# Patient Record
Sex: Female | Born: 1950 | Race: White | Hispanic: No | Marital: Married | Smoking: Never smoker
Health system: Southern US, Community
[De-identification: ages and names within clinical notes are randomized; demographics above are authoritative.]

## PROBLEM LIST (undated history)

## (undated) DIAGNOSIS — G4730 Sleep apnea, unspecified: Secondary | ICD-10-CM

## (undated) DIAGNOSIS — R011 Cardiac murmur, unspecified: Secondary | ICD-10-CM

## (undated) DIAGNOSIS — IMO0002 Reserved for concepts with insufficient information to code with codable children: Secondary | ICD-10-CM

## (undated) DIAGNOSIS — F419 Anxiety disorder, unspecified: Secondary | ICD-10-CM

## (undated) DIAGNOSIS — C569 Malignant neoplasm of unspecified ovary: Secondary | ICD-10-CM

## (undated) DIAGNOSIS — R6889 Other general symptoms and signs: Secondary | ICD-10-CM

## (undated) DIAGNOSIS — E039 Hypothyroidism, unspecified: Secondary | ICD-10-CM

## (undated) DIAGNOSIS — M1990 Unspecified osteoarthritis, unspecified site: Secondary | ICD-10-CM

## (undated) DIAGNOSIS — I10 Essential (primary) hypertension: Secondary | ICD-10-CM

## (undated) DIAGNOSIS — K3532 Acute appendicitis with perforation and localized peritonitis, without abscess: Secondary | ICD-10-CM

## (undated) HISTORY — PX: CARPAL TUNNEL RELEASE: SHX101

## (undated) HISTORY — PX: ABDOMINAL WOUND DEHISCENCE: SHX540

## (undated) HISTORY — PX: KNEE ARTHROSCOPY: SUR90

## (undated) SURGERY — APPENDECTOMY, LAPAROSCOPIC
Anesthesia: General

---

## 1998-03-18 ENCOUNTER — Other Ambulatory Visit: Admission: RE | Admit: 1998-03-18 | Discharge: 1998-03-18 | Payer: Self-pay | Admitting: Obstetrics and Gynecology

## 1998-09-16 ENCOUNTER — Other Ambulatory Visit: Admission: RE | Admit: 1998-09-16 | Discharge: 1998-09-16 | Payer: Self-pay | Admitting: Obstetrics and Gynecology

## 1999-03-30 ENCOUNTER — Other Ambulatory Visit: Admission: RE | Admit: 1999-03-30 | Discharge: 1999-03-30 | Payer: Self-pay | Admitting: Obstetrics and Gynecology

## 2000-01-31 DIAGNOSIS — C569 Malignant neoplasm of unspecified ovary: Secondary | ICD-10-CM

## 2000-01-31 HISTORY — DX: Malignant neoplasm of unspecified ovary: C56.9

## 2000-01-31 HISTORY — PX: ABDOMINAL HYSTERECTOMY: SHX81

## 2000-03-13 ENCOUNTER — Encounter (INDEPENDENT_AMBULATORY_CARE_PROVIDER_SITE_OTHER): Payer: Self-pay | Admitting: Specialist

## 2000-03-13 ENCOUNTER — Other Ambulatory Visit: Admission: RE | Admit: 2000-03-13 | Discharge: 2000-03-13 | Payer: Self-pay | Admitting: Obstetrics and Gynecology

## 2000-04-03 ENCOUNTER — Encounter: Payer: Self-pay | Admitting: *Deleted

## 2000-04-10 ENCOUNTER — Encounter (INDEPENDENT_AMBULATORY_CARE_PROVIDER_SITE_OTHER): Payer: Self-pay

## 2000-04-10 ENCOUNTER — Inpatient Hospital Stay (HOSPITAL_COMMUNITY): Admission: RE | Admit: 2000-04-10 | Discharge: 2000-04-13 | Payer: Self-pay | Admitting: *Deleted

## 2000-04-18 ENCOUNTER — Inpatient Hospital Stay (HOSPITAL_COMMUNITY): Admission: AD | Admit: 2000-04-18 | Discharge: 2000-04-18 | Payer: Self-pay | Admitting: Obstetrics and Gynecology

## 2000-06-06 ENCOUNTER — Encounter: Payer: Self-pay | Admitting: *Deleted

## 2000-06-06 ENCOUNTER — Encounter: Admission: RE | Admit: 2000-06-06 | Discharge: 2000-06-06 | Payer: Self-pay | Admitting: *Deleted

## 2000-09-06 ENCOUNTER — Encounter: Admission: RE | Admit: 2000-09-06 | Discharge: 2000-09-06 | Payer: Self-pay | Admitting: *Deleted

## 2000-09-06 ENCOUNTER — Encounter: Payer: Self-pay | Admitting: *Deleted

## 2000-11-19 ENCOUNTER — Encounter (INDEPENDENT_AMBULATORY_CARE_PROVIDER_SITE_OTHER): Payer: Self-pay | Admitting: Specialist

## 2000-11-19 ENCOUNTER — Ambulatory Visit (HOSPITAL_COMMUNITY): Admission: RE | Admit: 2000-11-19 | Discharge: 2000-11-19 | Payer: Self-pay | Admitting: *Deleted

## 2000-12-11 ENCOUNTER — Encounter: Admission: RE | Admit: 2000-12-11 | Discharge: 2000-12-11 | Payer: Self-pay | Admitting: *Deleted

## 2000-12-11 ENCOUNTER — Encounter: Payer: Self-pay | Admitting: *Deleted

## 2001-04-12 ENCOUNTER — Other Ambulatory Visit: Admission: RE | Admit: 2001-04-12 | Discharge: 2001-04-12 | Payer: Self-pay | Admitting: *Deleted

## 2001-07-24 ENCOUNTER — Ambulatory Visit (HOSPITAL_COMMUNITY): Admission: RE | Admit: 2001-07-24 | Discharge: 2001-07-24 | Payer: Self-pay | Admitting: Gastroenterology

## 2001-10-05 ENCOUNTER — Emergency Department (HOSPITAL_COMMUNITY): Admission: EM | Admit: 2001-10-05 | Discharge: 2001-10-05 | Payer: Self-pay | Admitting: Emergency Medicine

## 2001-10-05 ENCOUNTER — Encounter: Payer: Self-pay | Admitting: Emergency Medicine

## 2001-10-10 ENCOUNTER — Encounter (INDEPENDENT_AMBULATORY_CARE_PROVIDER_SITE_OTHER): Payer: Self-pay | Admitting: *Deleted

## 2001-10-10 ENCOUNTER — Ambulatory Visit (HOSPITAL_BASED_OUTPATIENT_CLINIC_OR_DEPARTMENT_OTHER): Admission: RE | Admit: 2001-10-10 | Discharge: 2001-10-10 | Payer: Self-pay | Admitting: *Deleted

## 2004-12-11 ENCOUNTER — Emergency Department (HOSPITAL_COMMUNITY): Admission: AD | Admit: 2004-12-11 | Discharge: 2004-12-11 | Payer: Self-pay | Admitting: Family Medicine

## 2005-06-14 ENCOUNTER — Emergency Department (HOSPITAL_COMMUNITY): Admission: EM | Admit: 2005-06-14 | Discharge: 2005-06-14 | Payer: Self-pay | Admitting: Family Medicine

## 2007-06-08 ENCOUNTER — Emergency Department (HOSPITAL_COMMUNITY): Admission: EM | Admit: 2007-06-08 | Discharge: 2007-06-08 | Payer: Self-pay | Admitting: Emergency Medicine

## 2007-09-29 ENCOUNTER — Emergency Department (HOSPITAL_COMMUNITY): Admission: EM | Admit: 2007-09-29 | Discharge: 2007-09-29 | Payer: Self-pay | Admitting: Family Medicine

## 2009-10-30 DIAGNOSIS — IMO0002 Reserved for concepts with insufficient information to code with codable children: Secondary | ICD-10-CM

## 2009-10-30 HISTORY — PX: SQUAMOUS CELL CARCINOMA EXCISION: SHX2433

## 2009-10-30 HISTORY — DX: Reserved for concepts with insufficient information to code with codable children: IMO0002

## 2010-06-17 NOTE — Op Note (Signed)
[place]. [facility]  Patient:    Meredith Rose, Meredith Rose Visit Number: [id number] MRN: [id number]          Service Type: END Location: ENDO Attending Physician:  Charna Elizabeth Dictated by:   Anselmo Rod, M.D. Proc. Date: 07/24/01 Admit Date:  07/24/2001   CC:         Talmadge Coventry, M.D.   Operative Report  DATE OF BIRTH:  04/10/1950  REFERRING PHYSICIAN:  Talmadge Coventry, M.D.  PROCEDURE PERFORMED:  Screening colonoscopy.  ENDOSCOPIST:  Anselmo Rod, M.D.  INSTRUMENT USED:  Olympus video colonoscope.  INDICATIONS FOR PROCEDURE:  The patient is a 60 year old white female with a personal with a personal history of ovarian cancer undergoing colonoscopy for a small amount of rectal bleeding, rule out colonic polyps, masses, hemorrhoids,  etc.  PREPROCEDURE PREPARATION:  Informed consent was procured from the patient. The patient was fasted for eight hours prior to the procedure and prepped with a bottle of magnesium citrate and a gallon of NuLytely the night prior to the procedure.  PREPROCEDURE PHYSICAL:  The patient had stable vital signs.  Neck supple. Chest clear to auscultation.  S1, S2 regular.  Abdomen soft with normal bowel sounds.  DESCRIPTION OF PROCEDURE:  The patient was placed in the left lateral decubitus position and sedated with 100 mg of Demerol and 10 mg of Versed intravenously.  Once the patient was adequately sedated and maintained on low-flow oxygen and continuous cardiac monitoring, the Olympus video colonoscope was advanced from the rectum to the cecum and terminal ileum without difficulty.  Except for small nonbleeding internal hemorrhoids no abnormalities were seen.  No masses or polyps.  IMPRESSION:  Normal colonoscopy except for small nonbleeding internal hemorrhoids.  RECOMMENDATIONS: 1. A high fiber diet has been recommended. 2. Repeat colorectal cancer screening is recommended in the next five  years unless the patient develops any abnormal symptoms in the interim. 3. Outpatient follow-up on a p.r.n. basis.Dictated by:   Anselmo Rod, M.D.  Attending Physician:  Charna Elizabeth DD:  07/24/01 TD:  07/25/01 Job: [id number]

## 2010-06-17 NOTE — H&P (Signed)
[facility]  Patient:    Meredith Rose, Meredith Rose                MRN: [id number] Adm. Date:  [id number] Attending:  Esmeralda Arthur Dictator:   Silverio Lay, M.D.                         History and Physical  CONSULTATION REPORT  REASON FOR CONSULTATION:      Increased drainage from wound.  HISTORY OF PRESENT ILLNESS:   This is a 60 year old married white female, patient of Dr. Sung Rose. Meredith Rose, who underwent exploratory laparotomy with TAH, BSO, node dissection and omentectomy for ovarian carcinoma on March 12, at [facility].  She was seen yesterday by Dr. Roslyn Rose in the office for persistent pain in the upper part and right side of her midline incision.  At that visit Dr. Roslyn Rose felt that there might be some cellulitis and the patient was put on Keflex.  Tonight she called very anxious because upon getting up from the sofa she experienced a big gush of drainage from her wound which was watery, red and purulent.  No increased pain, no other symptoms.  She was asked to come to maternity admission for evaluation and was seen by me.  PHYSICAL EXAMINATION:  VITAL SIGNS:                  Reveals normal vital signs, afebrile.  ABDOMEN:                      With a serosanguineous discharge from the upper part of her incision.  Five Steri-Strips were removed.  The wound was cleaned superficially with a mixture of peroxide and warm saline half and half.  She was then infiltrated with 5 cc lidocaine 1% and the incision was reopened on a 4-cm length.  This led to some more drainage of serosanguineous fluid.  We then explored the wound with sterile Q-tips to find a firm and intact fascial layer.  The inside of the wound was then drained as much as possible, irrigated with teroxide and saline and then packed with Kaltostat and redressed with abdominal pad.  LABORATORY DATA:              CBC shows a hemoglobin of 10.7 and a WBC of 14.3.  CONCLUSION:                    1. Probable wound seroma with spontaneous                                  drainage.                               2. No signs of dehiscence at this time.  PLAN:                         The patient is discharged home and is asked to continue her Keflex and will be seen in the office tomorrow where we will plan for wound care with a home health nurse twice a day. DD:  04/19/00 TD:  04/19/00 Job: [id number]

## 2010-06-17 NOTE — H&P (Signed)
[facility]  Patient:    Meredith Rose, Meredith Rose                      MRN: [id number] Adm. Date:  04/09/00 Attending:  Sung Amabile. [name], M.D.                         History and Physical  CHIEF COMPLAINT:  Complex right adnexal mass.  HISTORY OF PRESENT ILLNESS:  60 year old woman, G2, P2, postmenopausal by [facility] in 2000, receiving hormone replacement therapy, admitted for exploratory laparotomy to further evaluate complex right adnexal mass measuring 1.78 by 0.9 by 1.65 cm, discovered incidentally on ultrasound which was originally ordered to evaluate the endometrium after postmenopausal bleeding was noted in February.  CA 125 is 31.4.  There was no free fluid.  The patient has had no bowel complaints, abdominal complaints, GI complaints, no constitutional symptoms.  PAST MEDICAL HISTORY:  Medical hypothyroidism.  PAST SURGICAL HISTORY:  Tubal ligation in 1984, obstetrical vaginal delivery times two.  ALLERGIES:  NONE.  MEDICATIONS: 1. Synthroid 0.112 mg. 2. Prempro 5 mg. 3. Calcium.  FAMILY HISTORY:  Mother and grandmother with history of hypertension.  SOCIAL HISTORY:  Married.  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  Denies tobacco or ethanol use.  PHYSICAL EXAMINATION:  GENERAL:  Healthy appearing woman, moderately obese, afebrile.  VITAL SIGNS:  Blood pressure 126/84.  HEENT:  Within normal limits.  Scleral non-icteric.  NECK:  Supple without thyromegaly.  CHEST:  Clear.  CARDIOVASCULAR:  Regular rate and rhythm, S1, S2 normal.  BREASTS:  Without mass, tenderness, axillary supraclavicular nodes.  ABDOMEN:  Soft and nontender without organomegaly, mass or hernia. well-healed tubal ligation scar.  BACK:  Without CVA tenderness.  GU:  Normal external genitalia.  BUS vagina without lesion.  Uterus anteverted, top normal size, nontender mobile adnexa nonpalpable bilaterally.  RECTOVAGINAL:  Confirmatory.  EXTREMITIES:  Without cyanosis,  clubbing or edema.  SKIN:  Without lesions.  NEUROLOGIC:  Grossly intact.  LABORATORY:  Endometrial biopsy March 12, 2000, showed mucous with scant admixed __________ proliferative endometrium.  Pap smear March 30, 1999, revealed benign reactive reparative changes without atypia.  CA 125 was 31.4 (normal is 0-21).  ASSESSMENT AND PLAN: 1. Right adnexal mass - rule out ovarian pathology - exploratory laparotomy,    TAH/BSO planned.  Possible staging laparotomy if frozen section    intraoperatively suggests malignancy. 2. Hypothyroidism. 3. Status post tubal ligation. 4. Hormone replacement therapy. DD:  04/09/00 TD:  04/09/00 Job: [id number]

## 2010-06-17 NOTE — Discharge Summary (Signed)
[facility]  Patient:    Meredith Rose, Meredith Rose                MRN: [id number] Adm. Date:  [id number] Disc. Date: [id number] Attending:  Silverio Lay A                           Discharge Summary  DISCHARGE DIAGNOSES: 1. 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 3. Endometrial polyp.  OPERATIVE PROCEDURES:  Exploratory laparotomy, total abdominal hysterectomy, bilateral salpingo-oophorectomy, pelvic and periaortic lymph node dissection, peritoneal biopsies and hemiomentectomy on April 10, 2000.  HISTORY:  A 60 year old woman, G2, P2, postmenopausal by [facility] in 2000, receiving hormone replacement therapy admitted for exploratory laparotomy to further evaluate a complex right adnexal mass measuring 1.78 x .9 x 1.65, discovered incidentally on ultrasound which was originally performed to evaluate the endometrium after postmenopausal bleeding was noted in February. CA-125 was 31.4.  There was no free fluid.  HOSPITAL COURSE:  The patient was admitted to [facility] after bowel prep and taken to the operating room where she underwent exploratory laparotomy, total abdominal hysterectomy, bilateral salpingo-oophorectomy and, after a borderline serous tumor was identified on frozen section, pelvic and periaortic lymph node dissection, hemiomentectomy, and peritoneal biopsies were also performed.  Estimated blood loss was 500 cc, and there were no complications.  The patients postoperative course was unremarkable.  Jackson-Pratt drain was removed on the 5th postoperative day.  Hemoglobin stabilized at 11.3.  Her diet was advanced without difficulties, and her analgesia changed from IV to oral medications.  NG tube was kept in until she was able to tolerate fluids.  She was discharged to home on the 3rd postoperative day in satisfactory condition.  She was given routine status post exploratory  laparotomy instructions.  Final pathology pending at the time of discharge.  DISCHARGE MEDICATIONS: 1. Synthroid 0.112 mg daily. 2. Toradol 10 mg q.6h. 3. Tylox 1-2 p.o. q.4h. p.r.n. pain. 4. Multiple vitamin. 5. Vivelle dot 0.05 mg patch, change twice weekly.  She will be followed up by Dr. [name] in one week. DD:  05/10/00 TD:  05/10/00 Job: [id number]

## 2010-06-17 NOTE — Op Note (Signed)
[facility]  Patient:    Meredith Rose, Meredith Rose                MRN: [id number] Proc. Date: 04/10/00 Adm. Date:  [id number] Attending:  Ardeen Fillers CC:         Sung Amabile. [name], M.D.   Operative Report  PROBLEM:  Bilateral serous borderline ovarian tumors.  OPERATIVE FINDINGS:  At the time that I entered the operating room, Dr. [name] had already collected peritoneal washings. There was no evidence of ascites. There was a small implant on the anterior surface of the uterine fundus. The left ovary was approximately 2 cm to 3 cm in size with multiple small fibrous like nodules covering the surface. It was not adherent to the side wall. The right ovary was approximately 4 cm in size and was cystic in structure. It was not adherent to the side wall and it had no external excrescence. There was a small ecchymotic appearing implant on the medial surface of the rectosigmoid, this was submitted as a biopsy. There were no peritoneal implants in running the bowel from the ileocecal valve to the ligament of Treitz. The omentum was very fatty but there was no evidence of implant. There was a 2 cm bilobe cyst arising on the superior surface of the left lobe of the liver. This was very close to the falciform ligament and was able to be palpated through the surface of the liver as a cystic lesion. On visualization, it looked like a blue domed cyst. There were no palpable lymph nodes in the periaortic or pelvic areas.  DESCRIPTION OF PROCEDURE:  With Dr. Cherlynn Perches assistance for a total abdominal hysterectomy, bilateral salpingo-oophorectomy. Once pathology returned consistent with a borderline ovarian tumor we proceeded with a staging procedure for this malignancy. First the abdominal wall incision was extended around the right side of the umbilicus. The subcutaneous tissues and fascia were divided. A Buchwalter retractor was used at this point for  our exposure. A full exploratory laparotomy was performed. The diaphragms and surfaces of the liver were palpated. The bowel was run from the ligament of Treitz to the ileocecal valve. The walls of the rectosigmoid and ascending colon and mid portion of the transverse colon were completely visualized without any evidence of implants. This was not possible to do on the splenic or hepatic flexure. The left lobe of the liver was carefully visualized. It was very difficult to see therefore we got a head lamp so that we could shine light on the area. In palpating this, this appears like a bilobed blue dome cyst.  We began the staging procedure by excising two strips of peritoneum, one from the pelvis along the peritoneal edge between the round ligament and the IP ligament. Next a length approximately 4 cm long was incised from the gutter. This was performed on both sides. There was on need for cautery or suture of these excision sites. The periaortic and pelvic node dissections followed. By incising the peritoneum and each gutter along the white line of Toldt, the rectosigmoid then cecum were reflected medially. I will describe the dissection first on the patients left. With the rectosigmoid reflected medially, the common iliac artery was identified. Tracing it superiorly to the bifurcation, a fat pad along the aorta was palpated. There were no discreet lymph nodes in this area. Starting at the top of the common iliac artery, Metzenbaum scissors were used to separate the fat pad from the lower aorta. Small clips  were placed along the aorta when small bleeders were encountered. This fat pad was very soft and easily transected. I went to the base of it taking care not to get into the sympathetic chain. Using large clips, the base of this fat pad was excised. We extended this dissection up until just beneath the inferior mesenteric artery. There was no active bleeding in this site, clips had been  placed previously when bleeders were encountered. Next we went down the external iliac artery starting at the common iliac and taking the lymph nodes off of the lateral and superior surface of the external iliac artery. The artery was separated from the psoas muscle taking care not to disturb the genitofemoral nerve or the obturator nerve. Once the artery had been carefully removed from the psoas, I was able to palpate deep into the obturator space. There were no discreet nodes palpable. The medial surface going down on to the hypogastric artery was cleansed of lymph nodes. We took this lymph node dissection down to the circumflex vein. Once this dissection had been completed, a lap was placed in this area while we went to the patients right.  With the ascending colon reflected medially, the common iliac artery was identified and the fat pad lateral to the superior common iliac artery extending up onto the lower aorta was cleansed of a fat pad. This was separated from the underlying inferior vena cava using small clips until we reached the level of the inferior mesenteric artery. There was no active bleeding with this dissection. Next, we turned south on the external iliac artery by beginning our dissection on the common iliac artery. All of the lymph nodes lateral and on the surface of the artery were excised. Clips were applied where appropriate. The artery was separated from the side wall from the psoas muscle and in palpating the obturator space, no lymph nodes were noted. The artery on this side was separated from the vein so that nodes lateral to the artery could be removed. There was on significant active bleeding. Jackson-Pratt drains were then placed through the lower abdomen using a stab incision. These were round size 7 Jackson-Pratt drains. They were layered up into the periaortic area on each side exiting through the stab wounds. Next, the rectosigmoid was layered back down  into the pelvis and small bowel was carefully placed on top of this. The omentum was drawn down and  using a series of Kelly clamps, the distal omentum was removed. Then #0 Vicryl ties were used to secure these pedicles.  Once we had completed this dissection, the anterior abdominal wall was closed by Dr. [name] using a running #0 Prolene suture bearing stitches in the midline after they were tied one to another. The subcutaneous tissues were closed after irrigation. Skin staples were applied. DD:  04/10/00 TD:  04/11/00 Job: [id number]

## 2010-06-17 NOTE — Op Note (Signed)
   NAME:  Meredith Rose, Meredith Rose                       ACCOUNT NO.:  [id number]   MEDICAL RECORD NO.:  [id number]                   PATIENT TYPE:  AMB   LOCATION:  DSC                                  FACILITY:  [facility]   PHYSICIAN:  Vikki Ports, M.D.         DATE OF BIRTH:  1950/08/19   DATE OF PROCEDURE:  10/10/2001  DATE OF DISCHARGE:                                 OPERATIVE REPORT   PREOPERATIVE DIAGNOSES:  Chronic sinus tract of abdominal wound.   POSTOPERATIVE DIAGNOSES:  Chronic sinus tract of abdominal wound.   OPERATION PERFORMED:  Wide excision of chronic abdominal wound.   SURGEON:  Vikki Ports, M.D.   ANESTHESIA:  General.   DESCRIPTION OF PROCEDURE:  The patient was taken to the operating room and  placed in supine position.  After adequate  anesthesia was induced, using  laryngeal mask, the abdomen was prepped and draped in the normal sterile  fashion.  A lacrimal tear duct probe was introduced into the sinus tract and  extended directly posteriorly.  An elliptical incision was made around the  hypertrophied granulation tissue of the midline wound.  I dissected all the  way down to the fascia excising all tissue down to the fascia.  The nidus of  granulation tissue appeared to start at the knot of the Novofil suture.  This was removed.  The entire tract was removed.  The wound was packed then  with Betadine soaked Kerlix gauze.  The fascia was intact.  A sterile  dressing was applied.  The patient tolerated the procedure well and went to  PACU in good condition.                                                Vikki Ports, M.D.    KRH/MEDQ  D:  10/10/2001  T:  10/10/2001  Job:  [id number]   cc:   Pershing Cox, M.D.  [address]  [address]  [place]  [place] [address]  Fax: [phone]

## 2010-06-17 NOTE — Op Note (Signed)
[facility]  Patient:    Meredith Rose, Meredith Rose                MRN: [id number] Proc. Date: 04/10/00 Adm. Date:  [id number] Attending:  Ardeen Fillers                           Operative Report  INDICATIONS:  A 60 year old woman, postmenopausal, with complex right ovarian cyst identified on recent ultrasound, which was ordered to evaluate postmenopausal bleeding.  Her CA-125 was 31 (normal 0-25).  The patient is admitted now for exploratory laparotomy, total abdominal hysterectomy, and bilateral salpingo-oophorectomy.  PREOPERATIVE DIAGNOSIS:  Right ovarian cyst, elevated CA-125.  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  PROCEDURE PERFORMED:  Total abdominal hysterectomy and bilateral salpingo-oophorectomy (staging laparotomy by Dr. Carey Bullocks).  SURGEON:  Sung Amabile. Sherron Monday, M.D.  ASSISTANT:  Pershing Cox, M.D.  ANESTHESIA:  General with endotracheal tube intubation.  ESTIMATED BLOOD LOSS:  500 cc.  TUBES/DRAINS:  Foley catheter and bilateral retroperitoneal Jackson-Pratt drains.  COMPLICATIONS:  None.  FINDINGS:  The right ovary with a 4 cm cyst.  The left ovary with small excrescences over the serosal surface.  Neither ovary was adhesed.  Top normal size uterus.  Normal appearing appendix.  The liver with palpable 2 cm multilobe cyst near the falciform ligament consistent with a blue gum cyst. No evidence of peritoneal tumor.  A 1 mm rectosigmoid serosal lesion excised. The bowel otherwise normal in appearance.  SPECIMENS:  Pelvic washings, bilateral tubes and ovaries, and uterus as well as peritoneal periaortic, pelvic node, and omental biopsies sent to pathology.  DESCRIPTION OF PROCEDURE:  After the establishment of general anesthesia, the patients abdomen and vagina were prepped.  A Foley catheter was placed.  She was placed in the supine position.  She was draped.  A vertical  low abdominal incision was made on the skin with the knife.  The incision was carried to the level of the fascia using electrocautery.  The fascia was nicked in the midline and the fascial incision was carried superiorly and inferiorly with scissors.  The rectus muscle bellies were split in the midline.  The peritoneum was visualized, grasped in a clear space, and entered there carefully.  The peritoneal incision was extended slightly.  A total of 500 cc of warm normal saline were instilled to the pelvic cavity. This fluid was evacuated by pool suction and sent for cytology.  The peritoneal incision was carried superiorly and inferiorly taking care to avoid underlying viscera.  The upper abdomen was explored.  A self-retaining retractor was placed and the bowel was packed carefully with moist sponges.  Kelly clamps were placed over the tubes and utero-ovarian ligaments bilaterally close to the uterus.  Unless otherwise noted, 0 Vicryl suture material was used.  The round ligaments were identified, clamped, transfixed, and transected.  The anterior leaflet of the broad ligament was opened over the lower uterine segment.  The bladder flap was created with a combination of sharp and blunt dissection.  Attention was first drawn to the right adnexa.  The middle leaflet of the right broad ligament was opened.  The ureters course was identified.  The right infundibulopelvic ligament was isolated and doubly clamped well above the right ureter.  The infundibulopelvic ligament was transected and doubly ligated first with a free tie and then suture ligature.  The right tube and ovary were then transected  and handed off the table.  Attention was then drawn to the left adnexa.  Small excrescences were seen on the left ovary, which as normal in size.  The middle leaflet of the left broad ligament was opened and the ureters course was identified.  The left infundibulopelvic ligament was isolated,  doubly clamped, transected, and doubly ligated first with a free tie and then suture ligature.  The left tube and utero-ovarian ligament were transected and the left tube and ovary were then handed off the table and sent for frozen section as well.  The uterine vessels were skeletonized sharply.  The uterine arteries were clamped at the cervical fundal junction.  These pedicles were transected and then suture ligated.  Serial parametrial bites were taken.  Each of these pedicles was clamped, transected, and then suture ligated.  The cervix was taken off of the vagina at the cervical vaginal junction.  The uterus was handed off the table.  Angle sutures incorporating the ipsilateral cardinal ligaments were placed bilaterally.  The uterosacral ligaments which were isolated, transected, and transfixed earlier were tied into their ipsilateral vaginal cuff suture.  The remainder of the open cuff was secured with two figure-of-eight sutures of 0 Vicryl.  Hemostasis was noted at the cuff.  A small bleeding area from the right infundibulopelvic region was identified. This was controlled with a suture ligature.  During the procedure, attention was drawn to the ureters courses.  Notification came that the ovaries demonstrated a borderline serous cystadenocarcinoma.  The decision to proceed with a staging laparotomy was made.  The incision was extended and Bookwalter retractor replaced the previous Engineer, manufacturing.  Dr. Walker Shadow dictated note delineates the details of this staging procedure.  At the end of the staging procedure, all packs and instruments were removed. Initial sponge, needle, and blade counts were correct.  The rectus sigmoid was placed in the pelvis.  The omentum was advanced over the bowels.  The surgical site was irrigated with warm normal saline.  The anterior abdominal wall was closed in layers.  A bulk closure using a running stitch of double-stranded Prolene  tying in the midline and burying the knot was used. That was irrigated with warm normal saline.  Electrocautery was used for  hemostasis.  Marcaine 0.25% was used to infiltrate the skin edges.  The skin was closed with staples.  Jackson-Pratt drains had been placed prior to closure of the abdominal wall - please see Dr. Theodosia Blender note.  Final sponge, needle, and blade counts were correct.  The urine was clear at the end of the case.  The patient was extubated without difficulty and transported to the recovery room in satisfactory condition. DD:  04/10/00 TD:  04/11/00 Job: [id number]

## 2010-06-17 NOTE — Op Note (Signed)
[facility]  Patient:    Meredith Rose, Meredith Rose Visit Number: [id number] MRN: [id number]          Service Type: DSU Location: DAY Attending Physician:  Marin Comment Dictated by:   Pershing Cox, M.D. Proc. Date: 11/19/00 Admit Date:  11/19/2000                             Operative Report  PREOPERATIVE DIAGNOSIS:  Ventral wound sinus tract with mucopurulent drainage.  POSTOPERATIVE DIAGNOSIS:  Ventral wound sinus tract.  PROCEDURES PERFORMED:  Excision of sinus tract with curettage of base of tract.  SURGEON:  Pershing Cox, M.D.  ANESTHESIA:  MAC plus 1/2% Marcaine local.  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 She has had wound healing problems but this was in an area up around her umbilicus. She did present in September with a drainage site from her wound thought to be a stitch granuloma. She was treated with antibiotics and had some improvement. She presented again on August 14th at which time she had a 2 cm raised lesion in the mid portion of the ventral incision. It was about 1.5 cm in diameter. She was able to tolerate probing of the incision and no foreign body was found. Kaltostat was inserted into the wound and she was given additional Kaltostat and instruments and her husband packed the incision over a four day period. She was seen in the office again on Friday, the 18th and at that time there was no improvement in her wound. She continued to drain a mucopurulent material. For this reason she is brought to the operating room today.  INTRAOPERATIVE FINDINGS:  The patients wound had closed significantly. On pressure around the wound a mucopurulent material was extruded. The sinus tract was incised and was approximately 3 cm in length and went between the fascial  edges. The tract was probed carefully beneath the fascia and there was no passage into the peritoneal cavity or into any area suggestive of bowel. It was removed to just below the fascia and then the fascia was closed over the base.  DESCRIPTION OF PROCEDURE:  The patient was brought to the operating room with an IV in place. She was placed supine on the OR table and IV sedation was administered. The anterior abdominal wall was prepped with a solution of Hibiclens and she was draped for a midline incision.  Marcaine 0.50% was injected to the perisinus tract area infiltrating both deep and subcutaneous tissues; 20 cc of 0.50% were used. A scalpel was used to excise an elliptical incision around the edges of the sinus tract. The sinus tract itself was approximately 2 cm broad in each direction with rolled edges. Holding the sinus tract in an upright position, cautery was used to take it down to the base and then Metzenbaum scissors were used to help dissect the tract. Careful dissection was performed circumferentially around the base until we reached the fascia. The fascia was lifted on both sides and the dissection continued using sharp dissection to carry me down as far as I could to the base of the sinus tract. The edges of the sinus tract were everted and a Kelly clamp was placed through the tract. It did not descend further than a 1/4 of an inch. With  this information, a decision was made to excise the tract just below the level of the fascia. Prior to doing this, #1 bone curet was used to curet the edges of the tract and cautery was used to cauterize the edges of the tract. I did not take the cautery down into the base as I thought it might be adherent to a portion of bowel. The sinus tract was then allowed to drop down into the peritoneal cavity and the fascia was closed with interrupted sutures of #0 Vicryl. 2-0 Monocryl was used to bring the subcuticular tissue together in a single  stitch and then a subcuticular was used to close the skin edges and then Steri-Strips were applied to the incision. The patient tolerated the procedure well and was taken to the recovery room and was discharged from the recovery room. Dictated by:   Pershing Cox, M.D. Attending Physician:  Marin Comment DD:  11/19/00 TD:  11/20/00 Job: [id number]

## 2010-07-16 ENCOUNTER — Inpatient Hospital Stay (INDEPENDENT_AMBULATORY_CARE_PROVIDER_SITE_OTHER)
Admission: RE | Admit: 2010-07-16 | Discharge: 2010-07-16 | Disposition: A | Discharge status: Home / Self Care | Payer: BC Managed Care – PPO | Source: Ambulatory Visit | Attending: Family Medicine | Admitting: Family Medicine

## 2010-07-16 DIAGNOSIS — H53459 Other localized visual field defect, unspecified eye: Secondary | ICD-10-CM

## 2010-11-28 ENCOUNTER — Inpatient Hospital Stay (INDEPENDENT_AMBULATORY_CARE_PROVIDER_SITE_OTHER)
Admission: RE | Admit: 2010-11-28 | Discharge: 2010-11-28 | Disposition: A | Discharge status: Home / Self Care | Payer: BC Managed Care – PPO | Source: Ambulatory Visit | Attending: Family Medicine | Admitting: Family Medicine

## 2010-11-28 DIAGNOSIS — J019 Acute sinusitis, unspecified: Secondary | ICD-10-CM

## 2010-11-28 DIAGNOSIS — J310 Chronic rhinitis: Secondary | ICD-10-CM

## 2010-12-31 HISTORY — PX: KNEE ARTHROSCOPY: SHX127

## 2011-02-07 ENCOUNTER — Encounter: Payer: Self-pay | Admitting: *Deleted

## 2011-02-07 DIAGNOSIS — S90569A Insect bite (nonvenomous), unspecified ankle, initial encounter: Secondary | ICD-10-CM | POA: Insufficient documentation

## 2011-02-07 DIAGNOSIS — E079 Disorder of thyroid, unspecified: Secondary | ICD-10-CM | POA: Insufficient documentation

## 2011-02-07 DIAGNOSIS — Z79899 Other long term (current) drug therapy: Secondary | ICD-10-CM | POA: Insufficient documentation

## 2011-02-07 DIAGNOSIS — I10 Essential (primary) hypertension: Secondary | ICD-10-CM | POA: Insufficient documentation

## 2011-02-07 DIAGNOSIS — F411 Generalized anxiety disorder: Secondary | ICD-10-CM | POA: Insufficient documentation

## 2011-02-07 NOTE — ED Notes (Signed)
Tick bite to left hip area. Pt tried to remove tick earlier but was not able to remove the head.

## 2011-02-08 ENCOUNTER — Emergency Department (HOSPITAL_BASED_OUTPATIENT_CLINIC_OR_DEPARTMENT_OTHER)
Admission: EM | Admit: 2011-02-08 | Discharge: 2011-02-08 | Disposition: A | Discharge status: Home / Self Care | Payer: BC Managed Care – PPO | Attending: Emergency Medicine | Admitting: Emergency Medicine

## 2011-02-08 DIAGNOSIS — W57XXXA Bitten or stung by nonvenomous insect and other nonvenomous arthropods, initial encounter: Secondary | ICD-10-CM

## 2011-02-08 HISTORY — DX: Anxiety disorder, unspecified: F41.9

## 2011-02-08 HISTORY — DX: Essential (primary) hypertension: I10

## 2011-02-08 MED ORDER — DOXYCYCLINE HYCLATE 100 MG PO CAPS: 100.0000 mg | ORAL_CAPSULE | Freq: Two times a day (BID) | ORAL | Status: AC

## 2011-02-08 NOTE — ED Notes (Signed)
MD at bedside. 

## 2011-02-08 NOTE — ED Notes (Signed)
See downtime forms for pt assessment and txs.

## 2011-02-08 NOTE — ED Provider Notes (Signed)
History    60yf with tick bite L thigh. Noticed shortly before arrival. Not sure how long there. Removed but thinks part of it still remains. Denies significant outdoor activity but has dogs. Rash around area. Doesn't hurt. No drainage. No fever or chills. No dizziness, lightheadedness or sob. Denies joint pain. No ha. No numbness, tingling or loss of strength.  CSN: [id number]  Arrival date & time 02/07/11  2256   First MD Initiated Contact with Patient 02/08/11 0308      Chief Complaint  Patient presents with  . Insect Bite    (Consider location/radiation/quality/duration/timing/severity/associated sxs/prior treatment) HPI  Past Medical History  Diagnosis Date  . Hypertension   . Thyroid disease   . Anxiety     Past Surgical History  Procedure Date  . Right knee arthroplasty     History reviewed. No pertinent family history.  History  Substance Use Topics  . Smoking status: Never Smoker   . Smokeless tobacco: Not on file  . Alcohol Use: No    OB History    Grav Para Term Preterm Abortions TAB SAB Ect Mult Living                  Review of Systems   Review of symptoms negative unless otherwise noted in HPI.  Allergies  Review of patient's allergies indicates no known allergies.  Home Medications   Current Outpatient Rx  Name Route Sig Dispense Refill  . ACETAMINOPHEN 500 MG PO TABS Oral Take 1,000 mg by mouth every 6 (six) hours as needed. For pain     . ASPIRIN 81 MG PO TABS Oral Take 81 mg by mouth daily.      . CELECOXIB 200 MG PO CAPS Oral Take 200 mg by mouth daily.      Marland Kitchen VITAMIN D 2000 UNITS PO TABS Oral Take 2,000 Units by mouth daily.      Marland Kitchen HYDROCODONE-ACETAMINOPHEN 10-325 MG PO TABS Oral Take 1 tablet by mouth every 6 (six) hours as needed. For pain     . LEVOTHYROXINE SODIUM 112 MCG PO TABS Oral Take 112 mcg by mouth daily.      Marland Kitchen LOSARTAN POTASSIUM-HCTZ 100-25 MG PO TABS Oral Take 1 tablet by mouth daily.      . ADULT MULTIVITAMIN W/MINERALS  CH Oral Take 1 tablet by mouth daily.      Marland Kitchen PROPRANOLOL HCL 10 MG PO TABS Oral Take 10 mg by mouth daily.      Marland Kitchen ZOLOFT PO Oral Take 1 tablet by mouth daily.      Marland Kitchen DOXYCYCLINE HYCLATE 100 MG PO CAPS Oral Take 1 capsule (100 mg total) by mouth 2 (two) times daily. 28 capsule 0    BP 135/56  Pulse 58  Temp(Src) 98.6 F (37 C) (Oral)  Resp 19  Ht 5\' 5"  (1.651 m)  Wt 212 lb (96.163 kg)  BMI 35.28 kg/m2  SpO2 96%  Physical Exam  Nursing note and vitals reviewed. Constitutional: No distress.       Sitting in bed. Nad. Obese.  HENT:  Head: Normocephalic and atraumatic.  Eyes: Conjunctivae are normal. Pupils are equal, round, and reactive to light. Right eye exhibits no discharge. Left eye exhibits no discharge.  Neck: Normal range of motion. Neck supple.  Cardiovascular: Normal rate, regular rhythm and normal heart sounds.  Exam reveals no gallop and no friction rub.   No murmur heard. Pulmonary/Chest: Effort normal and breath sounds normal. No respiratory distress.  Musculoskeletal:  She exhibits no edema and no tenderness.  Lymphadenopathy:    She has no cervical adenopathy.  Neurological: She is alert.  Skin: Skin is warm and dry. She is not diaphoretic.       L lateral proximal thigh with circular erythematous circular rash. ~4cm in diameter. Blanches. Not tender. No drainage. Center of lesions with dark debris consistent with possible mouth parts of tick. No center clearing of rash. Neurovascularly intact distally.  Psychiatric: She has a normal mood and affect. Her behavior is normal. Thought content normal.    ED Course  Procedures (including critical care time)  Labs Reviewed - No data to display No results found.   1. Tick bite       MDM  60yF with tick bite L lateral thigh. Remaining mouth parts removed. Pt does has a surrounding erythematous rash. Not classic for erythema migrans but pt concerned for lyme. Pt is not sure how long tick embedded. Suspect more  likely just local reaction but treating for possible lyme or early cellulitis.        Raeford Razor, MD 02/15/11 1349

## 2011-06-03 DIAGNOSIS — K3532 Acute appendicitis with perforation and localized peritonitis, without abscess: Secondary | ICD-10-CM

## 2011-06-03 HISTORY — DX: Acute appendicitis with perforation, localized peritonitis, and gangrene, without abscess: K35.32

## 2011-06-05 ENCOUNTER — Encounter (HOSPITAL_COMMUNITY): Payer: Self-pay

## 2011-06-05 ENCOUNTER — Encounter (HOSPITAL_COMMUNITY): Admission: EM | Disposition: A | Discharge status: Home / Self Care | Payer: Self-pay | Source: Home / Self Care

## 2011-06-05 ENCOUNTER — Encounter (HOSPITAL_COMMUNITY): Payer: Self-pay | Admitting: *Deleted

## 2011-06-05 ENCOUNTER — Emergency Department (INDEPENDENT_AMBULATORY_CARE_PROVIDER_SITE_OTHER)
Admission: EM | Admit: 2011-06-05 | Discharge: 2011-06-05 | Disposition: A | Discharge status: Nursing Home (Includes ICF) | Payer: BC Managed Care – PPO | Source: Home / Self Care | Attending: Emergency Medicine | Admitting: Emergency Medicine

## 2011-06-05 ENCOUNTER — Emergency Department (HOSPITAL_COMMUNITY): Payer: BC Managed Care – PPO

## 2011-06-05 ENCOUNTER — Inpatient Hospital Stay (HOSPITAL_COMMUNITY)
Admission: EM | Admit: 2011-06-05 | Discharge: 2011-06-12 | DRG: 164 | Disposition: A | Discharge status: Home / Self Care | Payer: BC Managed Care – PPO | Attending: General Surgery | Admitting: General Surgery

## 2011-06-05 ENCOUNTER — Inpatient Hospital Stay (HOSPITAL_COMMUNITY): Payer: BC Managed Care – PPO | Admitting: Certified Registered"

## 2011-06-05 ENCOUNTER — Encounter (HOSPITAL_COMMUNITY): Payer: Self-pay | Admitting: Certified Registered"

## 2011-06-05 DIAGNOSIS — F419 Anxiety disorder, unspecified: Secondary | ICD-10-CM | POA: Diagnosis present

## 2011-06-05 DIAGNOSIS — R109 Unspecified abdominal pain: Secondary | ICD-10-CM

## 2011-06-05 DIAGNOSIS — K560 Paralytic ileus: Secondary | ICD-10-CM | POA: Diagnosis not present

## 2011-06-05 DIAGNOSIS — K35209 Acute appendicitis with generalized peritonitis, without abscess, unspecified as to perforation: Principal | ICD-10-CM | POA: Diagnosis present

## 2011-06-05 DIAGNOSIS — K3532 Acute appendicitis with perforation and localized peritonitis, without abscess: Secondary | ICD-10-CM | POA: Diagnosis present

## 2011-06-05 DIAGNOSIS — K3580 Unspecified acute appendicitis: Secondary | ICD-10-CM

## 2011-06-05 DIAGNOSIS — Z90710 Acquired absence of both cervix and uterus: Secondary | ICD-10-CM

## 2011-06-05 DIAGNOSIS — S36509A Unspecified injury of unspecified part of colon, initial encounter: Secondary | ICD-10-CM

## 2011-06-05 DIAGNOSIS — K352 Acute appendicitis with generalized peritonitis, without abscess: Principal | ICD-10-CM | POA: Diagnosis present

## 2011-06-05 DIAGNOSIS — Z5331 Laparoscopic surgical procedure converted to open procedure: Secondary | ICD-10-CM

## 2011-06-05 DIAGNOSIS — R0902 Hypoxemia: Secondary | ICD-10-CM | POA: Diagnosis not present

## 2011-06-05 DIAGNOSIS — E039 Hypothyroidism, unspecified: Secondary | ICD-10-CM | POA: Diagnosis present

## 2011-06-05 DIAGNOSIS — K929 Disease of digestive system, unspecified: Secondary | ICD-10-CM | POA: Diagnosis not present

## 2011-06-05 DIAGNOSIS — F411 Generalized anxiety disorder: Secondary | ICD-10-CM | POA: Diagnosis present

## 2011-06-05 DIAGNOSIS — I10 Essential (primary) hypertension: Secondary | ICD-10-CM | POA: Diagnosis present

## 2011-06-05 HISTORY — DX: Unspecified osteoarthritis, unspecified site: M19.90

## 2011-06-05 HISTORY — DX: Malignant neoplasm of unspecified ovary: C56.9

## 2011-06-05 HISTORY — DX: Hypothyroidism, unspecified: E03.9

## 2011-06-05 HISTORY — PX: APPENDECTOMY: SHX54

## 2011-06-05 HISTORY — DX: Acute appendicitis with perforation and localized peritonitis, without abscess: K35.32

## 2011-06-05 LAB — DIFFERENTIAL
Basophils Relative: 0 % (ref 0–1)
Eosinophils Absolute: 0.1 10*3/uL (ref 0.0–0.7)
Eosinophils Relative: 0 % (ref 0–5)
Lymphs Abs: 1.7 10*3/uL (ref 0.7–4.0)
Monocytes Absolute: 1.2 10*3/uL — ABNORMAL HIGH (ref 0.1–1.0)
Monocytes Relative: 6 % (ref 3–12)
Neutrophils Relative %: 84 % — ABNORMAL HIGH (ref 43–77)

## 2011-06-05 LAB — CBC
HCT: 37.9 % (ref 36.0–46.0)
MCH: 29.1 pg (ref 26.0–34.0)
MCV: 84.2 fL (ref 78.0–100.0)
Platelets: 291 10*3/uL (ref 150–400)
RBC: 4.5 MIL/uL (ref 3.87–5.11)
WBC: 19.8 10*3/uL — ABNORMAL HIGH (ref 4.0–10.5)

## 2011-06-05 LAB — URINALYSIS, ROUTINE W REFLEX MICROSCOPIC
Glucose, UA: NEGATIVE mg/dL
Ketones, ur: 15 mg/dL — AB
Protein, ur: 30 mg/dL — AB
pH: 6 (ref 5.0–8.0)

## 2011-06-05 LAB — POCT URINALYSIS DIP (DEVICE)
Ketones, ur: NEGATIVE mg/dL
Nitrite: POSITIVE — AB
Protein, ur: 100 mg/dL — AB

## 2011-06-05 LAB — BASIC METABOLIC PANEL
BUN: 11 mg/dL (ref 6–23)
CO2: 29 mEq/L (ref 19–32)
Calcium: 9.9 mg/dL (ref 8.4–10.5)
Chloride: 98 mEq/L (ref 96–112)
Creatinine, Ser: 0.81 mg/dL (ref 0.50–1.10)
Glucose, Bld: 120 mg/dL — ABNORMAL HIGH (ref 70–99)

## 2011-06-05 LAB — URINE MICROSCOPIC-ADD ON

## 2011-06-05 SURGERY — APPENDECTOMY
Anesthesia: General | Site: Abdomen | Wound class: Dirty or Infected

## 2011-06-05 MED ORDER — ACETAMINOPHEN 325 MG PO TABS
ORAL_TABLET | ORAL | Status: AC
  Filled 2011-06-05: qty 3

## 2011-06-05 MED ORDER — BUPIVACAINE-EPINEPHRINE 0.25% -1:200000 IJ SOLN
INTRAMUSCULAR | Status: DC | PRN
  Administered 2011-06-05: 20 mL

## 2011-06-05 MED ORDER — SODIUM CHLORIDE 0.9 % IV SOLN
3.0000 g | Freq: Once | INTRAVENOUS | Status: DC
  Filled 2011-06-05: qty 3

## 2011-06-05 MED ORDER — LACTATED RINGERS IV SOLN
INTRAVENOUS | Status: DC | PRN
  Administered 2011-06-05 (×3): via INTRAVENOUS

## 2011-06-05 MED ORDER — SODIUM CHLORIDE 0.9 % IR SOLN
Status: DC | PRN
  Administered 2011-06-05: 1000 mL

## 2011-06-05 MED ORDER — DEXAMETHASONE SODIUM PHOSPHATE 4 MG/ML IJ SOLN
INTRAMUSCULAR | Status: DC | PRN
  Administered 2011-06-05: 4 mg via INTRAVENOUS

## 2011-06-05 MED ORDER — MIDAZOLAM HCL 5 MG/5ML IJ SOLN
INTRAMUSCULAR | Status: DC | PRN
  Administered 2011-06-05: 2 mg via INTRAVENOUS

## 2011-06-05 MED ORDER — ONDANSETRON HCL 4 MG/2ML IJ SOLN: 4.0000 mg | Freq: Four times a day (QID) | INTRAMUSCULAR | Status: DC | PRN

## 2011-06-05 MED ORDER — SUCCINYLCHOLINE CHLORIDE 20 MG/ML IJ SOLN
INTRAMUSCULAR | Status: DC | PRN
  Administered 2011-06-05: 120 mg via INTRAVENOUS

## 2011-06-05 MED ORDER — SODIUM CHLORIDE 0.9 % IV BOLUS (SEPSIS)
1000.0000 mL | Freq: Once | INTRAVENOUS | Status: AC
  Administered 2011-06-05: 1000 mL via INTRAVENOUS

## 2011-06-05 MED ORDER — SODIUM CHLORIDE 0.9 % IV SOLN
3.0000 g | Freq: Four times a day (QID) | INTRAVENOUS | Status: DC
  Administered 2011-06-05: 3 g via INTRAVENOUS
  Filled 2011-06-05 (×3): qty 3

## 2011-06-05 MED ORDER — KCL IN DEXTROSE-NACL 20-5-0.45 MEQ/L-%-% IV SOLN
INTRAVENOUS | Status: DC
  Administered 2011-06-06: 125 mL/h via INTRAVENOUS
  Administered 2011-06-06 – 2011-06-11 (×14): via INTRAVENOUS
  Filled 2011-06-05 (×19): qty 1000

## 2011-06-05 MED ORDER — HYDROMORPHONE HCL PF 1 MG/ML IJ SOLN: 1.0000 mg | INTRAMUSCULAR | Status: DC | PRN

## 2011-06-05 MED ORDER — PANTOPRAZOLE SODIUM 40 MG IV SOLR
40.0000 mg | Freq: Every day | INTRAVENOUS | Status: DC
  Administered 2011-06-06 – 2011-06-08 (×3): 40 mg via INTRAVENOUS
  Filled 2011-06-05 (×7): qty 40

## 2011-06-05 MED ORDER — SODIUM CHLORIDE 0.9 % IV SOLN: 3.0000 g | INTRAVENOUS | Status: DC

## 2011-06-05 MED ORDER — EPHEDRINE SULFATE 50 MG/ML IJ SOLN
INTRAMUSCULAR | Status: DC | PRN
  Administered 2011-06-05: 10 mg via INTRAVENOUS

## 2011-06-05 MED ORDER — PROPOFOL 10 MG/ML IV EMUL
INTRAVENOUS | Status: DC | PRN
  Administered 2011-06-05: 200 mg via INTRAVENOUS

## 2011-06-05 MED ORDER — ACETAMINOPHEN 325 MG PO TABS
975.0000 mg | ORAL_TABLET | Freq: Once | ORAL | Status: AC
  Administered 2011-06-05: 975 mg via ORAL

## 2011-06-05 MED ORDER — SERTRALINE HCL 20 MG/ML PO CONC
25.0000 mg | Freq: Every day | ORAL | Status: DC
  Filled 2011-06-05: qty 1.25

## 2011-06-05 MED ORDER — FENTANYL CITRATE 0.05 MG/ML IJ SOLN
INTRAMUSCULAR | Status: DC | PRN
  Administered 2011-06-05: 50 ug via INTRAVENOUS
  Administered 2011-06-05: 100 ug via INTRAVENOUS
  Administered 2011-06-05 (×4): 50 ug via INTRAVENOUS
  Administered 2011-06-06: 25 ug via INTRAVENOUS
  Administered 2011-06-06: 50 ug via INTRAVENOUS
  Administered 2011-06-06: 25 ug via INTRAVENOUS

## 2011-06-05 MED ORDER — IOHEXOL 300 MG/ML  SOLN
100.0000 mL | Freq: Once | INTRAMUSCULAR | Status: AC | PRN
  Administered 2011-06-05: 100 mL via INTRAVENOUS

## 2011-06-05 MED ORDER — SODIUM CHLORIDE 0.9 % IR SOLN
Status: DC | PRN
  Administered 2011-06-06 (×3): 1000 mL

## 2011-06-05 MED ORDER — SERTRALINE HCL 25 MG PO TABS
25.0000 mg | ORAL_TABLET | Freq: Every day | ORAL | Status: DC
  Filled 2011-06-05 (×2): qty 1

## 2011-06-05 MED ORDER — ROCURONIUM BROMIDE 100 MG/10ML IV SOLN
INTRAVENOUS | Status: DC | PRN
  Administered 2011-06-05: 20 mg via INTRAVENOUS
  Administered 2011-06-05: 10 mg via INTRAVENOUS
  Administered 2011-06-05: 30 mg via INTRAVENOUS

## 2011-06-05 MED ORDER — HETASTARCH-ELECTROLYTES 6 % IV SOLN
INTRAVENOUS | Status: DC | PRN
  Administered 2011-06-05 – 2011-06-06 (×2): via INTRAVENOUS

## 2011-06-05 MED ORDER — IOHEXOL 300 MG/ML  SOLN
20.0000 mL | INTRAMUSCULAR | Status: DC
  Administered 2011-06-05: 20 mL via ORAL

## 2011-06-05 MED ORDER — ONDANSETRON HCL 4 MG/2ML IJ SOLN
INTRAMUSCULAR | Status: DC | PRN
  Administered 2011-06-05 – 2011-06-06 (×2): 4 mg via INTRAVENOUS

## 2011-06-05 SURGICAL SUPPLY — 64 items
APPLIER CLIP ROT 10 11.4 M/L (STAPLE)
BANDAGE GAUZE ELAST BULKY 4 IN (GAUZE/BANDAGES/DRESSINGS) ×3 IMPLANT
BLADE SURG 10 STRL SS (BLADE) ×3 IMPLANT
BLADE SURG ROTATE 9660 (MISCELLANEOUS) IMPLANT
CANISTER SUCTION 2500CC (MISCELLANEOUS) ×6 IMPLANT
CHLORAPREP W/TINT 26ML (MISCELLANEOUS) ×3 IMPLANT
CLIP APPLIE ROT 10 11.4 M/L (STAPLE) IMPLANT
CLOTH BEACON ORANGE TIMEOUT ST (SAFETY) ×3 IMPLANT
COVER SURGICAL LIGHT HANDLE (MISCELLANEOUS) ×3 IMPLANT
CUTTER LINEAR ENDO 35 ETS (STAPLE) IMPLANT
CUTTER LINEAR ENDO 35 ETS TH (STAPLE) IMPLANT
DECANTER SPIKE VIAL GLASS SM (MISCELLANEOUS) ×3 IMPLANT
DERMABOND ADVANCED (GAUZE/BANDAGES/DRESSINGS) ×1
DERMABOND ADVANCED .7 DNX12 (GAUZE/BANDAGES/DRESSINGS) ×2 IMPLANT
DRAIN CHANNEL 19F RND (DRAIN) ×3 IMPLANT
DRAPE UTILITY 15X26 W/TAPE STR (DRAPE) ×6 IMPLANT
DRSG PAD ABDOMINAL 8X10 ST (GAUZE/BANDAGES/DRESSINGS) ×3 IMPLANT
ELECT BLADE 4.0 EZ CLEAN MEGAD (MISCELLANEOUS) ×3
ELECT REM PT RETURN 9FT ADLT (ELECTROSURGICAL) ×3
ELECTRODE BLDE 4.0 EZ CLN MEGD (MISCELLANEOUS) ×2 IMPLANT
ELECTRODE REM PT RTRN 9FT ADLT (ELECTROSURGICAL) ×2 IMPLANT
ENDOLOOP SUT PDS II  0 18 (SUTURE)
ENDOLOOP SUT PDS II 0 18 (SUTURE) IMPLANT
EVACUATOR SILICONE 100CC (DRAIN) ×3 IMPLANT
GLOVE BIOGEL PI IND STRL 7.5 (GLOVE) ×2 IMPLANT
GLOVE BIOGEL PI IND STRL 8 (GLOVE) ×2 IMPLANT
GLOVE BIOGEL PI INDICATOR 7.5 (GLOVE) ×1
GLOVE BIOGEL PI INDICATOR 8 (GLOVE) ×1
GLOVE ECLIPSE 7.5 STRL STRAW (GLOVE) ×3 IMPLANT
GLOVE SURG SS PI 7.5 STRL IVOR (GLOVE) ×3 IMPLANT
GOWN SRG XL XLNG 56XLVL 4 (GOWN DISPOSABLE) ×2 IMPLANT
GOWN STRL NON-REIN LRG LVL3 (GOWN DISPOSABLE) ×6 IMPLANT
GOWN STRL NON-REIN XL XLG LVL4 (GOWN DISPOSABLE) ×1
KIT BASIN OR (CUSTOM PROCEDURE TRAY) ×3 IMPLANT
KIT ROOM TURNOVER OR (KITS) ×3 IMPLANT
NS IRRIG 1000ML POUR BTL (IV SOLUTION) ×12 IMPLANT
PAD ARMBOARD 7.5X6 YLW CONV (MISCELLANEOUS) ×6 IMPLANT
PENCIL BUTTON HOLSTER BLD 10FT (ELECTRODE) ×3 IMPLANT
POUCH SPECIMEN RETRIEVAL 10MM (ENDOMECHANICALS) ×6 IMPLANT
RELOAD /EVU35 (ENDOMECHANICALS) IMPLANT
RELOAD CUTTER ETS 35MM STAND (ENDOMECHANICALS) IMPLANT
SET IRRIG TUBING LAPAROSCOPIC (IRRIGATION / IRRIGATOR) ×3 IMPLANT
SPECIMEN JAR SMALL (MISCELLANEOUS) ×3 IMPLANT
SPONGE GAUZE 4X4 12PLY (GAUZE/BANDAGES/DRESSINGS) ×3 IMPLANT
SPONGE LAP 18X18 X RAY DECT (DISPOSABLE) ×6 IMPLANT
STAPLER VISISTAT 35W (STAPLE) ×3 IMPLANT
SUCTION POOLE TIP (SUCTIONS) ×3 IMPLANT
SUT ETHILON 3 0 FSL (SUTURE) ×3 IMPLANT
SUT MNCRL AB 4-0 PS2 18 (SUTURE) ×3 IMPLANT
SUT PDS AB 1 CT  36 (SUTURE) ×4
SUT PDS AB 1 CT 36 (SUTURE) ×8 IMPLANT
SUT SILK 2 0 (SUTURE) ×1
SUT SILK 2-0 18XBRD TIE 12 (SUTURE) ×2 IMPLANT
SUT SILK 3 0 SH CR/8 (SUTURE) ×6 IMPLANT
TOWEL OR 17X24 6PK STRL BLUE (TOWEL DISPOSABLE) ×3 IMPLANT
TOWEL OR 17X26 10 PK STRL BLUE (TOWEL DISPOSABLE) ×3 IMPLANT
TRAY FOLEY CATH 14FR (SET/KITS/TRAYS/PACK) ×3 IMPLANT
TRAY LAPAROSCOPIC (CUSTOM PROCEDURE TRAY) ×3 IMPLANT
TROCAR XCEL 12X100 BLDLESS (ENDOMECHANICALS) ×3 IMPLANT
TROCAR XCEL BLUNT TIP 100MML (ENDOMECHANICALS) ×3 IMPLANT
TROCAR XCEL NON-BLD 5MMX100MML (ENDOMECHANICALS) ×3 IMPLANT
TUBE CONNECTING 12X1/4 (SUCTIONS) ×3 IMPLANT
WATER STERILE IRR 1000ML POUR (IV SOLUTION) IMPLANT
YANKAUER SUCT BULB TIP NO VENT (SUCTIONS) ×3 IMPLANT

## 2011-06-05 NOTE — H&P (Signed)
Meredith Rose is an 61 y.o. female.   Chief Complaint: Abdominal pain since Saturday evening, now localized to RLQ HPI: Has been having abdominal pain since Saturday evening, has not localized to the RLQ assoicaited with nausea, vomiting, fevers, and chills.  Past Medical History  Diagnosis Date  . Hypertension   . Thyroid disease   . Anxiety   . Ovarian cancer     Past Surgical History  Procedure Date  . Right knee arthroplasty   . Abdominal hysterectomy   . Knee arthroscopy     No family history on file. Social History:  reports that she has never smoked. She does not have any smokeless tobacco history on file. She reports that she does not drink alcohol or use illicit drugs.  Allergies: No Known Allergies   (Not in a hospital admission)  Results for orders placed during the hospital encounter of 06/05/11 (from the past 48 hour(s))  CBC     Status: Abnormal   Collection Time   06/05/11  3:16 PM      Component Value Range Comment   WBC 19.8 (*) 4.0 - 10.5 (K/uL)    RBC 4.50  3.87 - 5.11 (MIL/uL)    Hemoglobin 13.1  12.0 - 15.0 (g/dL)    HCT 16.1  09.6 - 04.5 (%)    MCV 84.2  78.0 - 100.0 (fL)    MCH 29.1  26.0 - 34.0 (pg)    MCHC 34.6  30.0 - 36.0 (g/dL)    RDW 40.9  81.1 - 91.4 (%)    Platelets 291  150 - 400 (K/uL)   BASIC METABOLIC PANEL     Status: Abnormal   Collection Time   06/05/11  3:16 PM      Component Value Range Comment   Sodium 139  135 - 145 (mEq/L)    Potassium 3.4 (*) 3.5 - 5.1 (mEq/L)    Chloride 98  96 - 112 (mEq/L)    CO2 29  19 - 32 (mEq/L)    Glucose, Bld 120 (*) 70 - 99 (mg/dL)    BUN 11  6 - 23 (mg/dL)    Creatinine, Ser 7.82  0.50 - 1.10 (mg/dL)    Calcium 9.9  8.4 - 10.5 (mg/dL)    GFR calc non Af Amer 77 (*) >90 (mL/min)    GFR calc Af Amer 89 (*) >90 (mL/min)   DIFFERENTIAL     Status: Abnormal   Collection Time   06/05/11  3:16 PM      Component Value Range Comment   Neutrophils Relative 84 (*) 43 - 77 (%)    Neutro Abs 16.6 (*)  1.7 - 7.7 (K/uL)    Lymphocytes Relative 9 (*) 12 - 46 (%)    Lymphs Abs 1.7  0.7 - 4.0 (K/uL)    Monocytes Relative 6  3 - 12 (%)    Monocytes Absolute 1.2 (*) 0.1 - 1.0 (K/uL)    Eosinophils Relative 0  0 - 5 (%)    Eosinophils Absolute 0.1  0.0 - 0.7 (K/uL)    Basophils Relative 0  0 - 1 (%)    Basophils Absolute 0.0  0.0 - 0.1 (K/uL)   LIPASE, BLOOD     Status: Normal   Collection Time   06/05/11  3:16 PM      Component Value Range Comment   Lipase 21  11 - 59 (U/L)   URINALYSIS, ROUTINE W REFLEX MICROSCOPIC     Status: Abnormal  Collection Time   06/05/11  4:13 PM      Component Value Range Comment   Color, Urine ORANGE (*) YELLOW  BIOCHEMICALS MAY BE AFFECTED BY COLOR   APPearance CLEAR  CLEAR     Specific Gravity, Urine 1.029  1.005 - 1.030     pH 6.0  5.0 - 8.0     Glucose, UA NEGATIVE  NEGATIVE (mg/dL)    Hgb urine dipstick TRACE (*) NEGATIVE     Bilirubin Urine SMALL (*) NEGATIVE     Ketones, ur 15 (*) NEGATIVE (mg/dL)    Protein, ur 30 (*) NEGATIVE (mg/dL)    Urobilinogen, UA 2.0 (*) 0.0 - 1.0 (mg/dL)    Nitrite POSITIVE (*) NEGATIVE     Leukocytes, UA SMALL (*) NEGATIVE    URINE MICROSCOPIC-ADD ON     Status: Abnormal   Collection Time   06/05/11  4:13 PM      Component Value Range Comment   Squamous Epithelial / LPF RARE  RARE     WBC, UA 0-2  <3 (WBC/hpf)    RBC / HPF 3-6  <3 (RBC/hpf)    Bacteria, UA FEW (*) RARE     Casts HYALINE CASTS (*) NEGATIVE     Urine-Other MUCOUS PRESENT   AMORPHOUS URATES/PHOSPHATES   Ct Abdomen Pelvis W Contrast  06/05/2011  *RADIOLOGY REPORT*  Clinical Data: .  Nausea and vomiting.  Fever.  CT ABDOMEN AND PELVIS WITH CONTRAST  Technique:  Multidetector CT imaging of the abdomen and pelvis was performed following the standard protocol during bolus administration of intravenous contrast.  Contrast:  100 ml Omnipaque-300  Comparison: Report from 12/11/2000  Findings: A 3 mm nodule in the left lower lobe on image 9 of series 3 was described  on the prior CT scan from 2002 and accordingly is highly likely to be benign.  Lingular subsegmental atelectasis or scarring noted.  Diffuse hepatic steatosis is present.  There are several hypodensities in the left hepatic lobe which are technically too small to characterize.  A right hepatic lobe partially exophytic cyst measures 9.3 x 7.9 cm on image 40 of series 2.  Multiple gallstones are present in the gallbladder, measuring up to 0.7 cm in diameter.  The spleen, pancreas, and adrenal glands appear normal.  Orally administered contrast extends through to the cecum.  The kidneys appear unremarkable, as do the proximal ureters.  There is abnormal right lower quadrant stranding in the vicinity of the appendix, with a small amount of adjacent extraluminal gas. However, there is also some adjacent wall thickening of the terminal ileum in this vicinity, and portions of the appendix do not appear thickened.  Although I favor acute appendicitis, possibly with a small microperforation, the possibility of focal terminal ileitis is not totally excluded.  Urinary bladder and ureters appear unremarkable.  Clips from prior lymph node dissection noted along the iliac chains.  Currently no pathologic iliac or pelvic adenopathy is noted and small mesenteric lymph nodes are probably reactive.  No omental studding.  Sclerotic pubic bodies noted.  IMPRESSION:  1. Suspected acute appendicitis with focal microperforation. Adjacent distal ileum wall thickening is probably secondary, and is less likely to be the underlying cause of the localized inflammation in the right lower quadrant. 2.  Large right hepatic lobe cyst.  3.  Cholelithiasis. 4.  Osteitis pubis. 5.  Chronic 3 mm left lower lobe nodule, benign. 6.  Diffuse hepatic steatosis.  Original Report Authenticated By: Dellia Cloud, M.D.  Review of Systems  Constitutional: Positive for fever and chills.  HENT: Negative.   Eyes: Negative.   Cardiovascular:  Negative.   Gastrointestinal: Positive for nausea, vomiting and abdominal pain (rlq).  Genitourinary: Negative.   Musculoskeletal: Negative.   Skin: Negative.   Neurological: Negative.   Endo/Heme/Allergies: Negative.   Psychiatric/Behavioral: Negative.     Blood pressure 105/44, pulse 78, temperature 97.8 F (36.6 C), temperature source Oral, resp. rate 18, SpO2 98.00%. Physical Exam  Constitutional: She is oriented to person, place, and time. She appears well-developed and well-nourished.  Eyes: Conjunctivae are normal. Pupils are equal, round, and reactive to light.  Neck: Normal range of motion. Neck supple.  Cardiovascular: Normal rate, regular rhythm and normal heart sounds.   Respiratory: Effort normal and breath sounds normal.  GI: Soft. Bowel sounds are normal. She exhibits no mass. There is tenderness in the right lower quadrant. There is guarding and tenderness at McBurney's point. There is no rebound.    Musculoskeletal: Normal range of motion.  Neurological: She is alert and oriented to person, place, and time. She has normal reflexes.  Skin: Skin is warm and dry.  Psychiatric: She has a normal mood and affect. Her behavior is normal. Judgment and thought content normal.     Assessment/Plan Acute appendicitis with possible perforation  Laparoscopic and possible open appendectomy because of acute perforation and previous history of pelvic surgery for ovarian cancer.   III, O 06/05/2011, 7:19 PM   2

## 2011-06-05 NOTE — ED Notes (Signed)
Pt is here for RLQ pain which began Saturday night.  Pt has been having n/v and fever with this.  No GU symptoms with this.

## 2011-06-05 NOTE — ED Provider Notes (Signed)
History     CSN: [id number]  Arrival date & time 06/05/11  1248   First MD Initiated Contact with Patient 06/05/11 1255      Chief Complaint  Patient presents with  . Abdominal Pain    (Consider location/radiation/quality/duration/timing/severity/associated sxs/prior treatment) HPI Comments: Patient presents urgent care complaining of ongoing abdominal pain. Since Saturday patient has not been feeling well perceiving fevers feeling nauseous initially with upper epigastric and periumbilical pain. She describes she has been noticing that the pain is now on her right lower abdomen and suprapubic regions. The pain has intensified to the point that it hurts with movements. Patient does not relate having burning or pressure in urination but is voiding small amounts. She describes that she has never had a urinary tract infection before. She also described that today she has vomited 2 times and feels nauseous. Denies any diarrhea. Continuous expressing chills and fevers.  Patient also admits that has no appetite. And denies any flank pain.  Patient is a 61 y.o. female presenting with abdominal pain.  Abdominal Pain The primary symptoms of the illness include abdominal pain, fever, nausea and vomiting. The primary symptoms of the illness do not include diarrhea, hematemesis, vaginal discharge or vaginal bleeding. The current episode started 2 days ago. The onset of the illness was gradual. The problem has been rapidly worsening.  Additional symptoms associated with the illness include chills. Symptoms associated with the illness do not include frequency.    Past Medical History  Diagnosis Date  . Hypertension   . Thyroid disease   . Anxiety   . Ovarian cancer     Past Surgical History  Procedure Date  . Right knee arthroplasty   . Abdominal hysterectomy   . Knee arthroscopy     History reviewed. No pertinent family history.  History  Substance Use Topics  . Smoking status: Never  Smoker   . Smokeless tobacco: Not on file  . Alcohol Use: No    OB History    Grav Para Term Preterm Abortions TAB SAB Ect Mult Living                  Review of Systems  Constitutional: Positive for fever, chills and appetite change.  Gastrointestinal: Positive for nausea, vomiting and abdominal pain. Negative for diarrhea, blood in stool, abdominal distention, anal bleeding and hematemesis.  Genitourinary: Negative for frequency, flank pain, vaginal bleeding and vaginal discharge.  Skin: Negative for rash.    Allergies  Review of patient's allergies indicates no known allergies.  Home Medications   Current Outpatient Rx  Name Route Sig Dispense Refill  . LEVOTHYROXINE SODIUM 112 MCG PO TABS Oral Take 112 mcg by mouth daily.      Marland Kitchen LOSARTAN POTASSIUM-HCTZ 100-25 MG PO TABS Oral Take 1 tablet by mouth daily.      Marland Kitchen PROPRANOLOL HCL 10 MG PO TABS Oral Take 10 mg by mouth daily.      Marland Kitchen ZOLOFT PO Oral Take 1 tablet by mouth daily.      . ACETAMINOPHEN 500 MG PO TABS Oral Take 1,000 mg by mouth every 6 (six) hours as needed. For pain     . ASPIRIN 81 MG PO TABS Oral Take 81 mg by mouth daily.      . CELECOXIB 200 MG PO CAPS Oral Take 200 mg by mouth daily.      Marland Kitchen VITAMIN D 2000 UNITS PO TABS Oral Take 2,000 Units by mouth daily.      Marland Kitchen  HYDROCODONE-ACETAMINOPHEN 10-325 MG PO TABS Oral Take 1 tablet by mouth every 6 (six) hours as needed. For pain     . ADULT MULTIVITAMIN W/MINERALS CH Oral Take 1 tablet by mouth daily.        BP 113/67  Pulse 84  Temp(Src) 101.5 F (38.6 C) (Oral)  Resp 20  SpO2 98%  Physical Exam  Nursing note and vitals reviewed. Constitutional: She appears well-developed and well-nourished. She appears ill. She appears distressed.    Neck: Neck supple.  Abdominal: Soft. Normal appearance. She exhibits no distension and no mass. There is no hepatosplenomegaly. There is tenderness. There is rebound, guarding and tenderness at McBurney's point. There is no  rigidity and no CVA tenderness.  Neurological: She is alert.    ED Course  Procedures (including critical care time)  Labs Reviewed  POCT URINALYSIS DIP (DEVICE) - Abnormal; Notable for the following:    Bilirubin Urine SMALL (*)    Hgb urine dipstick MODERATE (*)    Protein, ur 100 (*)    Urobilinogen, UA 2.0 (*)    Nitrite POSITIVE (*)    Leukocytes, UA TRACE (*) Biochemical Testing Only. Please order routine urinalysis from main lab if confirmatory testing is needed.   All other components within normal limits  URINE CULTURE   No results found.   1. Abdominal pain       MDM  Patient has been symptomatic since Saturday which includes nausea, vomiting, fevers and abdominal pains. An initial epigastric and periumbilical pain now right lower quadrant pain. Patient has an abnormal urine but exam was significant for right lower quadrant peritoneal reaction and guarding. I will consider transferring this patient as this could be appendicitis.  Patient is describing initial stages with mostly upper abdominal pain periumbilical with a geographical pattern change     Jimmie Molly, MD 06/05/11 [phone]

## 2011-06-05 NOTE — ED Notes (Signed)
Pt ambulated to restroom without difficulties. Pt obtaining urine sample

## 2011-06-05 NOTE — Anesthesia Preprocedure Evaluation (Addendum)
Anesthesia Evaluation  Patient identified by MRN, date of birth, ID band Patient awake    Reviewed: Allergy & Precautions, H&P , NPO status , Patient's Chart, lab work & pertinent test results  Airway Mallampati: II TM Distance: <3 FB Neck ROM: Full    Dental  (+) Teeth Intact   Pulmonary neg pulmonary ROS,  breath sounds clear to auscultation  Pulmonary exam normal       Cardiovascular hypertension, Pt. on medications Rhythm:Regular Rate:Tachycardia     Neuro/Psych Anxiety negative neurological ROS     GI/Hepatic negative GI ROS, Neg liver ROS,   Endo/Other  negative endocrine ROS  Renal/GU negative Renal ROS  negative genitourinary   Musculoskeletal negative musculoskeletal ROS (+)   Abdominal (+) + obese,  Abdomen: tender.    Peds  Hematology negative hematology ROS (+)   Anesthesia Other Findings   Reproductive/Obstetrics negative OB ROS Ovarian ca                        Anesthesia Physical Anesthesia Plan  ASA: II and Emergent  Anesthesia Plan: General   Post-op Pain Management:    Induction: Intravenous  Airway Management Planned: Oral ETT  Additional Equipment:   Intra-op Plan:   Post-operative Plan: Extubation in OR  Informed Consent: I have reviewed the patients History and Physical, chart, labs and discussed the procedure including the risks, benefits and alternatives for the proposed anesthesia with the patient or authorized representative who has indicated his/her understanding and acceptance.     Plan Discussed with: CRNA and Surgeon  Anesthesia Plan Comments:         Anesthesia Quick Evaluation

## 2011-06-05 NOTE — ED Notes (Signed)
C/o suprapubic /lower abdominal pain since Saturday with associated nausea, fever, voiding small amounts, and vomiting x 2 today.  Denies diarrhea.  States abdominal pain radiates to her back.

## 2011-06-05 NOTE — Anesthesia Procedure Notes (Signed)
Procedure Name: Intubation Date/Time: 06/05/2011 9:45 PM Performed by: Ellin Goodie Pre-anesthesia Checklist: Patient identified, Emergency Drugs available, Suction available, Patient being monitored and Timeout performed Patient Re-evaluated:Patient Re-evaluated prior to inductionOxygen Delivery Method: Circle system utilized Preoxygenation: Pre-oxygenation with 100% oxygen Intubation Type: IV induction and Rapid sequence Laryngoscope Size: Mac and 3 Grade View: Grade I Tube type: Oral Number of attempts: 1 Airway Equipment and Method: Stylet Placement Confirmation: ETT inserted through vocal cords under direct vision,  positive ETCO2 and breath sounds checked- equal and bilateral Secured at: 22 cm Tube secured with: Tape Dental Injury: Teeth and Oropharynx as per pre-operative assessment

## 2011-06-05 NOTE — ED Provider Notes (Signed)
History     CSN: [id number]  Arrival date & time 06/05/11  1456   First MD Initiated Contact with Patient 06/05/11 1543      Chief Complaint  Patient presents with  . Abdominal Pain    (Consider location/radiation/quality/duration/timing/severity/associated sxs/prior treatment) HPI  Pt sent [facility] the ED from [facility] where she was seen by Dr. Ladon Applebaum. Since Saturday patient has not been feeling well perceiving fevers feeling nauseous initially with upper epigastric and periumbilical pain. She describes she has been noticing that the pain is now on her right lower abdomen and suprapubic regions. The pain has intensified to the point that it hurts with movements. Patient does not relate having burning or pressure in urination but is voiding small amounts. She describes that she has never had a urinary tract infection before. She also described that today she has vomited 2 times and feels nauseous. Denies any diarrhea. Continuous expressing chills and fevers.   Past Medical History  Diagnosis Date  . Hypertension   . Thyroid disease   . Anxiety   . Ovarian cancer     Past Surgical History  Procedure Date  . Right knee arthroplasty   . Abdominal hysterectomy   . Knee arthroscopy     No family history on file.  History  Substance Use Topics  . Smoking status: Never Smoker   . Smokeless tobacco: Not on file  . Alcohol Use: No    OB History    Grav Para Term Preterm Abortions TAB SAB Ect Mult Living                   Review of Systems  ROS  HEENT: denies blurry vision or change in hearing PULMONARY: Denies difficulty breathing and SOB CARDIAC: denies chest pain or heart palpitations MUSCULOSKELETAL:  denies being unable to ambulate ABDOMEN AL: denies diarrhea  GU: denies loss of bowel or urinary control NEURO: denies numbness and tingling in extremities     Allergies  Review of patient's allergies indicates no known allergies.  Home Medications   Current  Outpatient Rx  Name Route Sig Dispense Refill  . ACETAMINOPHEN 500 MG PO TABS Oral Take 1,000 mg by mouth every 6 (six) hours as needed. For pain     . CELECOXIB 200 MG PO CAPS Oral Take 200 mg by mouth daily.      Marland Kitchen VITAMIN D 2000 UNITS PO TABS Oral Take 2,000 Units by mouth daily.      Marland Kitchen HYDROCODONE-ACETAMINOPHEN 5-325 MG PO TABS Oral Take 1 tablet by mouth every 6 (six) hours as needed. For pain    . LEVOTHYROXINE SODIUM 112 MCG PO TABS Oral Take 112 mcg by mouth daily.      Marland Kitchen LOSARTAN POTASSIUM-HCTZ 100-25 MG PO TABS Oral Take 1 tablet by mouth daily.      . ADULT MULTIVITAMIN W/MINERALS CH Oral Take 1 tablet by mouth daily.      Marland Kitchen PROPRANOLOL HCL 10 MG PO TABS Oral Take 10 mg by mouth daily.      Marland Kitchen ZOLOFT PO Oral Take 1 tablet by mouth daily.        BP 105/44  Pulse 78  Temp(Src) 97.8 F (36.6 C) (Oral)  Resp 18  SpO2 98%  Physical Exam  Nursing note and vitals reviewed. Constitutional: She appears well-developed and well-nourished. No distress.  HENT:  Head: Normocephalic and atraumatic.  Eyes: Pupils are equal, round, and reactive to light.  Neck: Normal range of motion. Neck  supple.  Cardiovascular: Normal rate and regular rhythm.   Pulmonary/Chest: Effort normal.  Abdominal: Soft. Bowel sounds are normal. She exhibits no distension and no mass. There is tenderness in the right lower quadrant. There is no rigidity, no rebound (RLQ), no guarding and negative Murphy's sign.    Neurological: She is alert.  Skin: Skin is warm and dry.    ED Course  Procedures (including critical care time)  Labs Reviewed  CBC - Abnormal; Notable for the following:    WBC 19.8 (*)    All other components within normal limits  BASIC METABOLIC PANEL - Abnormal; Notable for the following:    Potassium 3.4 (*)    Glucose, Bld 120 (*)    GFR calc non Af Amer 77 (*)    GFR calc Af Amer 89 (*)    All other components within normal limits  DIFFERENTIAL - Abnormal; Notable for the  following:    Neutrophils Relative 84 (*)    Neutro Abs 16.6 (*)    Lymphocytes Relative 9 (*)    Monocytes Absolute 1.2 (*)    All other components within normal limits  URINALYSIS, ROUTINE W REFLEX MICROSCOPIC - Abnormal; Notable for the following:    Color, Urine ORANGE (*) BIOCHEMICALS MAY BE AFFECTED BY COLOR   Hgb urine dipstick TRACE (*)    Bilirubin Urine SMALL (*)    Ketones, ur 15 (*)    Protein, ur 30 (*)    Urobilinogen, UA 2.0 (*)    Nitrite POSITIVE (*)    Leukocytes, UA SMALL (*)    All other components within normal limits  URINE MICROSCOPIC-ADD ON - Abnormal; Notable for the following:    Bacteria, UA FEW (*)    Casts HYALINE CASTS (*)    All other components within normal limits  LIPASE, BLOOD   Ct Abdomen Pelvis W Contrast  06/05/2011  *RADIOLOGY REPORT*  Clinical Data: .  Nausea and vomiting.  Fever.  CT ABDOMEN AND PELVIS WITH CONTRAST  Technique:  Multidetector CT imaging of the abdomen and pelvis was performed following the standard protocol during bolus administration of intravenous contrast.  Contrast:  100 ml Omnipaque-300  Comparison: Report from 12/11/2000  Findings: A 3 mm nodule in the left lower lobe on image 9 of series 3 was described on the prior CT scan from 2002 and accordingly is highly likely to be benign.  Lingular subsegmental atelectasis or scarring noted.  Diffuse hepatic steatosis is present.  There are several hypodensities in the left hepatic lobe which are technically too small to characterize.  A right hepatic lobe partially exophytic cyst measures 9.3 x 7.9 cm on image 40 of series 2.  Multiple gallstones are present in the gallbladder, measuring up to 0.7 cm in diameter.  The spleen, pancreas, and adrenal glands appear normal.  Orally administered contrast extends through to the cecum.  The kidneys appear unremarkable, as do the proximal ureters.  There is abnormal right lower quadrant stranding in the vicinity of the appendix, with a small  amount of adjacent extraluminal gas. However, there is also some adjacent wall thickening of the terminal ileum in this vicinity, and portions of the appendix do not appear thickened.  Although I favor acute appendicitis, possibly with a small microperforation, the possibility of focal terminal ileitis is not totally excluded.  Urinary bladder and ureters appear unremarkable.  Clips from prior lymph node dissection noted along the iliac chains.  Currently no pathologic iliac or pelvic adenopathy is noted  and small mesenteric lymph nodes are probably reactive.  No omental studding.  Sclerotic pubic bodies noted.  IMPRESSION:  1. Suspected acute appendicitis with focal microperforation. Adjacent distal ileum wall thickening is probably secondary, and is less likely to be the underlying cause of the localized inflammation in the right lower quadrant. 2.  Large right hepatic lobe cyst.  3.  Cholelithiasis. 4.  Osteitis pubis. 5.  Chronic 3 mm left lower lobe nodule, benign. 6.  Diffuse hepatic steatosis.  Original Report Authenticated By: Dellia Cloud, M.D.     1. Acute appendicitis       MDM  CT shows acute appendicitis. Dr. Ignacia Palma is consulting surgery at this time for further managment. The patients pain is adequately managed at this time as she declines anything else for pain.        Dorthula Matas, PA 06/05/11 [phone]

## 2011-06-05 NOTE — ED Provider Notes (Signed)
6:41 PM Pt with abdominal pain, RLQ tenderness, WBC 19,800, and CT of the abdomen showing acute appendicitis with microperforation.  Call to Dr. Lindie Spruce, on call for general surgery, and he will be down to see her.  Carleene Cooper III, MD 06/05/11 [phone]

## 2011-06-06 ENCOUNTER — Encounter (HOSPITAL_COMMUNITY): Payer: Self-pay | Admitting: General Surgery

## 2011-06-06 ENCOUNTER — Inpatient Hospital Stay (HOSPITAL_COMMUNITY): Payer: BC Managed Care – PPO

## 2011-06-06 LAB — CBC
HCT: 29.8 % — ABNORMAL LOW (ref 36.0–46.0)
Hemoglobin: 10 g/dL — ABNORMAL LOW (ref 12.0–15.0)
MCH: 28.1 pg (ref 26.0–34.0)
MCHC: 33.2 g/dL (ref 30.0–36.0)
MCV: 84.6 fL (ref 78.0–100.0)
Platelets: 227 10*3/uL (ref 150–400)
RBC: 3.63 MIL/uL — ABNORMAL LOW (ref 3.87–5.11)
RDW: 14.4 % (ref 11.5–15.5)
WBC: 16.1 10*3/uL — ABNORMAL HIGH (ref 4.0–10.5)

## 2011-06-06 LAB — BASIC METABOLIC PANEL
BUN: 10 mg/dL (ref 6–23)
CO2: 27 mEq/L (ref 19–32)
CO2: 27 mEq/L (ref 19–32)
Calcium: 8.3 mg/dL — ABNORMAL LOW (ref 8.4–10.5)
Chloride: 104 mEq/L (ref 96–112)
Creatinine, Ser: 0.62 mg/dL (ref 0.50–1.10)
Creatinine, Ser: 0.64 mg/dL (ref 0.50–1.10)
GFR calc non Af Amer: >90 mL/min (ref >90)
Sodium: 137 mEq/L (ref 135–145)

## 2011-06-06 LAB — DIFFERENTIAL
Basophils Absolute: 0 10*3/uL (ref 0.0–0.1)
Lymphocytes Relative: 5 % — ABNORMAL LOW (ref 12–46)
Lymphocytes Relative: 5 % — ABNORMAL LOW (ref 12–46)
Lymphs Abs: 0.8 10*3/uL (ref 0.7–4.0)
Monocytes Absolute: 0.7 10*3/uL (ref 0.1–1.0)
Monocytes Absolute: 1 10*3/uL (ref 0.1–1.0)
Monocytes Relative: 6 % (ref 3–12)
Neutro Abs: 14.7 10*3/uL — ABNORMAL HIGH (ref 1.7–7.7)
Neutro Abs: 14.3 10*3/uL — ABNORMAL HIGH (ref 1.7–7.7)

## 2011-06-06 LAB — MRSA PCR SCREENING: MRSA by PCR: NEGATIVE

## 2011-06-06 LAB — URINE CULTURE: Colony Count: 55000

## 2011-06-06 MED ORDER — ONDANSETRON HCL 4 MG/2ML IJ SOLN
4.0000 mg | Freq: Four times a day (QID) | INTRAMUSCULAR | Status: DC | PRN
  Administered 2011-06-08: 4 mg via INTRAVENOUS
  Filled 2011-06-06: qty 2

## 2011-06-06 MED ORDER — HYDROMORPHONE HCL PF 1 MG/ML IJ SOLN
0.5000 mg | Freq: Four times a day (QID) | INTRAMUSCULAR | Status: DC | PRN
  Administered 2011-06-06 (×2): 0.5 mg via INTRAVENOUS

## 2011-06-06 MED ORDER — HEPARIN SODIUM (PORCINE) 5000 UNIT/ML IJ SOLN
5000.0000 [IU] | Freq: Three times a day (TID) | INTRAMUSCULAR | Status: DC
  Administered 2011-06-06 – 2011-06-12 (×18): 5000 [IU] via SUBCUTANEOUS
  Filled 2011-06-06 (×24): qty 1

## 2011-06-06 MED ORDER — NEOSTIGMINE METHYLSULFATE 1 MG/ML IJ SOLN
INTRAMUSCULAR | Status: DC | PRN
  Administered 2011-06-06: 5 mg via INTRAVENOUS

## 2011-06-06 MED ORDER — HYDROMORPHONE HCL PF 1 MG/ML IJ SOLN
INTRAMUSCULAR | Status: AC
  Filled 2011-06-06: qty 1

## 2011-06-06 MED ORDER — SODIUM CHLORIDE 0.9 % IJ SOLN: 9.0000 mL | INTRAMUSCULAR | Status: DC | PRN

## 2011-06-06 MED ORDER — BIOTENE DRY MOUTH MT LIQD
15.0000 mL | Freq: Two times a day (BID) | OROMUCOSAL | Status: DC
  Administered 2011-06-06 – 2011-06-11 (×9): 15 mL via OROMUCOSAL

## 2011-06-06 MED ORDER — CHLORHEXIDINE GLUCONATE 0.12 % MT SOLN
15.0000 mL | Freq: Two times a day (BID) | OROMUCOSAL | Status: DC
  Administered 2011-06-06 – 2011-06-11 (×9): 15 mL via OROMUCOSAL
  Filled 2011-06-06 (×9): qty 15

## 2011-06-06 MED ORDER — ACETAMINOPHEN 650 MG RE SUPP
650.0000 mg | RECTAL | Status: DC | PRN
  Filled 2011-06-06: qty 1

## 2011-06-06 MED ORDER — NALOXONE HCL 0.4 MG/ML IJ SOLN: 0.4000 mg | INTRAMUSCULAR | Status: DC | PRN

## 2011-06-06 MED ORDER — DIPHENHYDRAMINE HCL 50 MG/ML IJ SOLN: 12.5000 mg | Freq: Four times a day (QID) | INTRAMUSCULAR | Status: DC | PRN

## 2011-06-06 MED ORDER — DIPHENHYDRAMINE HCL 12.5 MG/5ML PO ELIX
12.5000 mg | ORAL_SOLUTION | Freq: Four times a day (QID) | ORAL | Status: DC | PRN
  Filled 2011-06-06: qty 5

## 2011-06-06 MED ORDER — SODIUM CHLORIDE 0.9 % IV BOLUS (SEPSIS)
500.0000 mL | Freq: Once | INTRAVENOUS | Status: AC
  Administered 2011-06-06: 500 mL via INTRAVENOUS

## 2011-06-06 MED ORDER — HYDROMORPHONE HCL PF 1 MG/ML IJ SOLN
0.2500 mg | INTRAMUSCULAR | Status: DC | PRN
  Administered 2011-06-06 (×2): 0.5 mg via INTRAVENOUS

## 2011-06-06 MED ORDER — GLYCOPYRROLATE 0.2 MG/ML IJ SOLN
INTRAMUSCULAR | Status: DC | PRN
  Administered 2011-06-06: .8 mg via INTRAVENOUS

## 2011-06-06 MED ORDER — SODIUM CHLORIDE 0.9 % IV SOLN
1.0000 g | INTRAVENOUS | Status: DC
  Administered 2011-06-06 – 2011-06-12 (×7): 1 g via INTRAVENOUS
  Filled 2011-06-06 (×8): qty 1

## 2011-06-06 MED ORDER — HYDROMORPHONE 0.3 MG/ML IV SOLN
INTRAVENOUS | Status: DC
  Administered 2011-06-06: 1.2 mg via INTRAVENOUS
  Administered 2011-06-06: 0.9 mg via INTRAVENOUS
  Administered 2011-06-06: 19:00:00 via INTRAVENOUS
  Administered 2011-06-06: 1.8 mg via INTRAVENOUS
  Administered 2011-06-06: 0.9 mg via INTRAVENOUS
  Administered 2011-06-06: 02:00:00 via INTRAVENOUS
  Administered 2011-06-07: 0.6 mg via INTRAVENOUS
  Administered 2011-06-07: 1.59 mg via INTRAVENOUS
  Administered 2011-06-07: 0.6 mg via INTRAVENOUS
  Administered 2011-06-07: 0.199 mg via INTRAVENOUS
  Administered 2011-06-08: 0.2 mg via INTRAVENOUS
  Administered 2011-06-08: 0.199 mg via INTRAVENOUS
  Administered 2011-06-08: 1.19 mg via INTRAVENOUS
  Administered 2011-06-08 – 2011-06-09 (×2): 0.2 mg via INTRAVENOUS
  Administered 2011-06-09: 0.4 mg via INTRAVENOUS
  Administered 2011-06-09: 0.2 mg via INTRAVENOUS
  Administered 2011-06-09: 08:00:00 via INTRAVENOUS
  Administered 2011-06-10: 0.3999 mg via INTRAVENOUS
  Administered 2011-06-10: 0.6 mg via INTRAVENOUS
  Administered 2011-06-10: 0.3 mg via INTRAVENOUS
  Filled 2011-06-06 (×2): qty 25

## 2011-06-06 MED ORDER — LORAZEPAM 2 MG/ML IJ SOLN: 1.0000 mg | Freq: Once | INTRAMUSCULAR | Status: DC | PRN

## 2011-06-06 NOTE — Op Note (Signed)
OPERATIVE REPORT  DATE OF OPERATION: 06/05/2011 - 06/06/2011  PATIENT:  Meredith Rose  61 y.o. female  PRE-OPERATIVE DIAGNOSIS:  Appendicitis  POST-OPERATIVE DIAGNOSIS:  same and perforated appendix  PROCEDURE:  Procedure(s): APPENDECTOMY/Repair enterotomy, conversion to open appendectomy from laparoscopic  SURGEON:  Surgeon(s): Cherylynn Ridges, MD  ASSISTANT: None  ANESTHESIA:   general  EBL: 250 ml  BLOOD ADMINISTERED: none  DRAINS: Nasogastric Tube, Urinary Catheter (Foley) and (19Fr) Blake drain(s) in the RLQ   SPECIMEN:  Source of Specimen:  appendix  COUNTS CORRECT:  YES  PROCEDURE DETAILS: The patient was taken to the operating room and placed on the table in the supine position. After an adequate endotracheal anesthetic was administered she was prepped and draped in usual sterile manner exposing her entire abdomen.  After proper time out was performed identifying the patient and the procedure be performed we attempted a laparoscopic approach. This is felt to be somewhat risky and hazardous because patient had previous pelvic surgery for ovarian cancer back in 2003. 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  After proper time out was performed identifying the patient to be performed a supraumbilical midline incision was made using a #15 blade and taken down to the midline fascia. All once with air we incised into the fascia using 15 blade then bluntly dissected into the peritoneal cavity. When the [facility] cannula was put in place after passing the Pershing suture we were able to insufflate the abdomen with carbon dioxide gas however and certainly camera demonstrated significant amount of omental adhesions to the abdominal wall. We were able to see into the pelvis and spent 2 hours with the cannula in the suprapubic midline area a 5 mm cannula and the left lower quadrant 12 mm cannula, we spent 2 hours dissecting out adhesions and identifying the appendix  however we never could adequately visualize the base.  It was during the dissection oval piece of bowel off of the appendix that an inadvertent enterotomy was made. No attempts to laparoscopically repaired this is done. We therefore decided to open.  A right trans-rectus transverse incision was made using a #10 blade and taken down to the rectus muscle the anterior and posterior rectus sheath using electrocautery. Once we intraperitoneal cavity there was a significant amount of fluid and blood from the previous dissection. The appendix was denuded partially all the way down to the base of the cecum however we were able to get a good purchase with a TX- 30 stapler at the base of the appendix. The mesoappendix was tied off using a 2-0 silk tie. Removed the appendix along with multiple other pieces of surrounding tissue which been involved in the inflammatory infected pocket which was an abscess cavity in right lower quadrant.  There have been enterotomy made and this was repaired using a suture repair of 3-0 silk full thickness stitches. Once this was done we irrigated with copious amounts saline. We closed the posterior rectus sheath and anterior rectus sheath using running #1 PDS suture. The skin sites at the umbilicus and left lower quadrant were closed using stainless steel staples after closing of the fascial supraumbilical site using a Pershing suture which is in place. We left the wound in the right lower quadrant open because a significant amount Examination this is packed with a saline soaked Kerlix gauze. Once this done a sterile dressing was applied all counts were correct including needles sponges and instruments  PATIENT DISPOSITION:  PACU -  hemodynamically stable.    III, O 5/7/20131:09 AM

## 2011-06-06 NOTE — Transfer of Care (Signed)
Immediate Anesthesia Transfer of Care Note  Patient: Meredith Rose  Procedure(s) Performed: Procedure(s) (LRB): APPENDECTOMY ()  Patient Location: PACU  Anesthesia Type: General  Level of Consciousness: awake, alert , oriented and patient cooperative  Airway & Oxygen Therapy: Patient Spontanous Breathing and Patient connected to face mask oxygen  Post-op Assessment: Report given to PACU RN and Post -op Vital signs reviewed and stable  Post vital signs: Reviewed and stable  Complications: No apparent anesthesia complications

## 2011-06-06 NOTE — Progress Notes (Signed)
UR complete 

## 2011-06-06 NOTE — Progress Notes (Signed)
Patient ID: Meredith Rose, female   DOB: 12-17-1950, 61 y.o.   MRN: [id number] 1 Day Post-Op  Subjective: Pt feels ok.  Doesn't complain of SOB but on non-rebreather.  No flatus.  Pain controlled  Objective: Vital signs in last 24 hours: Temp:  [97.5 F (36.4 C)-101.5 F (38.6 C)] 98.7 F (37.1 C) (05/07 0954) Pulse Rate:  [60-84] 75  (05/07 0954) Resp:  [13-20] 19  (05/07 0954) BP: (90-135)/(44-77) 101/45 mmHg (05/07 0954) SpO2:  [89 %-100 %] 97 % (05/07 0954) FiO2 (%):  [35 %-60 %] 60 % (05/07 0528) Weight:  [221 lb 1.6 oz (100.29 kg)] 221 lb 1.6 oz (100.29 kg) (05/06 2040) Last BM Date: 06/03/11  Intake/Output from previous day: 05/06 0701 - 05/07 0700 In: 3133.2 [I.V.:2233.2; IV Piggyback:900] Out: 1075 [Urine:675; Drains:150; Blood:250] Intake/Output this shift: Total I/O In: -  Out: 20 [Drains:20]  PE: Abd: soft, tender, NGT in place with almost no output.  Wound is clean and packed.  JP drain with serosang, more sanguinous than serous.  Lungs: CTAB Heart: regular  Lab Results:   Basename 06/06/11 0630 06/06/11 0208  WBC 16.1* 16.4*  HGB 10.0* 10.2*  HCT 29.8* 30.7*  PLT 220 227   BMET  Basename 06/06/11 0630 06/06/11 0208  NA 137 137  K 3.3* 3.2*  CL 104 103  CO2 27 27  GLUCOSE 179* 150*  BUN 10 10  CREATININE 0.64 0.62  CALCIUM 8.1* 8.3*   PT/INR No results found for this basename: LABPROT:2,INR:2 in the last 72 hours CMP     Component Value Date/Time   NA 137 06/06/2011 0630   K 3.3* 06/06/2011 0630   CL 104 06/06/2011 0630   CO2 27 06/06/2011 0630   GLUCOSE 179* 06/06/2011 0630   BUN 10 06/06/2011 0630   CREATININE 0.64 06/06/2011 0630   CALCIUM 8.1* 06/06/2011 0630   GFRNONAA >90 06/06/2011 0630   GFRAA >90 06/06/2011 0630   Lipase     Component Value Date/Time   LIPASE 21 06/05/2011 1516       Studies/Results: Ct Abdomen Pelvis W Contrast  06/05/2011  *RADIOLOGY REPORT*  Clinical Data: .  Nausea and vomiting.  Fever.  CT ABDOMEN AND PELVIS WITH  CONTRAST  Technique:  Multidetector CT imaging of the abdomen and pelvis was performed following the standard protocol during bolus administration of intravenous contrast.  Contrast:  100 ml Omnipaque-300  Comparison: Report from 12/11/2000  Findings: A 3 mm nodule in the left lower lobe on image 9 of series 3 was described on the prior CT scan from 2002 and accordingly is highly likely to be benign.  Lingular subsegmental atelectasis or scarring noted.  Diffuse hepatic steatosis is present.  There are several hypodensities in the left hepatic lobe which are technically too small to characterize.  A right hepatic lobe partially exophytic cyst measures 9.3 x 7.9 cm on image 40 of series 2.  Multiple gallstones are present in the gallbladder, measuring up to 0.7 cm in diameter.  The spleen, pancreas, and adrenal glands appear normal.  Orally administered contrast extends through to the cecum.  The kidneys appear unremarkable, as do the proximal ureters.  There is abnormal right lower quadrant stranding in the vicinity of the appendix, with a small amount of adjacent extraluminal gas. However, there is also some adjacent wall thickening of the terminal ileum in this vicinity, and portions of the appendix do not appear thickened.  Although I favor acute appendicitis, possibly with a  small microperforation, the possibility of focal terminal ileitis is not totally excluded.  Urinary bladder and ureters appear unremarkable.  Clips from prior lymph node dissection noted along the iliac chains.  Currently no pathologic iliac or pelvic adenopathy is noted and small mesenteric lymph nodes are probably reactive.  No omental studding.  Sclerotic pubic bodies noted.  IMPRESSION:  1. Suspected acute appendicitis with focal microperforation. Adjacent distal ileum wall thickening is probably secondary, and is less likely to be the underlying cause of the localized inflammation in the right lower quadrant. 2.  Large right hepatic  lobe cyst.  3.  Cholelithiasis. 4.  Osteitis pubis. 5.  Chronic 3 mm left lower lobe nodule, benign. 6.  Diffuse hepatic steatosis.  Original Report Authenticated By: Dellia Cloud, M.D.   Dg Or Local Abdomen  06/06/2011  *RADIOLOGY REPORT*  Clinical Data: Lap.  Eight appendectomy converted to open procedure.  OR LOCAL ABDOMEN  Comparison: None.  Findings: Supine abdomen at 0100 hours shows contrast material within the nondilated colon.  Surgical drain overlies the pelvis. Multiple skin staples are evident and surgical clips are seen along the lower retroperitoneal region.  There is no evidence for unexpected radiopaque foreign body.  IMPRESSION: No unexpected retained radiopaque foreign body.  Original Report Authenticated By: ERIC A. MANSELL, M.D.    Anti-infectives: Anti-infectives     Start     Dose/Rate Route Frequency Ordered Stop   06/06/11 0600   ertapenem (INVANZ) 1 g in sodium chloride 0.9 % 50 mL IVPB        1 g 100 mL/hr over 30 Minutes Intravenous Every 24 hours 06/06/11 0121     06/05/11 2130   Ampicillin-Sulbactam (UNASYN) 3 g in sodium chloride 0.9 % 100 mL IVPB  Status:  Discontinued        3 g 100 mL/hr over 60 Minutes Intravenous Every 6 hours 06/05/11 2043 06/06/11 0130   06/05/11 2100   Ampicillin-Sulbactam (UNASYN) 3 g in sodium chloride 0.9 % 100 mL IVPB  Status:  Discontinued        3 g 100 mL/hr over 60 Minutes Intravenous  Once 06/05/11 2054 06/06/11 0130   06/05/11 2045   Ampicillin-Sulbactam (UNASYN) 3 g in sodium chloride 0.9 % 100 mL IVPB  Status:  Discontinued        3 g 100 mL/hr over 60 Minutes Intravenous On call to O.R. 06/05/11 2043 06/05/11 2051           Assessment/Plan  1. S/p open appy 2. Desaturation, unknown cause  Plan: 1. Will move to step down given desaturation issues on non-rebreather 2. May need bolus given pressure a little soft.  uop is 675 but dark in nature as well.  Leave foley for now. 3. Will get chest x-ray to eval  lungs 4. At some point will start VAC to wound instead of NS WD dressing changes. 5. Will start heparin. 6. Check labs in the morning.   LOS: 1 day    , E 06/06/2011

## 2011-06-06 NOTE — Progress Notes (Signed)
Events noted. Vs reviewed. cxr reviewed  When off NRB, desat to 88%. If pt takes a deep breath, O2 sat >90%  Alert, ox3, appropriate. On NRB cta with some decreased bs at bases Reg Soft, distension. approp ttp +scds.  Agree with tx to sdu for closer observation Try to wean to ventimask Cont iv abx for min 7 days for perf appendicitis Start chemical VTE prophy Low suspicion for PE now. No tachy Cont bowel rest. May have sips of water, ice chips  Mary Sella. Andrey Campanile, MD, FACS General, Bariatric, & Minimally Invasive Surgery [facility], [place]

## 2011-06-06 NOTE — OR Nursing (Signed)
Dr. Stefanie Libel called the abdominal x-ray report to OR room 16.  He stated there were no unexpected radiopaque foreign body present.

## 2011-06-06 NOTE — Progress Notes (Signed)
Pt" o2 sat is fluctuate, O2 sat 87-88% with [place] 4L/M,  Notified MD, ok to use simple mask to keeo O2 sat >92 % per Dr. Lindie Spruce

## 2011-06-06 NOTE — Progress Notes (Signed)
Pt O2 sats fluctuating between 88-90 % on 50% venti mask. Called respiratory and rapid response to evaluate pt. Pt placed on 60% NRB, O2 sats in the 90's.

## 2011-06-06 NOTE — ED Provider Notes (Signed)
Medical screening examination/treatment/procedure(s) were conducted as a shared visit with non-physician practitioner(s) and myself.  I personally evaluated the patient during the encounter Pt with abdominal pain, RLQ tenderness, WBC 19,800, and CT of the abdomen showing acute appendicitis with microperforation. Call to Dr. Lindie Spruce, on call for general surgery, and he will be down to see her.   Carleene Cooper III, MD 06/06/11 [phone]

## 2011-06-06 NOTE — Anesthesia Postprocedure Evaluation (Signed)
  Anesthesia Post-op Note  Patient: Meredith Rose  Procedure(s) Performed: Procedure(s) (LRB): APPENDECTOMY ()  Patient Location: PACU  Anesthesia Type: General  Level of Consciousness: awake  Airway and Oxygen Therapy: Patient Spontanous Breathing  Post-op Pain: mild  Post-op Assessment: Post-op Vital signs reviewed, Patient's Cardiovascular Status Stable, Respiratory Function Stable, Patent Airway, No signs of Nausea or vomiting and Pain level controlled  Post-op Vital Signs: stable  Complications: No apparent anesthesia complications

## 2011-06-06 NOTE — Progress Notes (Signed)
Report  Called to [place], [place] on 2900. Transferred to 2923 accompanied by spouse.Meredith Rose 06/06/2011

## 2011-06-06 NOTE — Progress Notes (Signed)
Called by bedside RN to assess patient's respiratory status, upon assessment patient lethargic, oriented, clear bilateral lung sounds and in no acute distress. Patient nods off quickly and O2 sats drop to 88%, increased O2 to partial NRB at 60%, advised bedside RN to call if needed, will continue to monitor.

## 2011-06-07 LAB — BASIC METABOLIC PANEL
CO2: 29 mEq/L (ref 19–32)
Chloride: 105 mEq/L (ref 96–112)
Creatinine, Ser: 0.78 mg/dL (ref 0.50–1.10)
GFR calc Af Amer: >90 mL/min (ref >90)
Potassium: 3.4 mEq/L — ABNORMAL LOW (ref 3.5–5.1)

## 2011-06-07 LAB — CBC
MCV: 87.5 fL (ref 78.0–100.0)
Platelets: 258 10*3/uL (ref 150–400)
RBC: 3.2 MIL/uL — ABNORMAL LOW (ref 3.87–5.11)
RDW: 15 % (ref 11.5–15.5)
WBC: 11.6 10*3/uL — ABNORMAL HIGH (ref 4.0–10.5)

## 2011-06-07 MED ORDER — SODIUM CHLORIDE 0.9 % IJ SOLN
3.0000 mL | Freq: Two times a day (BID) | INTRAMUSCULAR | Status: DC
  Administered 2011-06-07 – 2011-06-11 (×5): 3 mL via INTRAVENOUS

## 2011-06-07 NOTE — Progress Notes (Signed)
Patient ID: Meredith Rose, female   DOB: 1950-11-21, 61 y.o.   MRN: [id number] 2 Days Post-Op  Subjective: Pt c/o some pain today.  No flatus yet.  Objective: Vital signs in last 24 hours: Temp:  [98.7 F (37.1 C)-102.8 F (39.3 C)] 99.3 F (37.4 C) (05/08 0700) Pulse Rate:  [70-86] 83  (05/08 0900) Resp:  [8-25] 15  (05/08 0900) BP: (100-129)/(47-73) 123/64 mmHg (05/08 0900) SpO2:  [89 %-99 %] 90 % (05/08 0900) FiO2 (%):  [40 %] 40 % (05/08 0700) Weight:  [230 lb 9.6 oz (104.6 kg)] 230 lb 9.6 oz (104.6 kg) (05/08 0300) Last BM Date: 06/05/11  Intake/Output from previous day: 05/07 0701 - 05/08 0700 In: 3097.8 [I.V.:3067.8] Out: 1095 [Urine:725; Emesis/NG output:350; Drains:20] Intake/Output this shift: Total I/O In: 250 [I.V.:250] Out: 200 [Urine:200]  PE: Abd: soft, -bs, ND, obese, wound still packed with original packing despite orders written for BID dressing changes.  JP with serosang output.  NGT with minimal bilious output.  Lab Results:   Basename 06/07/11 0600 06/06/11 0630  WBC 11.6* 16.1*  HGB 8.8* 10.0*  HCT 28.0* 29.8*  PLT 258 220   BMET  Basename 06/07/11 0600 06/06/11 0630  NA 140 137  K 3.4* 3.3*  CL 105 104  CO2 29 27  GLUCOSE 119* 179*  BUN 10 10  CREATININE 0.78 0.64  CALCIUM 7.9* 8.1*   PT/INR No results found for this basename: LABPROT:2,INR:2 in the last 72 hours CMP     Component Value Date/Time   NA 140 06/07/2011 0600   K 3.4* 06/07/2011 0600   CL 105 06/07/2011 0600   CO2 29 06/07/2011 0600   GLUCOSE 119* 06/07/2011 0600   BUN 10 06/07/2011 0600   CREATININE 0.78 06/07/2011 0600   CALCIUM 7.9* 06/07/2011 0600   GFRNONAA 88* 06/07/2011 0600   GFRAA >90 06/07/2011 0600   Lipase     Component Value Date/Time   LIPASE 21 06/05/2011 1516       Studies/Results: Ct Abdomen Pelvis W Contrast  06/05/2011  *RADIOLOGY REPORT*  Clinical Data: .  Nausea and vomiting.  Fever.  CT ABDOMEN AND PELVIS WITH CONTRAST  Technique:  Multidetector CT  imaging of the abdomen and pelvis was performed following the standard protocol during bolus administration of intravenous contrast.  Contrast:  100 ml Omnipaque-300  Comparison: Report from 12/11/2000  Findings: A 3 mm nodule in the left lower lobe on image 9 of series 3 was described on the prior CT scan from 2002 and accordingly is highly likely to be benign.  Lingular subsegmental atelectasis or scarring noted.  Diffuse hepatic steatosis is present.  There are several hypodensities in the left hepatic lobe which are technically too small to characterize.  A right hepatic lobe partially exophytic cyst measures 9.3 x 7.9 cm on image 40 of series 2.  Multiple gallstones are present in the gallbladder, measuring up to 0.7 cm in diameter.  The spleen, pancreas, and adrenal glands appear normal.  Orally administered contrast extends through to the cecum.  The kidneys appear unremarkable, as do the proximal ureters.  There is abnormal right lower quadrant stranding in the vicinity of the appendix, with a small amount of adjacent extraluminal gas. However, there is also some adjacent wall thickening of the terminal ileum in this vicinity, and portions of the appendix do not appear thickened.  Although I favor acute appendicitis, possibly with a small microperforation, the possibility of focal terminal ileitis is not totally  excluded.  Urinary bladder and ureters appear unremarkable.  Clips from prior lymph node dissection noted along the iliac chains.  Currently no pathologic iliac or pelvic adenopathy is noted and small mesenteric lymph nodes are probably reactive.  No omental studding.  Sclerotic pubic bodies noted.  IMPRESSION:  1. Suspected acute appendicitis with focal microperforation. Adjacent distal ileum wall thickening is probably secondary, and is less likely to be the underlying cause of the localized inflammation in the right lower quadrant. 2.  Large right hepatic lobe cyst.  3.  Cholelithiasis. 4.   Osteitis pubis. 5.  Chronic 3 mm left lower lobe nodule, benign. 6.  Diffuse hepatic steatosis.  Original Report Authenticated By: Dellia Cloud, M.D.   Dg Chest Port 1 View  06/06/2011  *RADIOLOGY REPORT*  Clinical Data: Short of breath  PORTABLE CHEST - 1 VIEW  Comparison: None  Findings: The nasogastric tube tip is below the GE junction.  There is mild cardiac enlargement.  Lung volumes appear low slight asymmetric elevation of the right hemidiaphragm noted.  There is mild plate-like atelectasis in the lung bases.  IMPRESSION:  1.  Low lung volumes and bibasilar atelectasis.  Original Report Authenticated By: Rosealee Albee, M.D.   Dg Or Local Abdomen  06/06/2011  *RADIOLOGY REPORT*  Clinical Data: Lap.  Eight appendectomy converted to open procedure.  OR LOCAL ABDOMEN  Comparison: None.  Findings: Supine abdomen at 0100 hours shows contrast material within the nondilated colon.  Surgical drain overlies the pelvis. Multiple skin staples are evident and surgical clips are seen along the lower retroperitoneal region.  There is no evidence for unexpected radiopaque foreign body.  IMPRESSION: No unexpected retained radiopaque foreign body.  Original Report Authenticated By: ERIC A. MANSELL, M.D.    Anti-infectives: Anti-infectives     Start     Dose/Rate Route Frequency Ordered Stop   06/06/11 0600   ertapenem (INVANZ) 1 g in sodium chloride 0.9 % 50 mL IVPB        1 g 100 mL/hr over 30 Minutes Intravenous Every 24 hours 06/06/11 0121     06/05/11 2130   Ampicillin-Sulbactam (UNASYN) 3 g in sodium chloride 0.9 % 100 mL IVPB  Status:  Discontinued        3 g 100 mL/hr over 60 Minutes Intravenous Every 6 hours 06/05/11 2043 06/06/11 0130   06/05/11 2100   Ampicillin-Sulbactam (UNASYN) 3 g in sodium chloride 0.9 % 100 mL IVPB  Status:  Discontinued        3 g 100 mL/hr over 60 Minutes Intravenous  Once 06/05/11 2054 06/06/11 0130   06/05/11 2045   Ampicillin-Sulbactam (UNASYN) 3 g in  sodium chloride 0.9 % 100 mL IVPB  Status:  Discontinued        3 g 100 mL/hr over 60 Minutes Intravenous On call to O.R. 06/05/11 2043 06/05/11 2051           Assessment/Plan  1. S/p open appy 2. O2 desat, likely secondary to atelectasis 3. Post op ileus  Plan: 1. Minimal NGT output.  Will clamp for 6 hrs, if no nausea will dc NGT and keep NPO 2.  Will d/w MD regarding foley. 3. Will apply wound VAC today 4. Transfer to the floor 5. Cont pulm toilet  LOS: 2 days    , E 06/07/2011

## 2011-06-07 NOTE — Progress Notes (Signed)
Total mg of dilaudid received = 1.99mg  with 11 demands and 9 delivered.   Pt remains with little uop post 500cc NS bolus, bladder scan done with a volume of 159 in the bladder, on call MD ( Dr. Johna Sheriff) notified.  New order received.  MD repaged due to high fever 102.8, order for tylenol received but stressed to give if pt c/o being uncomfortable. Cold compress applied to head and underarm, will monitor.

## 2011-06-07 NOTE — Progress Notes (Signed)
Pt's  NGT removed as ordered, no episodes of vomiting, NPO except ice chips maintained

## 2011-06-07 NOTE — Progress Notes (Signed)
M. , MD, FACS General, Bariatric, & Minimally Invasive Surgery [facility] [place]  

## 2011-06-08 LAB — CBC
HCT: 27.5 % — ABNORMAL LOW (ref 36.0–46.0)
Hemoglobin: 8.7 g/dL — ABNORMAL LOW (ref 12.0–15.0)
MCV: 88.1 fL (ref 78.0–100.0)
RBC: 3.12 MIL/uL — ABNORMAL LOW (ref 3.87–5.11)
RDW: 15 % (ref 11.5–15.5)
WBC: 11.5 10*3/uL — ABNORMAL HIGH (ref 4.0–10.5)

## 2011-06-08 LAB — BASIC METABOLIC PANEL
BUN: 8 mg/dL (ref 6–23)
CO2: 28 mEq/L (ref 19–32)
Chloride: 106 mEq/L (ref 96–112)
Creatinine, Ser: 0.66 mg/dL (ref 0.50–1.10)
GFR calc Af Amer: >90 mL/min (ref >90)
Glucose, Bld: 117 mg/dL — ABNORMAL HIGH (ref 70–99)
Potassium: 3.5 mEq/L (ref 3.5–5.1)

## 2011-06-08 MED ORDER — LEVOTHYROXINE SODIUM 112 MCG PO TABS
112.0000 ug | ORAL_TABLET | Freq: Every day | ORAL | Status: DC
  Administered 2011-06-08 – 2011-06-12 (×5): 112 ug via ORAL
  Filled 2011-06-08 (×8): qty 1

## 2011-06-08 NOTE — Care Management Note (Signed)
    Page 1 of 1   06/08/2011     10:52:16 AM   CARE MANAGEMENT NOTE 06/08/2011  Patient:  Meredith Rose, Meredith Rose   Account Number:  [id number]  Date Initiated:  06/06/2011  Documentation initiated by:  Carlyle Lipa  Subjective/Objective Assessment:   abd pain; open appendectomy; increased O2 need so moved to stepdown     Action/Plan:   await stability to determine actual needs   Anticipated DC Date:  06/11/2011   Anticipated DC Plan:  HOME W HOME HEALTH SERVICES      DC Planning Services  CM consult      Choice offered to / List presented to:  C-1 Patient           Status of service:  In process, will continue to follow Medicare Important Message given?   (If response is "NO", the following Medicare IM given date fields will be blank) Date Medicare IM given:   Date Additional Medicare IM given:    Discharge Disposition:    Per UR Regulation:  Reviewed for med. necessity/level of care/duration of stay  If discussed at Long Length of Stay Meetings, dates discussed:    Comments:  06-08-11 referral : please give pt option of [facility] vs [facility], I prefer [facility] if pt agreeable. The work on approval for home VAC.  Spoke with patient explained chose of [facility] and [facility] wound pressure system. Gave patient [facility] home health list .  Patient choose [facility] for equipment and nursing. Will leave form on chart for MD to sign .  Barnetta Chapel PA aware. Ronny Flurry RN BSN

## 2011-06-08 NOTE — Progress Notes (Signed)
Wound care nurse made aware about wound vac order.

## 2011-06-08 NOTE — Progress Notes (Signed)
VAC dressing/supply ordered and available at bedside. Per report WOC  nurse is aware of need to change and apply VAC dressing. Will follow up in the morning.  K.  Charity fundraiser

## 2011-06-08 NOTE — Progress Notes (Signed)
Patient ID: Meredith Rose, female   DOB: August 20, 1950, 61 y.o.   MRN: [id number] 3 Days Post-Op  Subjective: Pt still with some pain, but improved.  No flatus yet.  No nausea or vomiting with NGT out.  Denies SOB  Objective: Vital signs in last 24 hours: Temp:  [97.8 F (36.6 C)-101.6 F (38.7 C)] 97.8 F (36.6 C) (05/09 0635) Pulse Rate:  [69-84] 69  (05/09 0635) Resp:  [15-21] 17  (05/09 0738) BP: (112-134)/(53-68) 112/53 mmHg (05/09 0635) SpO2:  [90 %-96 %] 92 % (05/09 0738) Last BM Date: 06/05/11  Intake/Output from previous day: 05/08 0701 - 05/09 0700 In: 1125 [I.V.:1125] Out: 1450 [Urine:1375; Emesis/NG output:50; Drains:25] Intake/Output this shift:    PE: Abd: soft, -bs, ND, still appropriately tender, wound with packing in place, getting ready to get VAC placed. Heart: regular Lungs: CTAB  Lab Results:   Basename 06/08/11 0600 06/07/11 0600  WBC 11.5* 11.6*  HGB 8.7* 8.8*  HCT 27.5* 28.0*  PLT 265 258   BMET  Basename 06/08/11 0600 06/07/11 0600  NA 140 140  K 3.5 3.4*  CL 106 105  CO2 28 29  GLUCOSE 117* 119*  BUN 8 10  CREATININE 0.66 0.78  CALCIUM 8.1* 7.9*   PT/INR No results found for this basename: LABPROT:2,INR:2 in the last 72 hours CMP     Component Value Date/Time   NA 140 06/08/2011 0600   K 3.5 06/08/2011 0600   CL 106 06/08/2011 0600   CO2 28 06/08/2011 0600   GLUCOSE 117* 06/08/2011 0600   BUN 8 06/08/2011 0600   CREATININE 0.66 06/08/2011 0600   CALCIUM 8.1* 06/08/2011 0600   GFRNONAA >90 06/08/2011 0600   GFRAA >90 06/08/2011 0600   Lipase     Component Value Date/Time   LIPASE 21 06/05/2011 1516       Studies/Results: Dg Chest Port 1 View  06/06/2011  *RADIOLOGY REPORT*  Clinical Data: Short of breath  PORTABLE CHEST - 1 VIEW  Comparison: None  Findings: The nasogastric tube tip is below the GE junction.  There is mild cardiac enlargement.  Lung volumes appear low slight asymmetric elevation of the right hemidiaphragm noted.  There is  mild plate-like atelectasis in the lung bases.  IMPRESSION:  1.  Low lung volumes and bibasilar atelectasis.  Original Report Authenticated By: Rosealee Albee, M.D.    Anti-infectives: Anti-infectives     Start     Dose/Rate Route Frequency Ordered Stop   06/06/11 0600   ertapenem (INVANZ) 1 g in sodium chloride 0.9 % 50 mL IVPB        1 g 100 mL/hr over 30 Minutes Intravenous Every 24 hours 06/06/11 0121     06/05/11 2130   Ampicillin-Sulbactam (UNASYN) 3 g in sodium chloride 0.9 % 100 mL IVPB  Status:  Discontinued        3 g 100 mL/hr over 60 Minutes Intravenous Every 6 hours 06/05/11 2043 06/06/11 0130   06/05/11 2100   Ampicillin-Sulbactam (UNASYN) 3 g in sodium chloride 0.9 % 100 mL IVPB  Status:  Discontinued        3 g 100 mL/hr over 60 Minutes Intravenous  Once 06/05/11 2054 06/06/11 0130   06/05/11 2045   Ampicillin-Sulbactam (UNASYN) 3 g in sodium chloride 0.9 % 100 mL IVPB  Status:  Discontinued        3 g 100 mL/hr over 60 Minutes Intravenous On call to O.R. 06/05/11 2043 06/05/11 2051  Assessment/Plan  1. S/p open appy 2. Post op ileus 3. Atelectasis  Plan: 1. Dc foley today 2. Try to wean O2 today, cont pulm toilet 3. Place VAC on wound 4. Cont NPO and await bowel function  5. Ambulate in halls TID  LOS: 3 days    , E 06/08/2011

## 2011-06-08 NOTE — Progress Notes (Signed)
Feels a little nauseous; +flatus abd soft, mild distension, wound vac in place, mild expected TTP  If cont to have flatus- clears Friday Iv abx for 1 week  Mary Sella. Andrey Campanile, MD, FACS General, Bariatric, & Minimally Invasive Surgery [facility], [place]

## 2011-06-08 NOTE — Consult Note (Signed)
WOC consult Note Reason for Consult: initial application of NPWT VAC dressing to open surgical wound. S/P appendectomy.  Wound type: surgical wound Measurement: 2.0cm x 14cm x 3.5cm  Wound bed: adipose tissue present Drainage (amount, consistency, odor) moderate serosanguinous on packing from previous dressing. Periwound: intact without problems Dressing procedure/placement/frequency: 1pc black granufoam placed in wound bed.  Seal obtained at continuous, pt tolerated without problems.  Will have bedside nursing change on Saturday and then back to M/W/F schedule.  WOC will follow up with bedside nursing as needed for dressing assistance.   [address] RN, [place] [phone]

## 2011-06-09 MED ORDER — PANTOPRAZOLE SODIUM 40 MG PO TBEC
40.0000 mg | DELAYED_RELEASE_TABLET | Freq: Every day | ORAL | Status: DC
  Administered 2011-06-09 – 2011-06-11 (×3): 40 mg via ORAL
  Filled 2011-06-09 (×3): qty 1

## 2011-06-09 MED ORDER — ACETAMINOPHEN 325 MG PO TABS
650.0000 mg | ORAL_TABLET | Freq: Four times a day (QID) | ORAL | Status: DC | PRN
  Administered 2011-06-11: 650 mg via ORAL
  Filled 2011-06-09: qty 2

## 2011-06-09 NOTE — Progress Notes (Signed)
4 Days Post-Op  Subjective: + flatus  Objective: Vital signs in last 24 hours: Temp:  [97.8 F (36.6 C)-98.2 F (36.8 C)] 98.1 F (36.7 C) (05/10 0600) Pulse Rate:  [67-80] 69  (05/10 0600) Resp:  [16-21] 18  (05/10 0747) BP: (112-135)/(52-67) 135/56 mmHg (05/10 0600) SpO2:  [90 %-98 %] 92 % (05/10 0747) Last BM Date: 06/03/11  Intake/Output from previous day: 05/09 0701 - 05/10 0700 In: 3047 [P.O.:60; I.V.:2987] Out: 1680 [Urine:1650; Drains:30] Intake/Output this shift:    General appearance: alert, cooperative and no distress Resp: clear to auscultation bilaterally Cardio: regular rate and rhythm GI: soft, ND, +BS, VAC in place RLQ  Lab Results:   Basename 06/08/11 0600 06/07/11 0600  WBC 11.5* 11.6*  HGB 8.7* 8.8*  HCT 27.5* 28.0*  PLT 265 258   BMET  Basename 06/08/11 0600 06/07/11 0600  NA 140 140  K 3.5 3.4*  CL 106 105  CO2 28 29  GLUCOSE 117* 119*  BUN 8 10  CREATININE 0.66 0.78  CALCIUM 8.1* 7.9*   PT/INR No results found for this basename: LABPROT:2,INR:2 in the last 72 hours ABG No results found for this basename: PHART:2,PCO2:2,PO2:2,HCO3:2 in the last 72 hours  Studies/Results: No results found.  Anti-infectives: Anti-infectives     Start     Dose/Rate Route Frequency Ordered Stop   06/06/11 0600   ertapenem (INVANZ) 1 g in sodium chloride 0.9 % 50 mL IVPB        1 g 100 mL/hr over 30 Minutes Intravenous Every 24 hours 06/06/11 0121 06/13/11 0559   06/05/11 2130   Ampicillin-Sulbactam (UNASYN) 3 g in sodium chloride 0.9 % 100 mL IVPB  Status:  Discontinued        3 g 100 mL/hr over 60 Minutes Intravenous Every 6 hours 06/05/11 2043 06/06/11 0130   06/05/11 2100   Ampicillin-Sulbactam (UNASYN) 3 g in sodium chloride 0.9 % 100 mL IVPB  Status:  Discontinued        3 g 100 mL/hr over 60 Minutes Intravenous  Once 06/05/11 2054 06/06/11 0130   06/05/11 2045   Ampicillin-Sulbactam (UNASYN) 3 g in sodium chloride 0.9 % 100 mL IVPB  Status:   Discontinued        3 g 100 mL/hr over 60 Minutes Intravenous On call to O.R. 06/05/11 2043 06/05/11 2051          Assessment/Plan: s/p Procedure(s) (LRB): APPENDECTOMY () S/P open appy Ileus - improving, start clears Resp - try to wean O2 ID - Invanz Ambulate VTE - heparin  LOS: 4 days    , E 06/09/2011

## 2011-06-10 DIAGNOSIS — F419 Anxiety disorder, unspecified: Secondary | ICD-10-CM | POA: Diagnosis present

## 2011-06-10 DIAGNOSIS — I10 Essential (primary) hypertension: Secondary | ICD-10-CM | POA: Diagnosis present

## 2011-06-10 DIAGNOSIS — E039 Hypothyroidism, unspecified: Secondary | ICD-10-CM | POA: Insufficient documentation

## 2011-06-10 LAB — CBC
HCT: 31.9 % — ABNORMAL LOW (ref 36.0–46.0)
Hemoglobin: 10.5 g/dL — ABNORMAL LOW (ref 12.0–15.0)
MCV: 85.5 fL (ref 78.0–100.0)
RBC: 3.73 MIL/uL — ABNORMAL LOW (ref 3.87–5.11)
RDW: 14.2 % (ref 11.5–15.5)
WBC: 10.3 10*3/uL (ref 4.0–10.5)

## 2011-06-10 LAB — BASIC METABOLIC PANEL
BUN: <3 mg/dL — ABNORMAL LOW (ref 6–23)
CO2: 28 mEq/L (ref 19–32)
Chloride: 104 mEq/L (ref 96–112)
Creatinine, Ser: 0.55 mg/dL (ref 0.50–1.10)
Glucose, Bld: 102 mg/dL — ABNORMAL HIGH (ref 70–99)

## 2011-06-10 MED ORDER — PROPRANOLOL HCL 10 MG PO TABS
10.0000 mg | ORAL_TABLET | Freq: Every day | ORAL | Status: DC
  Administered 2011-06-10 – 2011-06-12 (×3): 10 mg via ORAL
  Filled 2011-06-10 (×3): qty 1

## 2011-06-10 MED ORDER — LOSARTAN POTASSIUM-HCTZ 100-25 MG PO TABS: 1.0000 | ORAL_TABLET | Freq: Every day | ORAL | Status: DC

## 2011-06-10 MED ORDER — NAPROXEN 500 MG PO TABS: 500.0000 mg | ORAL_TABLET | Freq: Two times a day (BID) | ORAL | Status: DC

## 2011-06-10 MED ORDER — PSYLLIUM 95 % PO PACK
1.0000 | PACK | Freq: Two times a day (BID) | ORAL | Status: DC
  Administered 2011-06-10 – 2011-06-12 (×5): 1 via ORAL
  Filled 2011-06-10 (×6): qty 1

## 2011-06-10 MED ORDER — CELECOXIB 200 MG PO CAPS
200.0000 mg | ORAL_CAPSULE | Freq: Every day | ORAL | Status: DC
  Administered 2011-06-11: 200 mg via ORAL
  Filled 2011-06-10 (×3): qty 1

## 2011-06-10 MED ORDER — ADULT MULTIVITAMIN W/MINERALS CH
1.0000 | ORAL_TABLET | Freq: Every day | ORAL | Status: DC
Start: 2011-06-10 — End: 2011-06-10

## 2011-06-10 MED ORDER — LOSARTAN POTASSIUM 50 MG PO TABS
100.0000 mg | ORAL_TABLET | Freq: Every day | ORAL | Status: DC
  Administered 2011-06-10 – 2011-06-12 (×3): 100 mg via ORAL
  Filled 2011-06-10 (×3): qty 2

## 2011-06-10 MED ORDER — ADULT MULTIVITAMIN W/MINERALS CH
1.0000 | ORAL_TABLET | Freq: Every day | ORAL | Status: DC
  Administered 2011-06-10 – 2011-06-12 (×3): 1 via ORAL
  Filled 2011-06-10 (×3): qty 1

## 2011-06-10 MED ORDER — BISACODYL 10 MG RE SUPP: 10.0000 mg | Freq: Two times a day (BID) | RECTAL | Status: DC | PRN

## 2011-06-10 MED ORDER — HYDROCHLOROTHIAZIDE 25 MG PO TABS
25.0000 mg | ORAL_TABLET | Freq: Every day | ORAL | Status: DC
  Administered 2011-06-10 – 2011-06-12 (×3): 25 mg via ORAL
  Filled 2011-06-10 (×3): qty 1

## 2011-06-10 MED ORDER — OXYCODONE HCL 5 MG PO TABS
5.0000 mg | ORAL_TABLET | ORAL | Status: DC | PRN
  Administered 2011-06-11 (×2): 10 mg via ORAL
  Administered 2011-06-12: 5 mg via ORAL
  Filled 2011-06-10 (×3): qty 2

## 2011-06-10 MED ORDER — SACCHAROMYCES BOULARDII 250 MG PO CAPS
250.0000 mg | ORAL_CAPSULE | Freq: Two times a day (BID) | ORAL | Status: DC
  Administered 2011-06-10 – 2011-06-12 (×5): 250 mg via ORAL
  Filled 2011-06-10 (×6): qty 1

## 2011-06-10 MED ORDER — ALUM & MAG HYDROXIDE-SIMETH 200-200-20 MG/5ML PO SUSP: 30.0000 mL | Freq: Four times a day (QID) | ORAL | Status: DC | PRN

## 2011-06-10 MED ORDER — HYDROMORPHONE 0.3 MG/ML IV SOLN
INTRAVENOUS | Status: DC
  Administered 2011-06-10: 2 mg via INTRAVENOUS

## 2011-06-10 MED ORDER — LIP MEDEX EX OINT
1.0000 "application " | TOPICAL_OINTMENT | Freq: Two times a day (BID) | CUTANEOUS | Status: DC
  Filled 2011-06-10: qty 7

## 2011-06-10 MED ORDER — BLISTEX EX OINT
TOPICAL_OINTMENT | Freq: Two times a day (BID) | CUTANEOUS | Status: DC
  Administered 2011-06-10 – 2011-06-12 (×5): via TOPICAL
  Filled 2011-06-10: qty 10

## 2011-06-10 MED ORDER — MAGIC MOUTHWASH
15.0000 mL | Freq: Four times a day (QID) | ORAL | Status: DC | PRN
  Filled 2011-06-10: qty 15

## 2011-06-10 NOTE — Plan of Care (Signed)
Problem: Phase I Progression Outcomes Goal: OOB as tolerated unless otherwise ordered Outcome: Completed/Met Date Met:  06/10/11 With assist because of IV pole

## 2011-06-10 NOTE — Progress Notes (Signed)
Pt on room air sats at 96%. Ambulated 200 feet and tolerated well.

## 2011-06-10 NOTE — Progress Notes (Signed)
Meredith Rose [id number] [birth year]  CARE TEAM:  PCP: Astrid Divine, MD, MD  Outpatient Care Team: Patient Care Team: Maurice Small, MD as PCP - General (Family Medicine)  Inpatient Treatment Team: Treatment Team: Attending Provider: Bishop Limbo, MD; Rounding Team: Md Montez Morita, MD; Technician: Roslynn Amble, NT; Registered Nurse: Corena Herter, RN; Registered Nurse: Vassie Loll, RN; Registered Nurse: Wynona Neat, RN; Technician: Milus Glazier, NT; Registered Nurse: Jacquelyne Balint, RN  Subjective:  Sore Wants to get up but not walking much   Objective:  Vital signs:  Filed Vitals:   06/10/11 0213 06/10/11 0400 06/10/11 0500 06/10/11 0733  BP: 133/55  124/65   Pulse: 72  79   Temp: 97.7 F (36.5 C)  97.8 F (36.6 C)   TempSrc:      Resp: 18 18 18 16   Height:      Weight:      SpO2: 98% 94% 95% 96%    Last BM Date: 06/10/11  Intake/Output   Yesterday:  05/10 0701 - 05/11 0700 In: 2979.8 [P.O.:120; I.V.:2609.8; IV Piggyback:250] Out: 4461 [Urine:4200; Drains:260; Stool:1] This shift:  Total I/O In: -  Out: 20 [Drains:20]  Bowel function:  Flatus: y  BM: y  Drain - serosanguinous  Physical Exam:  General: Pt groggy at first but then more alert, oriented x4 in no acute distress Eyes: PERRL, normal EOM.  Sclera clear.  No icterus Neuro: CN II-XII intact w/o focal sensory/motor deficits. Lymph: No head/neck/groin lymphadenopathy Psych:  No delerium/psychosis/paranoia HENT: Normocephalic, Mucus membranes moist.  No thrush Neck: Supple, No tracheal deviation Chest: No chest wall pain w good excursion CV:  Pulses intact.  Regular rhythm Abdomen: Soft.  Nondistended.  Mildly tender at RLQ incision.  Wound vac clean.  No incarcerated hernias. Ext:  SCDs BLE.  No mjr edema.  No cyanosis Skin: No petechiae / purpurae  Results:   Labs: Results for orders placed during the hospital encounter of 06/05/11 (from the past 48  hour(s))  CBC     Status: Abnormal   Collection Time   06/10/11  7:00 AM      Component Value Range Comment   WBC 10.3  4.0 - 10.5 (K/uL)    RBC 3.73 (*) 3.87 - 5.11 (MIL/uL)    Hemoglobin 10.5 (*) 12.0 - 15.0 (g/dL)    HCT 30.8 (*) 65.7 - 46.0 (%)    MCV 85.5  78.0 - 100.0 (fL)    MCH 28.2  26.0 - 34.0 (pg)    MCHC 32.9  30.0 - 36.0 (g/dL)    RDW 84.6  96.2 - 95.2 (%)    Platelets 336  150 - 400 (K/uL)   BASIC METABOLIC PANEL     Status: Abnormal   Collection Time   06/10/11  7:00 AM      Component Value Range Comment   Sodium 142  135 - 145 (mEq/L)    Potassium 3.5  3.5 - 5.1 (mEq/L)    Chloride 104  96 - 112 (mEq/L)    CO2 28  19 - 32 (mEq/L)    Glucose, Bld 102 (*) 70 - 99 (mg/dL)    BUN <3 (*) 6 - 23 (mg/dL)    Creatinine, Ser 8.41  0.50 - 1.10 (mg/dL)    Calcium 8.6  8.4 - 10.5 (mg/dL)    GFR calc non Af Amer >90  >90 (mL/min)    GFR calc Af Amer >90  >90 (mL/min)  Imaging / Studies: No results found.  Medications / Allergies: per chart  Antibiotics: Anti-infectives     Start     Dose/Rate Route Frequency Ordered Stop   06/06/11 0600   ertapenem (INVANZ) 1 g in sodium chloride 0.9 % 50 mL IVPB        1 g 100 mL/hr over 30 Minutes Intravenous Every 24 hours 06/06/11 0121 06/13/11 0559   06/05/11 2130   Ampicillin-Sulbactam (UNASYN) 3 g in sodium chloride 0.9 % 100 mL IVPB  Status:  Discontinued        3 g 100 mL/hr over 60 Minutes Intravenous Every 6 hours 06/05/11 2043 06/06/11 0130   06/05/11 2100   Ampicillin-Sulbactam (UNASYN) 3 g in sodium chloride 0.9 % 100 mL IVPB  Status:  Discontinued        3 g 100 mL/hr over 60 Minutes Intravenous  Once 06/05/11 2054 06/06/11 0130   06/05/11 2045   Ampicillin-Sulbactam (UNASYN) 3 g in sodium chloride 0.9 % 100 mL IVPB  Status:  Discontinued        3 g 100 mL/hr over 60 Minutes Intravenous On call to O.R. 06/05/11 2043 06/05/11 2051          Problem List:  Principal Problem:  *Ruptured appendicitis Active  Problems:  Hypertension  Anxiety   Assessment  [name]  61 y.o. female  5 Days Post-Op  Procedure(s): APPENDECTOMY  Ileus resolving  Plan:  -adv diet -transition to oral pain control -BP control -anxiolysis -VTE prophylaxis- SCDs, etc -mobilize as tolerated to help recovery  Ardeth Sportsman, M.D., F.A.C.S. Gastrointestinal and Minimally Invasive Surgery [facility] [address] [place], [place] [address] [phone] Main / Paging [phone] Voice Mail   06/10/2011

## 2011-06-11 MED ORDER — SODIUM CHLORIDE 0.9 % IJ SOLN: 3.0000 mL | INTRAMUSCULAR | Status: DC | PRN

## 2011-06-11 MED ORDER — HYDROMORPHONE BOLUS VIA INFUSION
0.5000 mg | INTRAVENOUS | Status: DC | PRN
  Filled 2011-06-11: qty 2

## 2011-06-11 MED ORDER — LACTATED RINGERS IV BOLUS (SEPSIS): 1000.0000 mL | Freq: Three times a day (TID) | INTRAVENOUS | Status: DC | PRN

## 2011-06-11 NOTE — Progress Notes (Signed)
Meredith Rose [id number] 1950/10/08  CARE TEAM:  PCP: Astrid Divine, MD, MD  Outpatient Care Team: Patient Care Team: Maurice Small, MD as PCP - General (Family Medicine)  Inpatient Treatment Team: Treatment Team: Attending Provider: Md Montez Morita, MD; Rounding Team: Md Montez Morita, MD; Technician: Roslynn Amble, NT; Registered Nurse: Vassie Loll, RN; Registered Nurse: Wynona Neat, RN; Technician: Milus Glazier, NT; Registered Nurse: Jacquelyne Balint, RN; Registered Nurse: Laureen Ochs, RN  Subjective: Tol PO Sore less.  Not using PCA much.  Wants to get rid of it Wants to get up but not walking much  Objective:  Vital signs:  Filed Vitals:   06/11/11 0443 06/11/11 0646 06/11/11 0800 06/11/11 1000  BP:  124/64  128/75  Pulse:  67  68  Temp:  97.6 F (36.4 C)  98 F (36.7 C)  TempSrc:  Oral  Oral  Resp: 19 18 17 16   Height:      Weight:      SpO2: 95% 96% 98% 97%    Last BM Date: 06/10/11  Intake/Output   Yesterday:  05/11 0701 - 05/12 0700 In: 1736.4 [P.O.:120; I.V.:1616.4] Out: 1625 [Urine:1500; Drains:125] This shift:     Bowel function:  Flatus: y  BM: y  Drain - serosanguinous  Physical Exam:  General: Pt more alert, oriented x4 in no acute distress Eyes: PERRL, normal EOM.  Sclera clear.  No icterus Neuro: CN II-XII intact w/o focal sensory/motor deficits. Lymph: No head/neck/groin lymphadenopathy Psych:  No delerium/psychosis/paranoia HENT: Normocephalic, Mucus membranes moist.  No thrush Neck: Supple, No tracheal deviation Chest: No chest wall pain w good excursion CV:  Pulses intact.  Regular rhythm Abdomen: Soft.  Nondistended.  Mildly tender at RLQ incision.  Wound vac clean - tolerated dressing change well.  No incarcerated hernias. Ext:  SCDs BLE.  No mjr edema.  No cyanosis Skin: No petechiae / purpurae  Results:   Labs: Results for orders placed during the hospital encounter of 06/05/11 (from the past 48  hour(s))  CBC     Status: Abnormal   Collection Time   06/10/11  7:00 AM      Component Value Range Comment   WBC 10.3  4.0 - 10.5 (K/uL)    RBC 3.73 (*) 3.87 - 5.11 (MIL/uL)    Hemoglobin 10.5 (*) 12.0 - 15.0 (g/dL)    HCT 95.6 (*) 21.3 - 46.0 (%)    MCV 85.5  78.0 - 100.0 (fL)    MCH 28.2  26.0 - 34.0 (pg)    MCHC 32.9  30.0 - 36.0 (g/dL)    RDW 08.6  57.8 - 46.9 (%)    Platelets 336  150 - 400 (K/uL)   BASIC METABOLIC PANEL     Status: Abnormal   Collection Time   06/10/11  7:00 AM      Component Value Range Comment   Sodium 142  135 - 145 (mEq/L)    Potassium 3.5  3.5 - 5.1 (mEq/L)    Chloride 104  96 - 112 (mEq/L)    CO2 28  19 - 32 (mEq/L)    Glucose, Bld 102 (*) 70 - 99 (mg/dL)    BUN <3 (*) 6 - 23 (mg/dL)    Creatinine, Ser 6.29  0.50 - 1.10 (mg/dL)    Calcium 8.6  8.4 - 10.5 (mg/dL)    GFR calc non Af Amer >90  >90 (mL/min)    GFR calc Af Amer >90  >90 (mL/min)  Imaging / Studies: No results found.  Medications / Allergies: per chart  Antibiotics: Anti-infectives     Start     Dose/Rate Route Frequency Ordered Stop   06/06/11 0600   ertapenem (INVANZ) 1 g in sodium chloride 0.9 % 50 mL IVPB        1 g 100 mL/hr over 30 Minutes Intravenous Every 24 hours 06/06/11 0121 06/13/11 0559   06/05/11 2130   Ampicillin-Sulbactam (UNASYN) 3 g in sodium chloride 0.9 % 100 mL IVPB  Status:  Discontinued        3 g 100 mL/hr over 60 Minutes Intravenous Every 6 hours 06/05/11 2043 06/06/11 0130   06/05/11 2100   Ampicillin-Sulbactam (UNASYN) 3 g in sodium chloride 0.9 % 100 mL IVPB  Status:  Discontinued        3 g 100 mL/hr over 60 Minutes Intravenous  Once 06/05/11 2054 06/06/11 0130   06/05/11 2045   Ampicillin-Sulbactam (UNASYN) 3 g in sodium chloride 0.9 % 100 mL IVPB  Status:  Discontinued        3 g 100 mL/hr over 60 Minutes Intravenous On call to O.R. 06/05/11 2043 06/05/11 2051          Problem List:  Principal Problem:  *Ruptured appendicitis Active  Problems:  Hypertension  Anxiety   Assessment  Meredith Rose  61 y.o. female  6 Days Post-Op  Procedure(s): APPENDECTOMY  Ileus resolving  Plan:  -adv diet -transition to oral pain control.  D/c PCA -BP control -anxiolysis -VTE prophylaxis- SCDs, etc -mobilize as tolerated to help recovery  Ardeth Sportsman, M.D., F.A.C.S. Gastrointestinal and Minimally Invasive Surgery [facility] [address] [place], [place] [address] [phone] Main / Paging [phone] Voice Mail   06/11/2011

## 2011-06-12 ENCOUNTER — Telehealth (INDEPENDENT_AMBULATORY_CARE_PROVIDER_SITE_OTHER): Payer: Self-pay | Admitting: General Surgery

## 2011-06-12 MED ORDER — AMOXICILLIN-POT CLAVULANATE 875-125 MG PO TABS: 1.0000 | ORAL_TABLET | Freq: Two times a day (BID) | ORAL | Status: DC

## 2011-06-12 MED ORDER — OXYCODONE HCL 5 MG PO TABS: 5.0000 mg | ORAL_TABLET | ORAL | Status: DC | PRN

## 2011-06-12 NOTE — Progress Notes (Signed)
Patient ID: Meredith Rose, female   DOB: 07/15/1950, 61 y.o.   MRN: [id number] 7 Days Post-Op  Subjective: Pt feeling ok.  Pain well controlled.  Tolerating solid diet.  Having BMs  Objective: Vital signs in last 24 hours: Temp:  [98 F (36.7 C)-98.3 F (36.8 C)] 98.2 F (36.8 C) (05/13 0602) Pulse Rate:  [60-70] 70  (05/13 0602) Resp:  [16-20] 18  (05/13 0602) BP: (119-134)/(46-75) 134/57 mmHg (05/13 0602) SpO2:  [96 %-99 %] 96 % (05/13 0602) Last BM Date: 06/11/11  Intake/Output from previous day: 05/12 0701 - 05/13 0700 In: 614 [P.O.:360; IV Piggyback:254] Out: 20 [Drains:20] Intake/Output this shift:    PE: Abd: soft, VAC in place, +BS, JP with minimal serous output. ND  Lab Results:   Basename 06/10/11 0700  WBC 10.3  HGB 10.5*  HCT 31.9*  PLT 336   BMET  Basename 06/10/11 0700  NA 142  K 3.5  CL 104  CO2 28  GLUCOSE 102*  BUN <3*  CREATININE 0.55  CALCIUM 8.6   PT/INR No results found for this basename: LABPROT:2,INR:2 in the last 72 hours CMP     Component Value Date/Time   NA 142 06/10/2011 0700   K 3.5 06/10/2011 0700   CL 104 06/10/2011 0700   CO2 28 06/10/2011 0700   GLUCOSE 102* 06/10/2011 0700   BUN <3* 06/10/2011 0700   CREATININE 0.55 06/10/2011 0700   CALCIUM 8.6 06/10/2011 0700   GFRNONAA >90 06/10/2011 0700   GFRAA >90 06/10/2011 0700   Lipase     Component Value Date/Time   LIPASE 21 06/05/2011 1516       Studies/Results: No results found.  Anti-infectives: Anti-infectives     Start     Dose/Rate Route Frequency Ordered Stop   06/06/11 0600   ertapenem (INVANZ) 1 g in sodium chloride 0.9 % 50 mL IVPB        1 g 100 mL/hr over 30 Minutes Intravenous Every 24 hours 06/06/11 0121 06/16/11 0559   06/05/11 2130   Ampicillin-Sulbactam (UNASYN) 3 g in sodium chloride 0.9 % 100 mL IVPB  Status:  Discontinued        3 g 100 mL/hr over 60 Minutes Intravenous Every 6 hours 06/05/11 2043 06/06/11 0130   06/05/11 2100    Ampicillin-Sulbactam (UNASYN) 3 g in sodium chloride 0.9 % 100 mL IVPB  Status:  Discontinued        3 g 100 mL/hr over 60 Minutes Intravenous  Once 06/05/11 2054 06/06/11 0130   06/05/11 2045   Ampicillin-Sulbactam (UNASYN) 3 g in sodium chloride 0.9 % 100 mL IVPB  Status:  Discontinued        3 g 100 mL/hr over 60 Minutes Intravenous On call to O.R. 06/05/11 2043 06/05/11 2051           Assessment/Plan  1. S/p open appy 2. Post op ileus 3. Hypoxia, resolved  Plan: 1. Ok for [employer] home today 2. Will go home with VAC, but get NS WD dressing until advance can come place VAC 3. Dc JP drain 4. Will give 5-7 more days of po abx.   LOS: 7 days    , E 06/12/2011

## 2011-06-12 NOTE — Discharge Summary (Signed)
Doing well.  Can go home.  Marta Lamas. Gae Bon, MD, FACS [phone] [facility]

## 2011-06-12 NOTE — Discharge Instructions (Signed)
Over the counter Miralax as needed for constipation  [facility]      [facility], [place] [phone]  OPEN ABDOMINAL SURGERY: POST OP INSTRUCTIONS  Always review your discharge instruction sheet given to you by the facility where your surgery was performed.  IF YOU HAVE DISABILITY OR FAMILY LEAVE FORMS, YOU MUST BRING THEM TO THE OFFICE FOR PROCESSING.  PLEASE DO NOT GIVE THEM TO YOUR DOCTOR.  1. A prescription for pain medication may be given to you upon discharge.  Take your pain medication as prescribed, if needed.  If narcotic pain medicine is not needed, then you may take acetaminophen (Tylenol) or ibuprofen (Advil) as needed. 2. Take your usually prescribed medications unless otherwise directed. 3. If you need a refill on your pain medication, please contact your pharmacy. They will contact our office to request authorization.  Prescriptions will not be filled after 5pm or on week-ends. 4. You should follow a light diet the first few days after arrival home, such as soup and crackers, pudding, etc.unless your doctor has advised otherwise. A high-fiber, low fat diet can be resumed as tolerated.   Be sure to include lots of fluids daily. Most patients will experience some swelling and bruising on the chest and neck area.  Ice packs will help.  Swelling and bruising can take several days to resolve 5. Most patients will experience some swelling and bruising in the area of the incision. Ice pack will help. Swelling and bruising can take several days to resolve..  6. It is common to experience some constipation if taking pain medication after surgery.  Increasing fluid intake and taking a stool softener will usually help or prevent this problem from occurring.  A mild laxative (Milk of Magnesia or Miralax) should be taken according to package directions if there are no bowel movements after 48 hours. 7.  You may have steri-strips (small skin tapes) in place directly over the incision.  These  strips should be left on the skin for 7-10 days.  If your surgeon used skin glue on the incision, you may shower in 24 hours.  The glue will flake off over the next 2-3 weeks.  Any sutures or staples will be removed at the office during your follow-up visit. You may find that a light gauze bandage over your incision may keep your staples from being rubbed or pulled. You may shower and replace the bandage daily. 8. ACTIVITIES:  You may resume regular (light) daily activities beginning the next day--such as daily self-care, walking, climbing stairs--gradually increasing activities as tolerated.  You may have sexual intercourse when it is comfortable.  Refrain from any heavy lifting or straining until approved by your doctor. a. You may drive when you no longer are taking prescription pain medication, you can comfortably wear a seatbelt, and you can safely maneuver your car and apply brakes b. Return to Work: ___________________________________ 9. You should see your doctor in the office for a follow-up appointment approximately two weeks after your surgery.  Make sure that you call for this appointment within a day or two after you arrive home to insure a convenient appointment time. OTHER INSTRUCTIONS:  _____________________________________________________________ _____________________________________________________________  WHEN TO CALL YOUR DOCTOR: 1. Fever over 101.0 2. Inability to urinate 3. Nausea and/or vomiting 4. Extreme swelling or bruising 5. Continued bleeding from incision. 6. Increased pain, redness, or drainage from the incision. 7. Difficulty swallowing or breathing 8. Muscle cramping or spasms. 9. Numbness or tingling in hands or feet or  around lips.  The clinic staff is available to answer your questions during regular business hours.  Please don't hesitate to call and ask to speak to one of the nurses if you have concerns.  For further questions, please visit  [email or web address]

## 2011-06-12 NOTE — Progress Notes (Signed)
WBC okay, having bowel movements.  Okay to go home.  Drain discontinued.  Marta Lamas. Gae Bon, MD, FACS [phone] [facility]

## 2011-06-12 NOTE — Discharge Summary (Signed)
Patient ID: Meredith Rose MRN: [id number] DOB/AGE: [birth year] 61 y.o.  Admit date: 06/05/2011 Discharge date: 06/12/2011  Procedures: laparoscopic converted to open apendectomy  Consults: None  Reason for Admission: This is a 61 yo female who presented to the [facility] with RLQ abdominal pain.  She was found to have appendicitis that was likely ruptured.  She was then admitted.  Please see admitting H&P for further details.  Admission Diagnoses:  1. Acute perforated appendicitis 2. HTN 3. Hypothyroidism  Hospital Course: The patient was admitted and taken to the operating room where she underwent a lap converted to open appendectomy.  She had a perforated appendix with some contamination.  Her skin was left open.  Postoperatively, the patient had some hypoxia of unknown cause.  She was transferred to a SDU for closer monitoring.  She improved rather quickly from this and was able to be weaned off of O2 within a couple of days.  She was transferred back up to a floor.  She had a wound VAC placed on her open incision.  She had an NGT in for a couple of days, but due to minimal output, this was discontinued on POD# 2.  She ultimately began having flatus and her diet was able to be advanced.  On POD# 7, the patient was surgically stable for discharge home.  She was on IV Invanz for her full length of stay.  Discharge Diagnoses:  Principal Problem:  *Ruptured appendicitis Active Problems:  Hypertension  Anxiety s/p open appy Hypoxia, resolved  Discharge Medications: Medication List  As of 06/12/2011  9:10 AM   STOP taking these medications               HYDROcodone-acetaminophen 5-325 MG per tablet         TAKE these medications         amoxicillin-clavulanate 875-125 MG per tablet   Commonly known as: AUGMENTIN   Take 1 tablet by mouth 2 (two) times daily.      celecoxib 200 MG capsule   Commonly known as: CELEBREX   Take 200 mg by mouth daily.      levothyroxine 112 MCG  tablet   Commonly known as: SYNTHROID, LEVOTHROID   Take 112 mcg by mouth daily.      losartan-hydrochlorothiazide 100-25 MG per tablet   Commonly known as: HYZAAR   Take 1 tablet by mouth daily.      mulitivitamin with minerals Tabs   Take 1 tablet by mouth daily.      oxyCODONE 5 MG immediate release tablet   Commonly known as: Oxy IR/ROXICODONE   Take 1-2 tablets (5-10 mg total) by mouth every 4 (four) hours as needed.      propranolol 10 MG tablet   Commonly known as: INDERAL   Take 10 mg by mouth daily.      Vitamin D 2000 UNITS tablet   Take 2,000 Units by mouth daily.      ZOLOFT PO   Take 1 tablet by mouth daily.            acetaminophen 500 MG tablet        Discharge Instructions: Follow-up Information    Follow up with WYATT Rene Kocher, MD. Schedule an appointment as soon as possible for a visit in 2 weeks.   Contact information:   [employer] [facility] [address] [place] [address] [phone]          Signed: Letha Cape  06/12/2011, 9:10 AM

## 2011-06-12 NOTE — Progress Notes (Signed)
Discharge home. Home discharge instruction given, no questions verbalized. Incision CDI. Staples out er order by MD.

## 2011-06-20 ENCOUNTER — Ambulatory Visit (INDEPENDENT_AMBULATORY_CARE_PROVIDER_SITE_OTHER): Payer: BC Managed Care – PPO | Admitting: General Surgery

## 2011-06-20 ENCOUNTER — Encounter (HOSPITAL_COMMUNITY): Payer: Self-pay

## 2011-06-20 ENCOUNTER — Encounter (INDEPENDENT_AMBULATORY_CARE_PROVIDER_SITE_OTHER): Payer: Self-pay | Admitting: General Surgery

## 2011-06-20 ENCOUNTER — Inpatient Hospital Stay (HOSPITAL_COMMUNITY): Payer: BC Managed Care – PPO

## 2011-06-20 ENCOUNTER — Inpatient Hospital Stay (HOSPITAL_COMMUNITY)
Admission: RE | Admit: 2011-06-20 | Discharge: 2011-06-29 | DRG: 552 | Disposition: A | Discharge status: Home / Self Care | Payer: BC Managed Care – PPO | Source: Ambulatory Visit | Attending: General Surgery | Admitting: General Surgery

## 2011-06-20 VITALS — BP 132/68 | HR 72 | Temp 97.4°F | Resp 16 | Ht 66.0 in | Wt 209.2 lb

## 2011-06-20 VITALS — BP 103/57 | HR 69 | Temp 98.0°F | Resp 18 | Ht 66.0 in | Wt 208.3 lb

## 2011-06-20 DIAGNOSIS — K56609 Unspecified intestinal obstruction, unspecified as to partial versus complete obstruction: Secondary | ICD-10-CM

## 2011-06-20 DIAGNOSIS — K560 Paralytic ileus: Secondary | ICD-10-CM | POA: Diagnosis present

## 2011-06-20 DIAGNOSIS — E669 Obesity, unspecified: Secondary | ICD-10-CM | POA: Diagnosis present

## 2011-06-20 DIAGNOSIS — M129 Arthropathy, unspecified: Secondary | ICD-10-CM | POA: Diagnosis present

## 2011-06-20 DIAGNOSIS — Z09 Encounter for follow-up examination after completed treatment for conditions other than malignant neoplasm: Secondary | ICD-10-CM

## 2011-06-20 DIAGNOSIS — Z6833 Body mass index (BMI) 33.0-33.9, adult: Secondary | ICD-10-CM

## 2011-06-20 DIAGNOSIS — Z79899 Other long term (current) drug therapy: Secondary | ICD-10-CM

## 2011-06-20 DIAGNOSIS — E46 Unspecified protein-calorie malnutrition: Secondary | ICD-10-CM | POA: Diagnosis not present

## 2011-06-20 DIAGNOSIS — F411 Generalized anxiety disorder: Secondary | ICD-10-CM | POA: Diagnosis present

## 2011-06-20 DIAGNOSIS — Y836 Removal of other organ (partial) (total) as the cause of abnormal reaction of the patient, or of later complication, without mention of misadventure at the time of the procedure: Secondary | ICD-10-CM | POA: Diagnosis present

## 2011-06-20 DIAGNOSIS — K567 Ileus, unspecified: Secondary | ICD-10-CM | POA: Diagnosis present

## 2011-06-20 DIAGNOSIS — K3532 Acute appendicitis with perforation and localized peritonitis, without abscess: Secondary | ICD-10-CM | POA: Diagnosis present

## 2011-06-20 DIAGNOSIS — K929 Disease of digestive system, unspecified: Principal | ICD-10-CM | POA: Diagnosis present

## 2011-06-20 DIAGNOSIS — I10 Essential (primary) hypertension: Secondary | ICD-10-CM | POA: Diagnosis present

## 2011-06-20 DIAGNOSIS — E039 Hypothyroidism, unspecified: Secondary | ICD-10-CM | POA: Diagnosis present

## 2011-06-20 HISTORY — DX: Reserved for concepts with insufficient information to code with codable children: IMO0002

## 2011-06-20 LAB — BASIC METABOLIC PANEL
BUN: 13 mg/dL (ref 6–23)
CO2: 29 mEq/L (ref 19–32)
Chloride: 95 mEq/L — ABNORMAL LOW (ref 96–112)
Creatinine, Ser: 1.03 mg/dL (ref 0.50–1.10)
Glucose, Bld: 118 mg/dL — ABNORMAL HIGH (ref 70–99)

## 2011-06-20 LAB — DIFFERENTIAL
Eosinophils Absolute: 0.2 10*3/uL (ref 0.0–0.7)
Eosinophils Relative: 2 % (ref 0–5)
Lymphs Abs: 2 10*3/uL (ref 0.7–4.0)
Monocytes Absolute: 1 10*3/uL (ref 0.1–1.0)
Monocytes Relative: 8 % (ref 3–12)

## 2011-06-20 LAB — CBC
HCT: 36.5 % (ref 36.0–46.0)
MCH: 27.6 pg (ref 26.0–34.0)
MCV: 84.7 fL (ref 78.0–100.0)
RBC: 4.31 MIL/uL (ref 3.87–5.11)
WBC: 13.4 10*3/uL — ABNORMAL HIGH (ref 4.0–10.5)

## 2011-06-20 MED ORDER — LOSARTAN POTASSIUM 50 MG PO TABS
100.0000 mg | ORAL_TABLET | Freq: Every day | ORAL | Status: DC
  Administered 2011-06-21 – 2011-06-29 (×9): 100 mg via ORAL
  Filled 2011-06-20 (×10): qty 2

## 2011-06-20 MED ORDER — DIPHENHYDRAMINE HCL 50 MG/ML IJ SOLN: 12.5000 mg | Freq: Four times a day (QID) | INTRAMUSCULAR | Status: DC | PRN

## 2011-06-20 MED ORDER — KCL IN DEXTROSE-NACL 20-5-0.45 MEQ/L-%-% IV SOLN
INTRAVENOUS | Status: AC
  Administered 2011-06-20 – 2011-06-23 (×8): via INTRAVENOUS
  Administered 2011-06-23: 125 mL/h via INTRAVENOUS
  Administered 2011-06-24 (×2): via INTRAVENOUS
  Filled 2011-06-20 (×16): qty 1000

## 2011-06-20 MED ORDER — LEVOTHYROXINE SODIUM 112 MCG PO TABS
112.0000 ug | ORAL_TABLET | Freq: Every day | ORAL | Status: DC
  Administered 2011-06-21 – 2011-06-29 (×9): 112 ug via ORAL
  Filled 2011-06-20 (×11): qty 1

## 2011-06-20 MED ORDER — ACETAMINOPHEN 650 MG RE SUPP: 650.0000 mg | Freq: Four times a day (QID) | RECTAL | Status: DC | PRN

## 2011-06-20 MED ORDER — DIPHENHYDRAMINE HCL 12.5 MG/5ML PO ELIX
12.5000 mg | ORAL_SOLUTION | Freq: Four times a day (QID) | ORAL | Status: DC | PRN
  Filled 2011-06-20: qty 10

## 2011-06-20 MED ORDER — PROMETHAZINE HCL 12.5 MG PO TABS: 12.5000 mg | ORAL_TABLET | Freq: Four times a day (QID) | ORAL | Status: DC | PRN

## 2011-06-20 MED ORDER — ACETAMINOPHEN 325 MG PO TABS: 650.0000 mg | ORAL_TABLET | Freq: Four times a day (QID) | ORAL | Status: DC | PRN

## 2011-06-20 MED ORDER — HYDROCHLOROTHIAZIDE 25 MG PO TABS
25.0000 mg | ORAL_TABLET | Freq: Every day | ORAL | Status: DC
  Administered 2011-06-21 – 2011-06-29 (×9): 25 mg via ORAL
  Filled 2011-06-20 (×10): qty 1

## 2011-06-20 MED ORDER — PANTOPRAZOLE SODIUM 40 MG IV SOLR
40.0000 mg | Freq: Every day | INTRAVENOUS | Status: DC
  Administered 2011-06-20 – 2011-06-27 (×8): 40 mg via INTRAVENOUS
  Filled 2011-06-20 (×10): qty 40

## 2011-06-20 MED ORDER — ONDANSETRON HCL 4 MG/2ML IJ SOLN
4.0000 mg | Freq: Four times a day (QID) | INTRAMUSCULAR | Status: DC | PRN
  Administered 2011-06-20 – 2011-06-21 (×3): 4 mg via INTRAVENOUS
  Filled 2011-06-20 (×3): qty 2

## 2011-06-20 MED ORDER — CHLORHEXIDINE GLUCONATE 0.12 % MT SOLN
15.0000 mL | Freq: Two times a day (BID) | OROMUCOSAL | Status: DC
  Administered 2011-06-20 – 2011-06-27 (×12): 15 mL via OROMUCOSAL
  Filled 2011-06-20 (×13): qty 15

## 2011-06-20 MED ORDER — SERTRALINE HCL 100 MG PO TABS
100.0000 mg | ORAL_TABLET | Freq: Every day | ORAL | Status: DC
  Administered 2011-06-20 – 2011-06-29 (×10): 100 mg via ORAL
  Filled 2011-06-20 (×10): qty 1

## 2011-06-20 MED ORDER — MORPHINE SULFATE 2 MG/ML IJ SOLN
1.0000 mg | INTRAMUSCULAR | Status: DC | PRN
  Filled 2011-06-20 (×2): qty 1

## 2011-06-20 MED ORDER — LOSARTAN POTASSIUM-HCTZ 100-25 MG PO TABS: 1.0000 | ORAL_TABLET | Freq: Every day | ORAL | Status: DC

## 2011-06-20 MED ORDER — ENOXAPARIN SODIUM 40 MG/0.4ML ~~LOC~~ SOLN
40.0000 mg | SUBCUTANEOUS | Status: DC
  Administered 2011-06-20 – 2011-06-28 (×9): 40 mg via SUBCUTANEOUS
  Filled 2011-06-20 (×10): qty 0.4

## 2011-06-20 MED ORDER — BIOTENE DRY MOUTH MT LIQD
15.0000 mL | Freq: Two times a day (BID) | OROMUCOSAL | Status: DC
  Administered 2011-06-21 – 2011-06-27 (×11): 15 mL via OROMUCOSAL

## 2011-06-20 MED ORDER — PROPRANOLOL HCL 10 MG PO TABS
10.0000 mg | ORAL_TABLET | Freq: Every day | ORAL | Status: DC
  Administered 2011-06-21 – 2011-06-29 (×9): 10 mg via ORAL
  Filled 2011-06-20 (×10): qty 1

## 2011-06-20 NOTE — Progress Notes (Signed)
HPI The patient comes in complaining of nausea and reflux. This started this morning at approximately 1:00 AM and had a recent episode about 10. She did have a normal bowel movement this morning.  PE On examination the negative wound pressure dressing was removed and the wound examined directly. It has healed very well and only has a very thin area with excellent granulation tissue remaining. This can be treated with hydrogel treatments once a day.  Her abdominal examination shows some mild distention but not very tight. She is nontender and has hypoactive bowel sounds.  Studiy review Pericardial and no studies to review however accommodating a 3 way abdominal series and possibly have the patient admitted.  Assessment Having some difficulty status post open appendectomy for a ruptured appendix in a patient with a previous history of ovarian cancer.  She's having difficulty currently getting x-rays to see if she has a partial small bowel obstruction.  Plan Syndrome patient took on Hospital for x-rays and contact the surgical hospitalist PA to have her look for the x-rays and decide if they need the patient to be admitted.  The x-rays do not warrant readmission and the patient improved I will give her a prescription for some Phenergan and have her come back to see me in 3-4 weeks.

## 2011-06-20 NOTE — H&P (Signed)
Meredith Rose [birth year]  [id number].  Chief Complaint/Reason for Consult: ileus HPI: This is a 61 yo female who just recently underwent an open appendectomy by Dr. Jimmye Norman for a perforated appendicitis.  She has been doing ok since she got home with the exception of a poor appetite.  She had her follow up appointment today with Dr. Lindie Spruce at which point she told him that she started throwing up bilious emesis last night.  She has had 2 episodes.  She c/o overall not feeling well.  She was sent to [facility] radiology where she had an x-ray that revealed an early PSBO vs focal ileus.  We will plan on direct admission for further management.  Please see HPI, otherwise all other systems are negative.  No family history on file.  Past Medical History  Diagnosis Date  . Hypertension   . Anxiety   . Ruptured appendicitis 06/03/11  . Ovarian cancer 2002  . Hypothyroidism   . Arthritis     Past Surgical History  Procedure Date  . Knee arthroscopy ~ 2007    left  . Knee arthroscopy 12/2010    right  . Abdominal hysterectomy 2002    "radical; for ovarian cancer"  . Abdominal wound dehiscence 2002-2003    "twice"  . Appendectomy 06/05/2011    Procedure: APPENDECTOMY;  Surgeon: Cherylynn Ridges, MD;  Location: [facility] OR;  Service: General;;    Social History:  reports that she has never smoked. She has never used smokeless tobacco. She reports that she drinks alcohol. She reports that she does not use illicit drugs.  Allergies: No Known Allergies  Medications Prior to Admission  Medication Sig Dispense Refill  . celecoxib (CELEBREX) 200 MG capsule Take 200 mg by mouth daily.        . Cholecalciferol (VITAMIN D) 2000 UNITS tablet Take 2,000 Units by mouth daily.        Marland Kitchen levothyroxine (SYNTHROID, LEVOTHROID) 112 MCG tablet Take 112 mcg by mouth daily.        Marland Kitchen losartan-hydrochlorothiazide (HYZAAR) 100-25 MG per tablet Take 1 tablet by mouth daily.        . Multiple Vitamin (MULITIVITAMIN WITH  MINERALS) TABS Take 1 tablet by mouth daily.        Marland Kitchen oxyCODONE (OXY IR/ROXICODONE) 5 MG immediate release tablet Take 1-2 tablets (5-10 mg total) by mouth every 4 (four) hours as needed.  40 tablet  0  . promethazine (PHENERGAN) 12.5 MG tablet Take 1 tablet (12.5 mg total) by mouth every 6 (six) hours as needed for nausea.  30 tablet  0  . propranolol (INDERAL) 10 MG tablet Take 10 mg by mouth daily.        . Sertraline HCl (ZOLOFT PO) Take 1 tablet by mouth daily.         There were no vitals taken for this visit. Physical Exam: General: pleasant, obese white female who is sitting up in her wheelchair and appears to not feel well. HEENT: head is normocephalic, atraumatic.  Sclera are noninjected.  PERRL.  Ears and nose without any masses or lesions.  Mouth is pink and moist Heart: regular, rate, and rhythm.  Normal s1,s2. No obvious murmurs, gallops, or rubs noted.  Palpable radial and pedal pulses bilaterally Lungs: CTAB, no wheezes, rhonchi, or rales noted.  Respiratory effort nonlabored Abd: soft, NT, minimal distention, hypoactive BS, no masses, hernias, or organomegaly, wound present in RLQ that is healing well.  Hydrogel dressing in place MS: all  4 extremities are symmetrical with no cyanosis, clubbing, or edema. Skin: warm and dry with no masses, lesions, or rashes Psych: A&Ox3 with an appropriate affect.    No results found for this or any previous visit (from the past 48 hour(s)). Dg Abd Acute W/chest  06/20/2011  *RADIOLOGY REPORT*  Clinical Data: Nausea and vomiting for 2 days.  Recent appendectomy.  ACUTE ABDOMEN SERIES (ABDOMEN 2 VIEW & CHEST 1 VIEW)  Comparison: Portable chest 06/06/2011.  Abdominal pelvic CT 06/05/2011.  Findings: There is improved aeration of the lung bases.  Bibasilar atelectasis has resolved.  The nasogastric tube has been removed. The heart size and mediastinal contours are stable.  Abdominal radiographs demonstrate moderate diffuse small bowel distension  with multiple air-fluid levels on the erect examination. The colon is relatively decompressed.  There is no free intraperitoneal air.  Retroperitoneal surgical clips and calcified gallstones are noted.  IMPRESSION:  1.  The bowel gas pattern is suspicious for an early or partial small-bowel obstruction.  In the setting of recent appendectomy, findings could reflect a focal ileus. 2.  Improved basilar aeration.  Original Report Authenticated By: Gerrianne Scale, M.D.       Assessment/Plan 1. Early PSBO vs ileus 2. S/p open appendectomy for perforated appendicitis 3. Hypothyroidism 4. HTN  Plan: 1. The patient's abdominal x-rays reveal a PSBO vs ileus.  This is likely secondary to an ileus.  For now we will symptomatically treat the patient with IVFs and antiemetics.  I will hold off on an NGT for now, but have informed the patient if she has any further emesis that she may require this.  We will recheck abdominal films in the morning.  I have discussed all of this with the patient and her husband.  They agree.  , E 06/20/2011, 2:40 PM

## 2011-06-21 ENCOUNTER — Inpatient Hospital Stay (HOSPITAL_COMMUNITY): Payer: BC Managed Care – PPO

## 2011-06-21 LAB — BASIC METABOLIC PANEL
BUN: 12 mg/dL (ref 6–23)
Calcium: 9.4 mg/dL (ref 8.4–10.5)
Creatinine, Ser: 1.1 mg/dL (ref 0.50–1.10)
GFR calc non Af Amer: 53 mL/min — ABNORMAL LOW (ref >90)
Glucose, Bld: 141 mg/dL — ABNORMAL HIGH (ref 70–99)
Potassium: 4.2 mEq/L (ref 3.5–5.1)

## 2011-06-21 LAB — CBC
HCT: 35.9 % — ABNORMAL LOW (ref 36.0–46.0)
MCH: 27.6 pg (ref 26.0–34.0)
MCHC: 32 g/dL (ref 30.0–36.0)
MCV: 86.3 fL (ref 78.0–100.0)
Platelets: 514 10*3/uL — ABNORMAL HIGH (ref 150–400)
RDW: 14.7 % (ref 11.5–15.5)

## 2011-06-21 MED ORDER — IOHEXOL 300 MG/ML  SOLN
20.0000 mL | INTRAMUSCULAR | Status: AC
  Administered 2011-06-21: 20 mL via ORAL

## 2011-06-21 MED ORDER — IOHEXOL 300 MG/ML  SOLN
100.0000 mL | Freq: Once | INTRAMUSCULAR | Status: AC | PRN
  Administered 2011-06-21: 100 mL via INTRAVENOUS

## 2011-06-21 NOTE — Progress Notes (Signed)
INITIAL ADULT NUTRITION ASSESSMENT Date: 06/21/2011   Time: 1:10 PM Reason for Assessment: nutrition risk  ASSESSMENT: Female 61 y.o.  Dx: Ileus  Hx:  Past Medical History  Diagnosis Date  . Hypertension   . Anxiety   . Ruptured appendicitis 06/03/11  . Hypothyroidism   . Arthritis   . Ovarian cancer 2002  . 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 Knee arthroscopy ~ 2007    left  . Knee arthroscopy 12/2010    right  . Abdominal hysterectomy 2002    "radical; for ovarian cancer"  . Abdominal wound dehiscence 2002-2003    "twice"  . Appendectomy 06/05/2011    Procedure: APPENDECTOMY;  Surgeon: Cherylynn Ridges, MD;  Location: [facility] OR;  Service: General;;  . Squamous cell carcinoma excision 10/2009    right shin    Related Meds:  Scheduled Meds:   . antiseptic oral rinse  15 mL Mouth Rinse q12n4p  . chlorhexidine  15 mL Mouth Rinse BID  . enoxaparin  40 mg Subcutaneous Q24H  . losartan  100 mg Oral Daily   And  . hydrochlorothiazide  25 mg Oral Daily  . iohexol  20 mL Oral Q1 Hr x 2  . levothyroxine  112 mcg Oral QAC breakfast  . pantoprazole (PROTONIX) IV  40 mg Intravenous QHS  . propranolol  10 mg Oral Daily  . sertraline  100 mg Oral Daily  . DISCONTD: losartan-hydrochlorothiazide  1 tablet Oral Daily   Continuous Infusions:   . dextrose 5 % and 0.45 % NaCl with KCl 20 mEq/L 125 mL/hr at 06/21/11 1024   PRN Meds:.acetaminophen, acetaminophen, diphenhydrAMINE, diphenhydrAMINE, morphine injection, ondansetron   Ht: 5\' 6"  (167.6 cm)  Wt: 208 lb 5.4 oz (94.5 kg)  Ideal Wt: 59.1 kg % Ideal Wt: 159%  Usual Wt: 227 per pt % Usual Wt: 91%  Body mass index is 33.63 kg/(m^2).  Food/Nutrition Related Hx:  Recent appendectomy, was able to advance to bland diet before change in status Sunday evening  Labs:  CMP     Component Value Date/Time   NA 140 06/21/2011 0634   K 4.2 06/21/2011 0634   CL 100 06/21/2011 0634    CO2 28 06/21/2011 0634   GLUCOSE 141* 06/21/2011 0634   BUN 12 06/21/2011 0634   CREATININE 1.10 06/21/2011 0634   CALCIUM 9.4 06/21/2011 0634   GFRNONAA 53* 06/21/2011 0634   GFRAA 61* 06/21/2011 0634   Intake: NPO Output:   Intake/Output Summary (Last 24 hours) at 06/21/11 1312 Last data filed at 06/21/11 0600  Gross per 24 hour  Intake 1635.42 ml  Output      1 ml  Net 1634.42 ml   No stools since admission 1 episode of emesis  Diet Order: NPO  Supplements/Tube Feeding:  None at this time  IVF:    dextrose 5 % and 0.45 % NaCl with KCl 20 mEq/L Last Rate: 125 mL/hr at 06/21/11 1024    Estimated Nutritional Needs:   Kcal: 1890-2170 Protein: 70-82g Fluid: ~1.8 L/day  Pt states she was recently discharged after surgery. At that time she was slowly improving intake, had been able to advance from liquids to bland, solid food, but never fully returned to usual appetite and intake.  She states that Sunday she was feeling poorly, then started vomiting Monday night.  Pt with poor intake x3 days PTA. Pt with wt loss.  States she recently  weighed 227 lbs and is now down to 208 lbs.  Pt weighed 212 lbs in January 2013.  Overall wt trend is difficult to determine, however given suspected wt loss >2% of usual wt and poor intake of <75% of need for >1 week, pt qualifies for moderate malnutrition of acute illness.  NUTRITION DIAGNOSIS: -Inadequate oral intake (NI-2.1).  Status: Ongoing  RELATED TO: nausea, vomiting  AS EVIDENCE BY: pt with possible ileus, emesis, NPO  MONITORING/EVALUATION(Goals): 1.  Food/Beverage; diet advancement with tolerance once medically appropriate 2.  Wt/wt change; monitor trends  EDUCATION NEEDS: -Education needs addressed  INTERVENTION: 1.  Modify diet; once medically appropriate. Pt attempting NGT placement for distention and management of emesis.  Monitor for readiness. 2.  Nutrition support; if not able to advance diet within 72 hrs, consider  initiation of nutrition support.  Dietitian #: [phone]  DOCUMENTATION CODES Per approved criteria  -Non-severe (moderate) malnutrition in the context of acute illness or injury    Hoyt Koch 06/21/2011, 1:10 PM

## 2011-06-21 NOTE — Progress Notes (Signed)
Pt vomited green liquid in trash can; approx.  200 cc's,but unable to measure.   Patient did not want NG tube at this time.  Medicated her with 4mg  Zofran IV at 0413 and told pt that if vomiting continued we would have to place NG tube.  She said she understood.

## 2011-06-21 NOTE — Progress Notes (Signed)
Patient ID: Meredith Rose, female   DOB: [birth year], 61 y.o.   MRN: [id number]    Subjective: Pt not feeling any better today.  Had one episode of emesis over night, but refused her NGT  Objective: Vital signs in last 24 hours: Temp:  [97.4 F (36.3 C)-98.6 F (37 C)] 98.2 F (36.8 C) (05/22 0540) Pulse Rate:  [67-77] 69  (05/22 0540) Resp:  [14-18] 16  (05/22 0540) BP: (101-132)/(39-68) 124/58 mmHg (05/22 0540) SpO2:  [94 %-98 %] 94 % (05/22 0540) Weight:  [208 lb 5.4 oz (94.5 kg)-209 lb 4 oz (94.915 kg)] 208 lb 5.4 oz (94.5 kg) 2022-06-23 1442) Last BM Date: 06/23/11  Intake/Output from previous day: Jun 23, 2022 0701 - 05/22 0700 In: 1635.4 [I.V.:1635.4] Out: 1 [Emesis/NG output:1] Intake/Output this shift:    PE: Abd: soft, NT, obese, absent BS, wound is clean and dressed. Heart: regular Lungs: CTAB  Lab Results:   Basename 06/21/11 0634 2011/06/23 1522  WBC 11.0* 13.4*  HGB 11.5* 11.9*  HCT 35.9* 36.5  PLT 514* 523*   BMET  Basename 06/21/11 0634 2011-06-23 1522  NA 140 139  K 4.2 3.5  CL 100 95*  CO2 28 29  GLUCOSE 141* 118*  BUN 12 13  CREATININE 1.10 1.03  CALCIUM 9.4 10.2   PT/INR No results found for this basename: LABPROT:2,INR:2 in the last 72 hours CMP     Component Value Date/Time   NA 140 06/21/2011 0634   K 4.2 06/21/2011 0634   CL 100 06/21/2011 0634   CO2 28 06/21/2011 0634   GLUCOSE 141* 06/21/2011 0634   BUN 12 06/21/2011 0634   CREATININE 1.10 06/21/2011 0634   CALCIUM 9.4 06/21/2011 0634   GFRNONAA 53* 06/21/2011 0634   GFRAA 61* 06/21/2011 0634   Lipase     Component Value Date/Time   LIPASE 21 06/05/2011 1516       Studies/Results: Dg Abd 2 Views  06/23/11  *RADIOLOGY REPORT*  Clinical Data: History of nausea and vomiting.  Small-bowel dilatation.  Follow-up. History of recent appendectomy.  ABDOMEN - 2 VIEW  Comparison: 2011-06-23.  Findings: There is continued dilatation of multiple loops of small intestine with multiple air-fluid levels  on the upright examination.  The pattern of air fluid levels favors partial small- bowel mechanical or functional obstruction. The degree of dilatation appears similar to prior study.  No pneumoperitoneum is seen. No abnormal intra-abdominal calcification is evident. Surgical clips are seen in the lower abdomen and upper pelvis. There is degenerative spondylosis.  IMPRESSION: Dilatation of multiple loops of small intestine with multiple air- fluid levels with paucity colon gas.  Air fluid level pattern favors partial mechanical small bowel  obstruction. The degree of small-bowel dilatation appears similar to prior study.  History is given of recent appendectomy.  I cannot exclude small focal ileus causing partial  small-bowel obstruction.  Original Report Authenticated By: Crawford Givens, M.D.   Dg Abd Acute W/chest  Jun 23, 2011  *RADIOLOGY REPORT*  Clinical Data: Nausea and vomiting for 2 days.  Recent appendectomy.  ACUTE ABDOMEN SERIES (ABDOMEN 2 VIEW & CHEST 1 VIEW)  Comparison: Portable chest 06/06/2011.  Abdominal pelvic CT 06/05/2011.  Findings: There is improved aeration of the lung bases.  Bibasilar atelectasis has resolved.  The nasogastric tube has been removed. The heart size and mediastinal contours are stable.  Abdominal radiographs demonstrate moderate diffuse small bowel distension with multiple air-fluid levels on the erect examination. The colon is relatively decompressed.  There is no  free intraperitoneal air.  Retroperitoneal surgical clips and calcified gallstones are noted.  IMPRESSION:  1.  The bowel gas pattern is suspicious for an early or partial small-bowel obstruction.  In the setting of recent appendectomy, findings could reflect a focal ileus. 2.  Improved basilar aeration.  Original Report Authenticated By: Gerrianne Scale, M.D.    Anti-infectives: Anti-infectives    None       Assessment/Plan  1. S/p open appy with ileus vs PSBO  Plan: 1. Will get CT scan today to rule  out some type of complication from her open appy. 2. Still may need NGT if continues to throw up.   LOS: 1 day    , E 06/21/2011

## 2011-06-21 NOTE — Progress Notes (Signed)
NGT tried to insert by staff but unable to insert NGT. PA made aware, patient encourage to take contrast.

## 2011-06-21 NOTE — Progress Notes (Signed)
MD on call made aware that CT Scan result is available at [facility].

## 2011-06-21 NOTE — Progress Notes (Signed)
MD on call called and  stated patient needed an NGT, endorsed to incoming nurse. Patient updated about the new order.

## 2011-06-21 NOTE — Progress Notes (Signed)
Reviewed the CT and report .  The small amount of air around the umbilicus could still be intraluminal.  She has minimal free fluid.  She has a partial bowel  Obstruction and should benefit from NGT  Marta Lamas. Gae Bon, MD, FACS [phone] [facility]

## 2011-06-22 LAB — CBC
MCH: 27.5 pg (ref 26.0–34.0)
MCHC: 31.9 g/dL (ref 30.0–36.0)
RDW: 14.6 % (ref 11.5–15.5)

## 2011-06-22 MED ORDER — PHENOL 1.4 % MT LIQD
1.0000 | OROMUCOSAL | Status: DC | PRN
  Filled 2011-06-22: qty 177

## 2011-06-22 NOTE — Progress Notes (Signed)
Patient ID: Meredith Rose, female   DOB: August 25, 1950, 61 y.o.   MRN: [id number] [facility] Progress Note:   * No surgery found *  Subjective: Mental status is clear Objective: Vital signs in last 24 hours: Temp:  [98 F (36.7 C)-98.7 F (37.1 C)] 98 F (36.7 C) (05/23 0646) Pulse Rate:  [60-73] 60  (05/23 0646) Resp:  [16-18] 16  (05/23 0646) BP: (102-117)/(43-60) 102/60 mmHg (05/23 0646) SpO2:  [96 %-100 %] 100 % (05/23 0646)  Intake/Output from previous day: 05/22 0701 - 05/23 0700 In: 2325 [I.V.:1125; NG/GT:1200] Out: 2000 [Urine:150; Emesis/NG output:1850] Intake/Output this shift:    Physical Exam: Work of breathing is  Normal.  No flatus yet.  CT reviewed and not convinced about the ? Small bowel injury near umbilicus.  Abdomen not tender  Lab Results:  Results for orders placed during the hospital encounter of 06/20/11 (from the past 48 hour(s))  CBC     Status: Abnormal   Collection Time   06/20/11  3:22 PM      Component Value Range Comment   WBC 13.4 (*) 4.0 - 10.5 (K/uL)    RBC 4.31  3.87 - 5.11 (MIL/uL)    Hemoglobin 11.9 (*) 12.0 - 15.0 (g/dL)    HCT 78.2  95.6 - 21.3 (%)    MCV 84.7  78.0 - 100.0 (fL)    MCH 27.6  26.0 - 34.0 (pg)    MCHC 32.6  30.0 - 36.0 (g/dL)    RDW 08.6  57.8 - 46.9 (%)    Platelets 523 (*) 150 - 400 (K/uL)   BASIC METABOLIC PANEL     Status: Abnormal   Collection Time   06/20/11  3:22 PM      Component Value Range Comment   Sodium 139  135 - 145 (mEq/L)    Potassium 3.5  3.5 - 5.1 (mEq/L)    Chloride 95 (*) 96 - 112 (mEq/L)    CO2 29  19 - 32 (mEq/L)    Glucose, Bld 118 (*) 70 - 99 (mg/dL)    BUN 13  6 - 23 (mg/dL)    Creatinine, Ser 6.29  0.50 - 1.10 (mg/dL)    Calcium 52.8  8.4 - 10.5 (mg/dL)    GFR calc non Af Amer 57 (*) >90 (mL/min)    GFR calc Af Amer 67 (*) >90 (mL/min)   DIFFERENTIAL     Status: Abnormal   Collection Time   06/20/11  3:22 PM      Component Value Range Comment   Neutrophils Relative 76  43  - 77 (%)    Neutro Abs 10.1 (*) 1.7 - 7.7 (K/uL)    Lymphocytes Relative 15  12 - 46 (%)    Lymphs Abs 2.0  0.7 - 4.0 (K/uL)    Monocytes Relative 8  3 - 12 (%)    Monocytes Absolute 1.0  0.1 - 1.0 (K/uL)    Eosinophils Relative 2  0 - 5 (%)    Eosinophils Absolute 0.2  0.0 - 0.7 (K/uL)    Basophils Relative 0  0 - 1 (%)    Basophils Absolute 0.0  0.0 - 0.1 (K/uL)   BASIC METABOLIC PANEL     Status: Abnormal   Collection Time   06/21/11  6:34 AM      Component Value Range Comment   Sodium 140  135 - 145 (mEq/L)    Potassium 4.2  3.5 - 5.1 (mEq/L)  Chloride 100  96 - 112 (mEq/L)    CO2 28  19 - 32 (mEq/L)    Glucose, Bld 141 (*) 70 - 99 (mg/dL)    BUN 12  6 - 23 (mg/dL)    Creatinine, Ser 1.61  0.50 - 1.10 (mg/dL)    Calcium 9.4  8.4 - 10.5 (mg/dL)    GFR calc non Af Amer 53 (*) >90 (mL/min)    GFR calc Af Amer 61 (*) >90 (mL/min)   CBC     Status: Abnormal   Collection Time   06/21/11  6:34 AM      Component Value Range Comment   WBC 11.0 (*) 4.0 - 10.5 (K/uL)    RBC 4.16  3.87 - 5.11 (MIL/uL)    Hemoglobin 11.5 (*) 12.0 - 15.0 (g/dL)    HCT 09.6 (*) 04.5 - 46.0 (%)    MCV 86.3  78.0 - 100.0 (fL)    MCH 27.6  26.0 - 34.0 (pg)    MCHC 32.0  30.0 - 36.0 (g/dL)    RDW 40.9  81.1 - 91.4 (%)    Platelets 514 (*) 150 - 400 (K/uL)   CBC     Status: Abnormal   Collection Time   06/22/11  7:10 AM      Component Value Range Comment   WBC 8.8  4.0 - 10.5 (K/uL)    RBC 4.04  3.87 - 5.11 (MIL/uL)    Hemoglobin 11.1 (*) 12.0 - 15.0 (g/dL)    HCT 78.2 (*) 95.6 - 46.0 (%)    MCV 86.1  78.0 - 100.0 (fL)    MCH 27.5  26.0 - 34.0 (pg)    MCHC 31.9  30.0 - 36.0 (g/dL)    RDW 21.3  08.6 - 57.8 (%)    Platelets 493 (*) 150 - 400 (K/uL)     Radiology/Results: Ct Abdomen Pelvis W Contrast  06/21/2011  *RADIOLOGY REPORT*  Clinical Data: Abdominal pain, nausea and vomiting.  Recent appendectomy.  CT ABDOMEN AND PELVIS WITH CONTRAST  Technique:  Multidetector CT imaging of the abdomen and  pelvis was performed following the standard protocol during bolus administration of intravenous contrast.  Contrast: OMNIPAQUE IOHEXOL 300 MG/ML  SOLN  Comparison: CT scan 06/05/2011.  Findings: The lung bases are clear.  Minimal dependent atelectasis.  The liver is stable.  Diffuse fatty infiltration is noted.  A large right hepatic lobe cyst is unchanged.  A small cyst is noted in the left lobe.  Stable cholelithiasis.  No findings for acute cholecystitis.  No common bile duct dilatation.  The pancreas is normal.  The spleen is normal in size.  No focal lesions.  The adrenal glands and kidneys are unremarkable and stable.  The stomach and proximal small bowel are dilated with air-fluid levels.  There is a transition to normal/decompressed small bowel in the mid abdomen anteriorly just below the level of the umbilicus.  There is a small amount of free air nearby and there is moderate inflammation of the right rectus and oblique abdominal muscles along with inflammation in the subcutaneous fat and omental fat. Adjacent to the appendectomy clips is a small irregular inflammatory "mass".  The uterus is surgically absent.  The bladder appears normal.  The colon is decompressed.  No mesenteric or retroperitoneal masses or adenopathy.  IMPRESSION:  Inflammatory process in the right lower quadrant involving the abdominal wall musculature, subcutaneous fat and adjacent omentum. Small areas of free air would not be expected this  far from surgery.  I do not see an obvious abscess but there is inflammatory phlegmon and a small bowel obstruction is present which could be due to a small bowel injury in the anterior abdomen just below the umbilicus.  Original Report Authenticated By: P. Loralie Champagne, M.D.   Dg Abd 2 Views  06/20/2011  *RADIOLOGY REPORT*  Clinical Data: History of nausea and vomiting.  Small-bowel dilatation.  Follow-up. History of recent appendectomy.  ABDOMEN - 2 VIEW  Comparison: 06/20/2011.   Findings: There is continued dilatation of multiple loops of small intestine with multiple air-fluid levels on the upright examination.  The pattern of air fluid levels favors partial small- bowel mechanical or functional obstruction. The degree of dilatation appears similar to prior study.  No pneumoperitoneum is seen. No abnormal intra-abdominal calcification is evident. Surgical clips are seen in the lower abdomen and upper pelvis. There is degenerative spondylosis.  IMPRESSION: Dilatation of multiple loops of small intestine with multiple air- fluid levels with paucity colon gas.  Air fluid level pattern favors partial mechanical small bowel  obstruction. The degree of small-bowel dilatation appears similar to prior study.  History is given of recent appendectomy.  I cannot exclude small focal ileus causing partial  small-bowel obstruction.  Original Report Authenticated By: Crawford Givens, M.D.   Dg Abd Acute W/chest  06/20/2011  *RADIOLOGY REPORT*  Clinical Data: Nausea and vomiting for 2 days.  Recent appendectomy.  ACUTE ABDOMEN SERIES (ABDOMEN 2 VIEW & CHEST 1 VIEW)  Comparison: Portable chest 06/06/2011.  Abdominal pelvic CT 06/05/2011.  Findings: There is improved aeration of the lung bases.  Bibasilar atelectasis has resolved.  The nasogastric tube has been removed. The heart size and mediastinal contours are stable.  Abdominal radiographs demonstrate moderate diffuse small bowel distension with multiple air-fluid levels on the erect examination. The colon is relatively decompressed.  There is no free intraperitoneal air.  Retroperitoneal surgical clips and calcified gallstones are noted.  IMPRESSION:  1.  The bowel gas pattern is suspicious for an early or partial small-bowel obstruction.  In the setting of recent appendectomy, findings could reflect a focal ileus. 2.  Improved basilar aeration.  Original Report Authenticated By: Gerrianne Scale, M.D.    Anti-infectives: Anti-infectives    None       Assessment/Plan: Problem List: Patient Active Problem List  Diagnoses  . Hypertension  . Ruptured appendicitis  . Anxiety  . Hypothyroidism  . Postop check  . Bowel obstruction  . Ileus    No progress yet in resolution of ileus * No surgery found *    LOS: 2 days   Matt B. Daphine Deutscher, MD, [facility] [phone] beeper [phone]  06/22/2011 9:10 AM

## 2011-06-23 NOTE — Progress Notes (Signed)
Patient ID: Meredith Rose, female   DOB: 1950/08/29, 61 y.o.   MRN: [id number]    Subjective: Pt feels about the same.  No flatus  Objective: Vital signs in last 24 hours: Temp:  [97.8 F (36.6 C)-99.3 F (37.4 C)] 97.8 F (36.6 C) (05/24 0605) Pulse Rate:  [60-70] 70  (05/24 0605) Resp:  [16-18] 18  (05/24 0605) BP: (106-123)/(51-65) 123/65 mmHg (05/24 0605) SpO2:  [98 %-100 %] 98 % (05/24 0605) Last BM Date: 06/20/11  Intake/Output from previous day: 05/23 0701 - 05/24 0700 In: 1001 [I.V.:1000] Out: 975 [Urine:125; Emesis/NG output:850] Intake/Output this shift:    PE: Abd: soft, very few BS, ND, incision intact and dressed.  NGT with copious amounts of bilious output. Heart: regular Lungs: CTAB  Lab Results:   Basename 06/22/11 0710 06/21/11 0634  WBC 8.8 11.0*  HGB 11.1* 11.5*  HCT 34.8* 35.9*  PLT 493* 514*   BMET  Basename 06/21/11 0634 06/20/11 1522  NA 140 139  K 4.2 3.5  CL 100 95*  CO2 28 29  GLUCOSE 141* 118*  BUN 12 13  CREATININE 1.10 1.03  CALCIUM 9.4 10.2   PT/INR No results found for this basename: LABPROT:2,INR:2 in the last 72 hours CMP     Component Value Date/Time   NA 140 06/21/2011 0634   K 4.2 06/21/2011 0634   CL 100 06/21/2011 0634   CO2 28 06/21/2011 0634   GLUCOSE 141* 06/21/2011 0634   BUN 12 06/21/2011 0634   CREATININE 1.10 06/21/2011 0634   CALCIUM 9.4 06/21/2011 0634   GFRNONAA 53* 06/21/2011 0634   GFRAA 61* 06/21/2011 0634   Lipase     Component Value Date/Time   LIPASE 21 06/05/2011 1516       Studies/Results: Ct Abdomen Pelvis W Contrast  06/21/2011  *RADIOLOGY REPORT*  Clinical Data: Abdominal pain, nausea and vomiting.  Recent appendectomy.  CT ABDOMEN AND PELVIS WITH CONTRAST  Technique:  Multidetector CT imaging of the abdomen and pelvis was performed following the standard protocol during bolus administration of intravenous contrast.  Contrast: OMNIPAQUE IOHEXOL 300 MG/ML  SOLN  Comparison: CT scan  06/05/2011.  Findings: The lung bases are clear.  Minimal dependent atelectasis.  The liver is stable.  Diffuse fatty infiltration is noted.  A large right hepatic lobe cyst is unchanged.  A small cyst is noted in the left lobe.  Stable cholelithiasis.  No findings for acute cholecystitis.  No common bile duct dilatation.  The pancreas is normal.  The spleen is normal in size.  No focal lesions.  The adrenal glands and kidneys are unremarkable and stable.  The stomach and proximal small bowel are dilated with air-fluid levels.  There is a transition to normal/decompressed small bowel in the mid abdomen anteriorly just below the level of the umbilicus.  There is a small amount of free air nearby and there is moderate inflammation of the right rectus and oblique abdominal muscles along with inflammation in the subcutaneous fat and omental fat. Adjacent to the appendectomy clips is a small irregular inflammatory "mass".  The uterus is surgically absent.  The bladder appears normal.  The colon is decompressed.  No mesenteric or retroperitoneal masses or adenopathy.  IMPRESSION:  Inflammatory process in the right lower quadrant involving the abdominal wall musculature, subcutaneous fat and adjacent omentum. Small areas of free air would not be expected this far from surgery.  I do not see an obvious abscess but there is inflammatory phlegmon  and a small bowel obstruction is present which could be due to a small bowel injury in the anterior abdomen just below the umbilicus.  Original Report Authenticated By: P. Loralie Champagne, M.D.    Anti-infectives: Anti-infectives    None       Assessment/Plan  1. Post op ileus vs PSBO 2. S/p open appy  Plan: 1. Cont NGT and bowel rest 2. Cont wound care as ordered.  3. Due to possibility of extended NPO status, would likely go ahead and place PICC and start TNA  LOS: 3 days    , E 06/23/2011

## 2011-06-24 ENCOUNTER — Inpatient Hospital Stay (HOSPITAL_COMMUNITY): Payer: BC Managed Care – PPO

## 2011-06-24 LAB — PHOSPHORUS: Phosphorus: 3.9 mg/dL (ref 2.3–4.6)

## 2011-06-24 LAB — MAGNESIUM: Magnesium: 1.8 mg/dL (ref 1.5–2.5)

## 2011-06-24 LAB — BASIC METABOLIC PANEL
BUN: 4 mg/dL — ABNORMAL LOW (ref 6–23)
Calcium: 9.4 mg/dL (ref 8.4–10.5)
Chloride: 103 mEq/L (ref 96–112)
Creatinine, Ser: 0.79 mg/dL (ref 0.50–1.10)
GFR calc Af Amer: >90 mL/min (ref >90)

## 2011-06-24 LAB — GLUCOSE, CAPILLARY: Glucose-Capillary: 103 mg/dL — ABNORMAL HIGH (ref 70–99)

## 2011-06-24 MED ORDER — INSULIN ASPART 100 UNIT/ML ~~LOC~~ SOLN
0.0000 [IU] | Freq: Four times a day (QID) | SUBCUTANEOUS | Status: DC
  Administered 2011-06-25 – 2011-06-27 (×7): 1 [IU] via SUBCUTANEOUS

## 2011-06-24 MED ORDER — WHITE PETROLATUM GEL
Status: AC
  Filled 2011-06-24: qty 5

## 2011-06-24 MED ORDER — POTASSIUM CHLORIDE IN NACL 20-0.45 MEQ/L-% IV SOLN
INTRAVENOUS | Status: DC
  Administered 2011-06-24 – 2011-06-25 (×2): via INTRAVENOUS
  Filled 2011-06-24 (×3): qty 1000

## 2011-06-24 MED ORDER — CLINIMIX E/DEXTROSE (5/15) 5 % IV SOLN
INTRAVENOUS | Status: AC
  Administered 2011-06-24: 18:00:00 via INTRAVENOUS
  Filled 2011-06-24: qty 1000

## 2011-06-24 MED ORDER — SODIUM CHLORIDE 0.9 % IJ SOLN
10.0000 mL | INTRAMUSCULAR | Status: DC | PRN
  Administered 2011-06-27 – 2011-06-28 (×3): 10 mL

## 2011-06-24 MED ORDER — SODIUM CHLORIDE 0.9 % IJ SOLN
10.0000 mL | Freq: Two times a day (BID) | INTRAMUSCULAR | Status: DC
  Administered 2011-06-25 – 2011-06-29 (×2): 10 mL

## 2011-06-24 NOTE — Progress Notes (Signed)
PARENTERAL NUTRITION CONSULT NOTE - INITIAL  Pharmacy Consult for TNA Indication: post-op ileus vs SBO s/p ruptured appendicitis  No Known Allergies  Patient Measurements: Height: 5\' 6"  (167.6 cm) Weight: 208 lb 5.4 oz (94.5 kg) IBW/kg (Calculated) : 59.3  Adjusted Body Weight: 69.9kg Usual Weight:   Vital Signs: Temp: 98.1 F (36.7 C) (05/25 0620) Temp src: Oral (05/25 0620) BP: 120/45 mmHg (05/25 0620) Pulse Rate: 63  (05/25 0620) Intake/Output from previous day: 05/24 0701 - 05/25 0700 In: 2749.3 [I.V.:2749.3] Out: 800 [Urine:350; Emesis/NG output:450] Intake/Output from this shift:    Labs:  [facility] 06/22/11 0710  WBC 8.8  HGB 11.1*  HCT 34.8*  PLT 493*  APTT --  INR --     Basename 06/24/11 0757 06/24/11 0700  NA 140 --  K 3.7 --  CL 103 --  CO2 24 --  GLUCOSE 109* --  BUN 4* --  CREATININE 0.79 --  LABCREA -- --  CREAT24HRUR -- --  CALCIUM 9.4 --  MG -- 1.8  PHOS -- 3.9  PROT -- --  ALBUMIN -- --  AST -- --  ALT -- --  ALKPHOS -- --  BILITOT -- --  BILIDIR -- --  IBILI -- --  PREALBUMIN -- --  TRIG -- --  CHOLHDL -- --  CHOL -- --   Estimated Creatinine Clearance: 85.6 ml/min (by C-G formula based on Cr of 0.79).   No results found for this basename: GLUCAP:3 in the last 72 hours  Medical History: Past Medical History  Diagnosis Date  . Hypertension   . Anxiety   . Ruptured appendicitis 06/03/11  . Hypothyroidism   . Arthritis   . Ovarian cancer 2002  . 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 antiseptic oral rinse  15 mL Mouth Rinse q12n4p  . chlorhexidine  15 mL Mouth Rinse BID  . enoxaparin  40 mg Subcutaneous Q24H  . losartan  100 mg Oral Daily   And  . hydrochlorothiazide  25 mg Oral Daily  . levothyroxine  112 mcg Oral QAC breakfast  . pantoprazole (PROTONIX) IV  40 mg Intravenous QHS  . propranolol  10 mg Oral Daily  . sertraline  100 mg Oral Daily    Insulin Requirements  in the past 24 hours:  No insulin Orders  Current Nutrition:  NPO Once at goal TPN to provide daily average of 1568 Kcal and 96 gm protein, meeting 83% kcal goal and >100% protein goal  Nutritional Goals:  1890-2170 kCal, 70-82 grams of protein per day  IVF= D5 0.45% KCL at 154ml/hr  Assessment: 61 YOF re-admitted 5/21 with post-op ileus vs SBO. She is s/p open appendectomy on 5/7 for perforated appendicitis (discharged 5/13). At home, she was having poor appetite and then developed bilious emesis.  Orders to start TPN.  PICC to be placed today   GI: Ileus vs SBO, 5/22 CT = Inflammatory process in the right lower quadrant and SBO. Also concern for abscess per [facility] notes.  Has NGT to [facility] with output/24h Endo: No h/o DM, Glucoses OK per labs. H/o hyopthyroidism on po sythroid 112 mcg Lytes: Labs WNL Renal: WNL Pulm: Sats OK on RA Cards: h/o HTN, BP controlled on losartan and HCTZ Hepatobil: NA Neuro: h/o anxiety ID:   No antibiotic, WBC WNL and afeb Best Practices: enox, MC  Plan:  - Plan is PICC to be placed today -Start TPN  tonight at 1800, starting at 50% goal and advance as tolerates.  Clinimix E 5/15 at 34ml/hr - Lipids, MVI and trace elements MWF only d/t national shortage - Adjust IVF - Start q6h CBGs and sensitive SSI with start of TPN -TPN labs  Dannielle Huh 06/24/2011,9:53 AM

## 2011-06-24 NOTE — Progress Notes (Signed)
  Subjective: No BM or flatus.  Objective: Vital signs in last 24 hours: Temp:  [97.8 F (36.6 C)-98.5 F (36.9 C)] 98.1 F (36.7 C) (05/25 0620) Pulse Rate:  [63-76] 63  (05/25 0620) Resp:  [18-20] 20  (05/25 0620) BP: (108-121)/(45-62) 120/45 mmHg (05/25 0620) SpO2:  [98 %-99 %] 98 % (05/25 0620) Last BM Date: 06/20/11  Intake/Output from previous day: 05/24 0701 - 05/25 0700 In: 2749.3 [I.V.:2749.3] Out: 800 [Urine:350; Emesis/NG output:450]-bilious Intake/Output this shift:    PE: Abd-soft, incisions clean and intact, hypoactive bowel sounds.  Lab Results:   [facility] 06/22/11 0710  WBC 8.8  HGB 11.1*  HCT 34.8*  PLT 493*   BMET  Basename 06/24/11 0757  NA 140  K 3.7  CL 103  CO2 24  GLUCOSE 109*  BUN 4*  CREATININE 0.79  CALCIUM 9.4   PT/INR No results found for this basename: LABPROT:2,INR:2 in the last 72 hours Comprehensive Metabolic Panel:    Component Value Date/Time   NA 140 06/24/2011 0757   K 3.7 06/24/2011 0757   CL 103 06/24/2011 0757   CO2 24 06/24/2011 0757   BUN 4* 06/24/2011 0757   CREATININE 0.79 06/24/2011 0757   GLUCOSE 109* 06/24/2011 0757   CALCIUM 9.4 06/24/2011 0757     Studies/Results: No results found.  Anti-infectives: Anti-infectives    None      Assessment Principal Problem:  *Ileus vs sbo-no clinical improvement Active Problems:  Ruptured appendicitis s/p laparoscopic converted to open appendectomy and repair of enterotomy 06/06/11  Possible intraabdominal abscess    LOS: 4 days   Plan: Start IV Zosyn.  Check x-rays.  Picc.  Continue ngt.  Dulcolax suppository   , J 06/24/2011

## 2011-06-24 NOTE — Progress Notes (Addendum)
Nutrition Follow-up/Consult for new TPN  RD drawn to chart 2/2 initiation of TPN. Note that initial nutrition assessment was completed by RD on 5/22.   CT revealed partial bowel obstruction on 5/22.  Per TPN pharmacist note, plan is for PICC to be placed today and TPN to start at 50% of goal tonight; patient to receive TPN with Clinimix E 5/15 @ 40 ml/hr tonight.  Lipids, multivitamins, and trace elements are provided 3 times weekly (MWF) due to national backorder. Lipids currently not ordered. This initial rate provides 682 kcal and 48 grams protein daily (based on weekly average).  Meets 36% minimum estimated kcal and 69% minimum estimated protein needs.  Diet Order:  NPO  Meds: Scheduled Meds:   . antiseptic oral rinse  15 mL Mouth Rinse q12n4p  . chlorhexidine  15 mL Mouth Rinse BID  . enoxaparin  40 mg Subcutaneous Q24H  . losartan  100 mg Oral Daily   And  . hydrochlorothiazide  25 mg Oral Daily  . insulin aspart  0-9 Units Subcutaneous Q6H  . levothyroxine  112 mcg Oral QAC breakfast  . pantoprazole (PROTONIX) IV  40 mg Intravenous QHS  . propranolol  10 mg Oral Daily  . sertraline  100 mg Oral Daily   Continuous Infusions:   . 0.45 % NaCl with KCl 20 mEq / L    . dextrose 5 % and 0.45 % NaCl with KCl 20 mEq/L 125 mL/hr at 06/24/11 1313  . TPN (CLINIMIX) +/- additives     PRN Meds:.acetaminophen, acetaminophen, diphenhydrAMINE, diphenhydrAMINE, morphine injection, ondansetron, phenol  Labs:  CMP     Component Value Date/Time   NA 140 06/24/2011 0757   K 3.7 06/24/2011 0757   CL 103 06/24/2011 0757   CO2 24 06/24/2011 0757   GLUCOSE 109* 06/24/2011 0757   BUN 4* 06/24/2011 0757   CREATININE 0.79 06/24/2011 0757   CALCIUM 9.4 06/24/2011 0757   GFRNONAA 88* 06/24/2011 0757   GFRAA >90 06/24/2011 0757   CBG (last 3)  No results found for this basename: GLUCAP:3 in the last 72 hours   Intake/Output Summary (Last 24 hours) at 06/24/11 1355 Last data filed at 06/24/11 0600  Gross per 24 hour  Intake 2749.33 ml  Output    500 ml  Net 2249.33 ml    Weight Status:  208 lb 5.4 oz (94.5 kg) on 5/21, no new wt  Body mass index is 33.63 kg/(m^2). Obese Class I.  Estimated needs:  1890 - 2170 kcal, 70 - 82 grams protein, ~1.8 L/d  Nutrition Dx:  Inadequate oral intake - ongoing.  New goal: TF to meet at least 90% of estimated needs.  Intervention:   1. TPN per pharmacy. 2. RD to continue to follow and make recommendations accordingly.  Monitor:  TPN prescription, weights, labs, I/O's, initiation of diet  Adair Laundry Pager #:  [phone]

## 2011-06-25 LAB — GLUCOSE, CAPILLARY
Glucose-Capillary: 121 mg/dL — ABNORMAL HIGH (ref 70–99)
Glucose-Capillary: 114 mg/dL — ABNORMAL HIGH (ref 70–99)
Glucose-Capillary: 122 mg/dL — ABNORMAL HIGH (ref 70–99)

## 2011-06-25 LAB — PHOSPHORUS: Phosphorus: 3.6 mg/dL (ref 2.3–4.6)

## 2011-06-25 LAB — CBC
MCV: 84.5 fL (ref 78.0–100.0)
Platelets: 388 10*3/uL (ref 150–400)
RBC: 4.13 MIL/uL (ref 3.87–5.11)
WBC: 8.4 10*3/uL (ref 4.0–10.5)

## 2011-06-25 LAB — DIFFERENTIAL
Eosinophils Relative: 5 % (ref 0–5)
Lymphocytes Relative: 22 % (ref 12–46)
Lymphs Abs: 1.8 10*3/uL (ref 0.7–4.0)

## 2011-06-25 LAB — COMPREHENSIVE METABOLIC PANEL
ALT: 36 U/L — ABNORMAL HIGH (ref 0–35)
AST: 33 U/L (ref 0–37)
Alkaline Phosphatase: 83 U/L (ref 39–117)
CO2: 26 mEq/L (ref 19–32)
Calcium: 9.5 mg/dL (ref 8.4–10.5)
Chloride: 102 mEq/L (ref 96–112)
GFR calc non Af Amer: >90 mL/min (ref >90)
Potassium: 3.2 mEq/L — ABNORMAL LOW (ref 3.5–5.1)
Sodium: 138 mEq/L (ref 135–145)
Total Bilirubin: 0.5 mg/dL (ref 0.3–1.2)

## 2011-06-25 MED ORDER — DEXTROSE 10 % IV SOLN
INTRAVENOUS | Status: DC
  Administered 2011-06-25: 15:00:00 via INTRAVENOUS
  Filled 2011-06-25: qty 1000

## 2011-06-25 MED ORDER — POTASSIUM CHLORIDE 10 MEQ/50ML IV SOLN
10.0000 meq | INTRAVENOUS | Status: AC
  Administered 2011-06-25 (×4): 10 meq via INTRAVENOUS
  Filled 2011-06-25 (×4): qty 50

## 2011-06-25 MED ORDER — POTASSIUM CHLORIDE 10 MEQ/50ML IV SOLN
10.0000 meq | INTRAVENOUS | Status: DC
  Filled 2011-06-25 (×6): qty 50

## 2011-06-25 MED ORDER — CLINIMIX E/DEXTROSE (5/15) 5 % IV SOLN
INTRAVENOUS | Status: AC
  Administered 2011-06-25: 19:00:00 via INTRAVENOUS
  Filled 2011-06-25: qty 1500

## 2011-06-25 MED ORDER — MAGNESIUM SULFATE 40 MG/ML IJ SOLN
2.0000 g | Freq: Once | INTRAMUSCULAR | Status: AC
  Administered 2011-06-25: 2 g via INTRAVENOUS
  Filled 2011-06-25: qty 50

## 2011-06-25 MED ORDER — POTASSIUM CHLORIDE IN NACL 20-0.45 MEQ/L-% IV SOLN
INTRAVENOUS | Status: AC
  Administered 2011-06-25 – 2011-06-26 (×2): via INTRAVENOUS
  Filled 2011-06-25 (×2): qty 1000

## 2011-06-25 NOTE — Progress Notes (Signed)
Patient ambulated in the hallway. Had a small watery bowel movement as per  Nurse tech and patient report. RN called to room. Patient complaining of gown being wet on right sleeve.TPN accidentally got disconnect from PICC line. IV team aware. Placed on dextrose10% at 40cc/hr per order until TPN available.

## 2011-06-25 NOTE — Progress Notes (Signed)
  Subjective: Still no BM or flatus. No cramping abdominal pain. Objective: Vital signs in last 24 hours: Temp:  [97.7 F (36.5 C)-98.7 F (37.1 C)] 98.1 F (36.7 C) (05/26 0611) Pulse Rate:  [58-71] 71  (05/26 0611) Resp:  [18-20] 18  (05/26 0611) BP: (126-138)/(47-69) 126/47 mmHg (05/26 0611) SpO2:  [95 %-100 %] 95 % (05/26 0611) Last BM Date: 06/20/11  Intake/Output from previous day: 05/25 0701 - 05/26 0700 In: 2631.4 [I.V.:2196.4; TPN:434] Out: 4075 [Urine:2900; Emesis/NG output:1175]-bilious Intake/Output this shift:    PE: Abd-soft, incisions clean and intact, hypoactive bowel sounds, no tenderness  Lab Results:   [facility] 06/25/11 0437  WBC 8.4  HGB 11.3*  HCT 34.9*  PLT 388   BMET  Basename 06/25/11 0437 06/24/11 0757  NA 138 140  K 3.2* 3.7  CL 102 103  CO2 26 24  GLUCOSE 115* 109*  BUN 7 4*  CREATININE 0.71 0.79  CALCIUM 9.5 9.4   PT/INR No results found for this basename: LABPROT:2,INR:2 in the last 72 hours Comprehensive Metabolic Panel:    Component Value Date/Time   NA 138 06/25/2011 0437   K 3.2* 06/25/2011 0437   CL 102 06/25/2011 0437   CO2 26 06/25/2011 0437   BUN 7 06/25/2011 0437   CREATININE 0.71 06/25/2011 0437   GLUCOSE 115* 06/25/2011 0437   CALCIUM 9.5 06/25/2011 0437   AST 33 06/25/2011 0437   ALT 36* 06/25/2011 0437   ALKPHOS 83 06/25/2011 0437   BILITOT 0.5 06/25/2011 0437   PROT 6.7 06/25/2011 0437   ALBUMIN 3.6 06/25/2011 0437     Studies/Results: Dg Abd Acute W/chest  06/24/2011  *RADIOLOGY REPORT*  Clinical Data: Prolonged ileus. Ruptured appendicitis.  ACUTE ABDOMEN SERIES (ABDOMEN 2 VIEW & CHEST 1 VIEW)  Comparison: Radiographs dated 06/20/2011  Findings: NG tube tip is in the distal stomach.  PICC tip is at the cavoatrial junction.  Heart size and vascularity are normal and the lungs are clear except for a tiny area of scarring at the left lung base.  No free air in the abdomen.  There are a few dilated loops of small bowel  in the mid abdomen, decreased since the prior study.  Minimal air in the nondistended colon.  IMPRESSION: Decreasing number of slightly dilated small bowel loops consistent with improving ileus.  Original Report Authenticated By: Gwynn Burly, M.D.    Anti-infectives: Anti-infectives    None      Assessment Principal Problem:  *Ileus vs sbo-no clinical improvement; 5/25 x-rays look better Active Problems:  Ruptured appendicitis s/p laparoscopic converted to open appendectomy and repair of enterotomy 06/06/11  Possible intraabdominal abscess  PC malnutrition-TPN started  Hypokalemia    LOS: 5 days   Plan:  Continue ngt, abxs, TPN.  Ambulate in hall.  Correct potassium deficit.   Adolph Pollack 06/25/2011

## 2011-06-25 NOTE — Progress Notes (Signed)
PARENTERAL NUTRITION CONSULT NOTE - INITIAL  Pharmacy Consult for TNA Indication: post-op ileus vs SBO s/p ruptured appendicitis  No Known Allergies  Patient Measurements: Height: 5\' 6"  (167.6 cm) Weight: 208 lb 5.4 oz (94.5 kg) IBW/kg (Calculated) : 59.3  Adjusted Body Weight: 69.9kg Usual Weight:   Vital Signs: Temp: 98.1 F (36.7 C) (05/26 0611) Temp src: Oral (05/25 2115) BP: 126/47 mmHg (05/26 0611) Pulse Rate: 71  (05/26 0611) Intake/Output from previous day: 05/25 0701 - 05/26 0700 In: 2631.4 [I.V.:2196.4; TPN:434] Out: 4075 [Urine:2900; Emesis/NG output:1175] Intake/Output from this shift:    Labs:  [facility] 06/25/11 0437  WBC 8.4  HGB 11.3*  HCT 34.9*  PLT 388  APTT --  INR --     Basename 06/25/11 0437 06/24/11 0757 06/24/11 0700  NA 138 140 --  K 3.2* 3.7 --  CL 102 103 --  CO2 26 24 --  GLUCOSE 115* 109* --  BUN 7 4* --  CREATININE 0.71 0.79 --  LABCREA -- -- --  CREAT24HRUR -- -- --  CALCIUM 9.5 9.4 --  MG 1.8 -- 1.8  PHOS 3.6 -- 3.9  PROT 6.7 -- --  ALBUMIN 3.6 -- --  AST 33 -- --  ALT 36* -- --  ALKPHOS 83 -- --  BILITOT 0.5 -- --  BILIDIR -- -- --  IBILI -- -- --  PREALBUMIN -- -- --  TRIG -- -- --  CHOLHDL -- -- --  CHOL -- -- --   Estimated Creatinine Clearance: 85.6 ml/min (by C-G formula based on Cr of 0.71).    Basename 06/25/11 0609 06/24/11 2337 06/24/11 1739  GLUCAP 121* 112* 103*    Medical History: Past Medical History  Diagnosis Date  . Hypertension   . Anxiety   . Ruptured appendicitis 06/03/11  . Hypothyroidism   . Arthritis   . Ovarian cancer 2002  . Squamous cell carcinoma 10/2009    right shin    Medications:  Scheduled:     . antiseptic oral rinse  15 mL Mouth Rinse q12n4p  . chlorhexidine  15 mL Mouth Rinse BID  . enoxaparin  40 mg Subcutaneous Q24H  . losartan  100 mg Oral Daily   And  . hydrochlorothiazide  25 mg Oral Daily  . insulin aspart  0-9 Units Subcutaneous Q6H  . levothyroxine   112 mcg Oral QAC breakfast  . pantoprazole (PROTONIX) IV  40 mg Intravenous QHS  . propranolol  10 mg Oral Daily  . sertraline  100 mg Oral Daily  . sodium chloride  10-40 mL Intracatheter Q12H  . white petrolatum        Insulin Requirements in the past 24 hours:  1 unit sensitive SSI  Current Nutrition:  TPN at 62ml/hr providing 682 kcal and 48 grams protein  NPO x ice chips/meds  Once at goal of 85ml/hr-TPN to provide daily average of 1568 Kcal and 96 gm protein, meeting 83% kcal goal and >100% protein goal  Nutritional Goals:  1890-2170 kCal, 70-82 grams of protein per day  IVF= 0.45% KCL at 78ml/hr  Assessment: 61 YOF re-admitted 5/21 with post-op ileus vs SBO. She is s/p open appendectomy on 5/7 for perforated appendicitis (discharged 5/13). At home, she was having poor appetite and then developed bilious emesis.  Orders to start TPN 5/25   GI: Ileus vs SBO, 5/22 CT = Inflammatory process in the right lower quadrant and SBO. Also concern for abscess per [facility] notes.  Has NGT to [facility]  with output/24h which is increased from 5/24 Endo: No h/o DM, CBGs controlled. H/o hyopthyroidism on po sythroid 112 mcg Lytes: K=3.2 (K goal = 4 with ileus), mg = 1.8, Corr Ca=9.82  Renal: SCr stable, good UOP. 24h I/O = -1.4L Pulm: Sats OK on RA Cards: h/o HTN, BP controlled on losartan, propranolol and HCTZ Hepatobil: Trigs = 207, LFTs OK Neuro: h/o anxiety, on zoloft ID:   No antibiotic, WBC WNL and afeb Best Practices: enox, [facility]  Plan:  -Tolerating TPN at 50% goal, so increase tonight's TPN to Clinimix E 5/15 at 59ml/hr and advance to goal tomorrow if tolerates, appears she hasn't eaten well at home for several weeks so monitoring for refeeding.  -Magnesium at low end of normal with hypokalemia so give Magnesium sulfate 2gm x1 IV and KCl x 6 runs - Lipids, MVI and trace elements MWF only d/t national shortage - Adjust IVF rate to 56ml/hr - follow CBGs and insulin  requirements -TPN labs in am, f/u with pending prealbumin  Suzette Battiest, Tad Moore 06/25/2011,8:31 AM

## 2011-06-26 LAB — COMPREHENSIVE METABOLIC PANEL
ALT: 46 U/L — ABNORMAL HIGH (ref 0–35)
BUN: 10 mg/dL (ref 6–23)
CO2: 26 mEq/L (ref 19–32)
Calcium: 9.5 mg/dL (ref 8.4–10.5)
GFR calc Af Amer: >90 mL/min (ref >90)
GFR calc non Af Amer: >90 mL/min (ref >90)
Glucose, Bld: 126 mg/dL — ABNORMAL HIGH (ref 70–99)
Sodium: 139 mEq/L (ref 135–145)
Total Protein: 6.7 g/dL (ref 6.0–8.3)

## 2011-06-26 LAB — DIFFERENTIAL
Basophils Absolute: 0 10*3/uL (ref 0.0–0.1)
Eosinophils Absolute: 0.4 10*3/uL (ref 0.0–0.7)
Eosinophils Relative: 5 % (ref 0–5)
Lymphocytes Relative: 24 % (ref 12–46)
Neutrophils Relative %: 64 % (ref 43–77)

## 2011-06-26 LAB — CBC
MCH: 27.1 pg (ref 26.0–34.0)
MCV: 84.5 fL (ref 78.0–100.0)
Platelets: 335 10*3/uL (ref 150–400)
RBC: 4.14 MIL/uL (ref 3.87–5.11)
RDW: 14.4 % (ref 11.5–15.5)
WBC: 8.5 10*3/uL (ref 4.0–10.5)

## 2011-06-26 LAB — GLUCOSE, CAPILLARY
Glucose-Capillary: 136 mg/dL — ABNORMAL HIGH (ref 70–99)
Glucose-Capillary: 121 mg/dL — ABNORMAL HIGH (ref 70–99)

## 2011-06-26 LAB — MAGNESIUM: Magnesium: 2 mg/dL (ref 1.5–2.5)

## 2011-06-26 MED ORDER — POTASSIUM CHLORIDE IN NACL 20-0.45 MEQ/L-% IV SOLN
INTRAVENOUS | Status: DC
  Administered 2011-06-27: 13:00:00 via INTRAVENOUS
  Filled 2011-06-26 (×4): qty 1000

## 2011-06-26 MED ORDER — FAT EMULSION 20 % IV EMUL
250.0000 mL | INTRAVENOUS | Status: AC
  Administered 2011-06-26: 250 mL via INTRAVENOUS
  Filled 2011-06-26: qty 250

## 2011-06-26 MED ORDER — TRACE MINERALS CR-CU-MN-SE-ZN 10-1000-500-60 MCG/ML IV SOLN
INTRAVENOUS | Status: AC
  Administered 2011-06-26: 17:00:00 via INTRAVENOUS
  Filled 2011-06-26: qty 2000

## 2011-06-26 MED ORDER — POTASSIUM CHLORIDE 10 MEQ/50ML IV SOLN
10.0000 meq | INTRAVENOUS | Status: AC
  Administered 2011-06-26 (×3): 10 meq via INTRAVENOUS
  Filled 2011-06-26 (×3): qty 50

## 2011-06-26 NOTE — Progress Notes (Signed)
PARENTERAL NUTRITION CONSULT NOTE - INITIAL  Pharmacy Consult for TNA Indication: post-op ileus vs SBO s/p ruptured appendicitis  No Known Allergies  Patient Measurements: Height: 5\' 6"  (167.6 cm) Weight: 208 lb 5.4 oz (94.5 kg) IBW/kg (Calculated) : 59.3  Adjusted Body Weight: 69.9kg Usual Weight:   Vital Signs: Temp: 97.5 F (36.4 C) (05/27 0620) Temp src: Oral (05/26 2148) BP: 122/61 mmHg (05/27 0620) Pulse Rate: 90  (05/27 0620) Intake/Output from previous day: 05/26 0701 - 05/27 0700 In: 2349.4 [I.V.:876.4; IV Piggyback:250; TPN:1222] Out: 2850 [Urine:2500; Emesis/NG output:350] Intake/Output from this shift:    Labs:  [facility] 06/26/11 0445 06/25/11 0437  WBC 8.5 8.4  HGB 11.2* 11.3*  HCT 35.0* 34.9*  PLT 335 388  APTT -- --  INR -- --     Basename 06/26/11 0445 06/25/11 0437 06/24/11 0757 06/24/11 0700  NA 139 138 140 --  K 3.5 3.2* 3.7 --  CL 102 102 103 --  CO2 26 26 24  --  GLUCOSE 126* 115* 109* --  BUN 10 7 4* --  CREATININE 0.62 0.71 0.79 --  LABCREA -- -- -- --  CREAT24HRUR -- -- -- --  CALCIUM 9.5 9.5 9.4 --  MG 2.0 1.8 -- 1.8  PHOS 3.5 3.6 -- 3.9  PROT 6.7 6.7 -- --  ALBUMIN 3.5 3.6 -- --  AST 47* 33 -- --  ALT 46* 36* -- --  ALKPHOS 96 83 -- --  BILITOT 0.6 0.5 -- --  BILIDIR -- -- -- --  IBILI -- -- -- --  PREALBUMIN -- 20.6 -- --  TRIG -- 207* -- --  CHOLHDL -- -- -- --  CHOL 152 161 -- --   Estimated Creatinine Clearance: 85.6 ml/min (by C-G formula based on Cr of 0.62).    Basename 06/26/11 0618 06/26/11 0011 06/25/11 1735  GLUCAP 127* 114* 122*    Medical History: Past Medical History  Diagnosis Date  . Hypertension   . Anxiety   . Ruptured appendicitis 06/03/11  . Hypothyroidism   . Arthritis   . Ovarian cancer 2002  . Squamous cell carcinoma 10/2009    right shin    Medications:  Scheduled:     . antiseptic oral rinse  15 mL Mouth Rinse q12n4p  . chlorhexidine  15 mL Mouth Rinse BID  . enoxaparin  40 mg  Subcutaneous Q24H  . losartan  100 mg Oral Daily   And  . hydrochlorothiazide  25 mg Oral Daily  . insulin aspart  0-9 Units Subcutaneous Q6H  . levothyroxine  112 mcg Oral QAC breakfast  . magnesium sulfate 1 - 4 g bolus IVPB  2 g Intravenous Once  . pantoprazole (PROTONIX) IV  40 mg Intravenous QHS  . potassium chloride  10 mEq Intravenous Q1 Hr x 4  . propranolol  10 mg Oral Daily  . sertraline  100 mg Oral Daily  . sodium chloride  10-40 mL Intracatheter Q12H  . white petrolatum      . DISCONTD: potassium chloride  10 mEq Intravenous Q1 Hr x 6    Insulin Requirements in the past 24 hours:  3 unit sensitive SSI  Current Nutrition:  TPN at 39ml/hr providing 1022 kcal and 72grams protein  NPO x ice chips/meds  Once at goal of 98ml/hr-TPN to provide daily average of 1568 Kcal and 96 gm protein, meeting 83% kcal goal and >100% protein goal  Nutritional Goals:  1890-2170 kCal, 70-82 grams of protein per day  IVF= 0.45%  KCL at 28ml/hr  Assessment: 61 YOF re-admitted 5/21 with post-op ileus vs SBO and questionable intra-abd abscess. She is s/p open appendectomy on 5/7 for perforated appendicitis (discharged 5/13). At home, she was having poor appetite and then developed bilious emesis.  Orders to start TPN 5/25.  Baseline prealbumin is WNL at 20.6.   TPN became disconnected 5/26 (~15:00) and infused into patient's bed and RN had to hang D10W until new TPN bag available   GI: Ileus vs SBO, 5/22 CT = Inflammatory process in the right lower quadrant and SBO. Also concern for abscess per [facility] notes.  Has NGT to [facility] with output/24h which is improved form 5/25 Endo: No h/o DM, CBGs controlled. H/o hyopthyroidism on po sythroid 112 mcg Lytes: K=3.5 s/p replacement 5/26 (K goal = 4 with ileus), mg = 2, Corr Ca=9.9 Renal: SCr stable, good UOP. 24h I/O = -0.5L Pulm: Sats OK on RA Cards: h/o HTN, BP controlled on losartan, propranolol and HCTZ Hepatobil: Trigs = 207, AST/ALT sl  elevated, TBili WNL Neuro: h/o anxiety, on zoloft ID:   No antibiotic, ? Of intra-abd abscess. WBC WNL and afeb Best Practices: enox, MC  Plan:  -Tolerating TPN at 75% goal, so increase tonight's TPN goal of Clinimix E 5/15 at 40ml/hr  -KCl x 3 runs - Lipids, MVI and trace elements MWF only d/t national shortage - Adjust IVF rate to 74ml/hr - follow CBGs and insulin requirements - BMP in am -Watch trigs as baseline mildly elevated.  Dannielle Huh 06/26/2011,8:40 AM

## 2011-06-26 NOTE — Progress Notes (Signed)
  Subjective: Has had 2 BMs in 24 hours. Objective: Vital signs in last 24 hours: Temp:  [97.5 F (36.4 C)-98.1 F (36.7 C)] 97.5 F (36.4 C) (05/27 0620) Pulse Rate:  [76-90] 90  (05/27 0620) Resp:  [17-18] 17  (05/27 0620) BP: (122-131)/(61-75) 122/61 mmHg (05/27 0620) SpO2:  [96 %-100 %] 96 % (05/27 0620) Last BM Date: 06/25/11  Intake/Output from previous day: 05/26 0701 - 05/27 0700 In: 2349.4 [I.V.:876.4; IV Piggyback:250; TPN:1222] Out: 2850 [Urine:2500; Emesis/NG output:350]-bilious Intake/Output this shift: Total I/O In: -  Out: 600 [Urine:600]  PE: Abd-soft, incisions clean and intact, hypoactive bowel sounds, no tenderness  Lab Results:   Basename 06/26/11 0445 06/25/11 0437  WBC 8.5 8.4  HGB 11.2* 11.3*  HCT 35.0* 34.9*  PLT 335 388   BMET  Basename 06/26/11 0445 06/25/11 0437  NA 139 138  K 3.5 3.2*  CL 102 102  CO2 26 26  GLUCOSE 126* 115*  BUN 10 7  CREATININE 0.62 0.71  CALCIUM 9.5 9.5   PT/INR No results found for this basename: LABPROT:2,INR:2 in the last 72 hours Comprehensive Metabolic Panel:    Component Value Date/Time   NA 139 06/26/2011 0445   K 3.5 06/26/2011 0445   CL 102 06/26/2011 0445   CO2 26 06/26/2011 0445   BUN 10 06/26/2011 0445   CREATININE 0.62 06/26/2011 0445   GLUCOSE 126* 06/26/2011 0445   CALCIUM 9.5 06/26/2011 0445   AST 47* 06/26/2011 0445   ALT 46* 06/26/2011 0445   ALKPHOS 96 06/26/2011 0445   BILITOT 0.6 06/26/2011 0445   PROT 6.7 06/26/2011 0445   ALBUMIN 3.5 06/26/2011 0445     Studies/Results: Dg Abd Acute W/chest  06/24/2011  *RADIOLOGY REPORT*  Clinical Data: Prolonged ileus. Ruptured appendicitis.  ACUTE ABDOMEN SERIES (ABDOMEN 2 VIEW & CHEST 1 VIEW)  Comparison: Radiographs dated 06/20/2011  Findings: NG tube tip is in the distal stomach.  PICC tip is at the cavoatrial junction.  Heart size and vascularity are normal and the lungs are clear except for a tiny area of scarring at the left lung base.  No free  air in the abdomen.  There are a few dilated loops of small bowel in the mid abdomen, decreased since the prior study.  Minimal air in the nondistended colon.  IMPRESSION: Decreasing number of slightly dilated small bowel loops consistent with improving ileus.  Original Report Authenticated By: Gwynn Burly, M.D.    Anti-infectives: Anti-infectives    None      Assessment Principal Problem:  *Ileus vs sbo-bowel function starting to return Active Problems:  Ruptured appendicitis s/p laparoscopic converted to open appendectomy and repair of enterotomy 06/06/11  Possible intraabdominal abscess  PC malnutrition-TPN started  Hypokalemia-corrected    LOS: 6 days   Plan:  Clamp ngt.  Try clear liquids.  Check x-rays 5/28.  Continue TPN.   Adolph Pollack 06/26/2011

## 2011-06-27 ENCOUNTER — Inpatient Hospital Stay (HOSPITAL_COMMUNITY): Payer: BC Managed Care – PPO

## 2011-06-27 LAB — BASIC METABOLIC PANEL
CO2: 26 mEq/L (ref 19–32)
Chloride: 102 mEq/L (ref 96–112)
Sodium: 138 mEq/L (ref 135–145)

## 2011-06-27 LAB — GLUCOSE, CAPILLARY: Glucose-Capillary: 120 mg/dL — ABNORMAL HIGH (ref 70–99)

## 2011-06-27 MED ORDER — POTASSIUM CHLORIDE 10 MEQ/50ML IV SOLN
10.0000 meq | INTRAVENOUS | Status: AC
  Administered 2011-06-27 (×6): 10 meq via INTRAVENOUS
  Filled 2011-06-27 (×6): qty 50

## 2011-06-27 MED ORDER — CLINIMIX E/DEXTROSE (5/15) 5 % IV SOLN
INTRAVENOUS | Status: DC
  Administered 2011-06-27: 17:00:00 via INTRAVENOUS
  Filled 2011-06-27: qty 2000

## 2011-06-27 NOTE — Progress Notes (Signed)
Nutrition Follow-up  Diet Order:  Full liquids  Pt has been tolerating clears well.  Reports some flatus, and per chart had 2 BMs (5/26), 3 BMs (5/27).   Pt just had NGT removed and states her nose is sore, but feeling much better.  She reports hunger and is anticipating diet advancement.  Lunch will be her initial trial of full liquids.  Pt continued Clinimix 5/15 @ 80 mL/hr with 20% IV liquids @ 10 mL/hr MWF due to national backorder. TPN provides 1568 kcal, 96g protein.  Meds: Scheduled Meds:   . antiseptic oral rinse  15 mL Mouth Rinse q12n4p  . chlorhexidine  15 mL Mouth Rinse BID  . enoxaparin  40 mg Subcutaneous Q24H  . losartan  100 mg Oral Daily   And  . hydrochlorothiazide  25 mg Oral Daily  . insulin aspart  0-9 Units Subcutaneous Q6H  . levothyroxine  112 mcg Oral QAC breakfast  . pantoprazole (PROTONIX) IV  40 mg Intravenous QHS  . potassium chloride  10 mEq Intravenous Q1 Hr x 3  . potassium chloride  10 mEq Intravenous Q1 Hr x 6  . propranolol  10 mg Oral Daily  . sertraline  100 mg Oral Daily  . sodium chloride  10-40 mL Intracatheter Q12H   Continuous Infusions:   . 0.45 % NaCl with KCl 20 mEq / L 65 mL/hr at 06/26/11 1534  . 0.45 % NaCl with KCl 20 mEq / L 45 mL/hr at 06/26/11 1800  . fat emulsion 250 mL (06/26/11 1705)  . TPN (CLINIMIX) +/- additives 60 mL/hr at 06/25/11 1836  . TPN (CLINIMIX) +/- additives    . TPN (CLINIMIX) +/- additives 80 mL/hr at 06/26/11 1705   PRN Meds:.acetaminophen, acetaminophen, diphenhydrAMINE, diphenhydrAMINE, morphine injection, ondansetron, phenol, sodium chloride  Labs:  CMP     Component Value Date/Time   NA 138 06/27/2011 0500   K 3.1* 06/27/2011 0500   CL 102 06/27/2011 0500   CO2 26 06/27/2011 0500   GLUCOSE 115* 06/27/2011 0500   BUN 13 06/27/2011 0500   CREATININE 0.57 06/27/2011 0500   CALCIUM 9.3 06/27/2011 0500   PROT 6.7 06/26/2011 0445   ALBUMIN 3.5 06/26/2011 0445   AST 47* 06/26/2011 0445   ALT 46* 06/26/2011 0445    ALKPHOS 96 06/26/2011 0445   BILITOT 0.6 06/26/2011 0445   GFRNONAA >90 06/27/2011 0500   GFRAA >90 06/27/2011 0500     Intake/Output Summary (Last 24 hours) at 06/27/11 1244 Last data filed at 06/27/11 0900  Gross per 24 hour  Intake   4568 ml  Output   3700 ml  Net    868 ml    Weight Status:  No new wt, RD unable to weigh pt at this time due to pt in chair.  Nutrition Dx:  Inadequate oral intake, ongoing  Intervention:   1.  Parenteral nutrition; continue per PharmD management. 2. Modify diet; advancement per MD to Mechanical soft goal.    Monitor:   1.  Food/Beverage; diet advancement with tolerance. 2.  Parenteral nutrition; continued tolerance, wean per MD discretion once pt able to advance to solid food.   Hoyt Koch Pager #:  [phone]

## 2011-06-27 NOTE — Progress Notes (Signed)
PARENTERAL NUTRITION CONSULT NOTE - INITIAL  Pharmacy Consult for TNA Indication: post-op ileus vs SBO s/p ruptured appendicitis  No Known Allergies  Patient Measurements: Height: 5\' 6"  (167.6 cm) Weight: 208 lb 5.4 oz (94.5 kg) IBW/kg (Calculated) : 59.3  Adjusted Body Weight: 69.9kg Usual Weight:   Vital Signs: Temp: 97.7 F (36.5 C) (05/28 0619) Temp src: Oral (05/28 0619) BP: 113/62 mmHg (05/28 0619) Pulse Rate: 71  (05/28 0619) Intake/Output from previous day: 05/27 0701 - 05/28 0700 In: 4328 [P.O.:240; I.V.:1729; TPN:2359] Out: 4200 [Urine:4100; Emesis/NG output:100] Intake/Output from this shift:    Labs:  [facility] 06/26/11 0445 06/25/11 0437  WBC 8.5 8.4  HGB 11.2* 11.3*  HCT 35.0* 34.9*  PLT 335 388  APTT -- --  INR -- --     Basename 06/27/11 0500 06/26/11 0445 06/25/11 0437  NA 138 139 138  K 3.1* 3.5 3.2*  CL 102 102 102  CO2 26 26 26   GLUCOSE 115* 126* 115*  BUN 13 10 7   CREATININE 0.57 0.62 0.71  LABCREA -- -- --  CREAT24HRUR -- -- --  CALCIUM 9.3 9.5 9.5  MG -- 2.0 1.8  PHOS -- 3.5 3.6  PROT -- 6.7 6.7  ALBUMIN -- 3.5 3.6  AST -- 47* 33  ALT -- 46* 36*  ALKPHOS -- 96 83  BILITOT -- 0.6 0.5  BILIDIR -- -- --  IBILI -- -- --  PREALBUMIN -- -- 20.6  TRIG -- -- 207*  CHOLHDL -- -- --  CHOL -- 152 161   Estimated Creatinine Clearance: 85.6 ml/min (by C-G formula based on Cr of 0.57).    Basename 06/27/11 0605 06/26/11 2357 06/26/11 1744  GLUCAP 120* 104* 121*    Medical History: Past Medical History  Diagnosis Date  . Hypertension   . Anxiety   . Ruptured appendicitis 06/03/11  . Hypothyroidism   . Arthritis   . Ovarian cancer 2002  . Squamous cell carcinoma 10/2009    right shin    Medications:  Scheduled:     . antiseptic oral rinse  15 mL Mouth Rinse q12n4p  . chlorhexidine  15 mL Mouth Rinse BID  . enoxaparin  40 mg Subcutaneous Q24H  . losartan  100 mg Oral Daily   And  . hydrochlorothiazide  25 mg Oral Daily    . insulin aspart  0-9 Units Subcutaneous Q6H  . levothyroxine  112 mcg Oral QAC breakfast  . pantoprazole (PROTONIX) IV  40 mg Intravenous QHS  . potassium chloride  10 mEq Intravenous Q1 Hr x 3  . propranolol  10 mg Oral Daily  . sertraline  100 mg Oral Daily  . sodium chloride  10-40 mL Intracatheter Q12H    Insulin Requirements in the past 24 hours:  3 unit sensitive SSI  Current Nutrition:  TPN at 69ml/hr providing  provide daily average of 1568 Kcal and 96 gm protein, meeting 83% kcal goal and >100% protein goal  NPO x ice chips/meds  Nutritional Goals:  1890-2170 kCal, 70-82 grams of protein per day  IVF= 0.45% KCL at 8ml/hr  Assessment: 61 YOF re-admitted 5/21 with post-op ileus vs SBO and questionable intra-abd abscess. She is s/p open appendectomy on 5/7 for perforated appendicitis (discharged 5/13). At home, she was having poor appetite and then developed bilious emesis.  Orders to start TPN 5/25. Noted patient with 2BM 5/27 and 3BM today. Baseline prealbumin is WNL at 20.6.    GI: Ileus vs SBO, 5/22 CT =  Inflammatory process in the right lower quadrant and SBO. Also concern for abscess per [facility] notes.  Has NGT to LIWS with output/24h (improving). Ileus resolving as patient with 3 BM today. Plans to clamp NG tube and try clear liquids.  Endo: No h/o DM, CBGs controlled. H/o hyopthyroidism on po sythroid 112 mcg Lytes: K=3.1 s/p replacement 5/27 (K goal = 4 with ileus), Corr Ca=9.7 Renal: SCr stable, good UOP at 1.82ml/kg/24h  Pulm: Sats OK on RA Cards: h/o HTN, BP controlled on losartan, propranolol and HCTZ Hepatobil: Trigs = 207, AST/ALT sl elevated, TBili WNL Neuro: h/o anxiety, on zoloft ID:   No antibiotic, ? Of intra-abd abscess. WBC WNL and afeb Best Practices: enox, MC  Plan:  -Continue clinimix E 5/15 at 21ml/hr -KCl x 6 runs - Lipids, MVI and trace elements MWF only d/t national shortage - Follow CBGs and insulin requirements - BMP in  am -Watch trigs as baseline mildly elevated.  Thank you,  Brett Fairy, PharmD Pager: [phone]  06/27/2011 8:57 AM

## 2011-06-27 NOTE — Progress Notes (Signed)
D/C NGT. Diet as tolerated.

## 2011-06-27 NOTE — Progress Notes (Signed)
Patient ID: Meredith Rose, female   DOB: [birth year], 61 y.o.   MRN: [id number]    Subjective: Pt feels ok.  No nausea with NG clamped.  Tolerating clears.  Had some flatus, had 2 BMs yesterday.  Objective: Vital signs in last 24 hours: Temp:  [97.7 F (36.5 C)-98.1 F (36.7 C)] 97.7 F (36.5 C) 07/06/2022 0619) Pulse Rate:  [71-82] 71  July 06, 2022 0619) Resp:  [17-18] 18  2022-07-06 0619) BP: (113-138)/(50-68) 113/62 mmHg 07-06-2022 0619) SpO2:  [95 %-99 %] 95 % 07-06-22 0619) Last BM Date: 06/26/11  Intake/Output from previous day: 05/27 0701 - 07-06-22 0700 In: 4328 [P.O.:240; I.V.:1729; TPN:2359] Out: 4200 [Urine:4100; Emesis/NG output:100] Intake/Output this shift:    PE: Abd: soft, NT, ND, +BS, wound clean  Heart: regular Lungs: CTAB  Lab Results:   Basename 06/26/11 0445 06/25/11 0437  WBC 8.5 8.4  HGB 11.2* 11.3*  HCT 35.0* 34.9*  PLT 335 388   BMET  Basename 07-06-11 0500 06/26/11 0445  NA 138 139  K 3.1* 3.5  CL 102 102  CO2 26 26  GLUCOSE 115* 126*  BUN 13 10  CREATININE 0.57 0.62  CALCIUM 9.3 9.5   PT/INR No results found for this basename: LABPROT:2,INR:2 in the last 72 hours CMP     Component Value Date/Time   NA 138 07-06-11 0500   K 3.1* 06-Jul-2011 0500   CL 102 07-06-2011 0500   CO2 26 07-06-11 0500   GLUCOSE 115* 06-Jul-2011 0500   BUN 13 07/06/2011 0500   CREATININE 0.57 2011/07/06 0500   CALCIUM 9.3 07-06-2011 0500   PROT 6.7 06/26/2011 0445   ALBUMIN 3.5 06/26/2011 0445   AST 47* 06/26/2011 0445   ALT 46* 06/26/2011 0445   ALKPHOS 96 06/26/2011 0445   BILITOT 0.6 06/26/2011 0445   GFRNONAA >90 2011/07/06 0500   GFRAA >90 July 06, 2011 0500   Lipase     Component Value Date/Time   LIPASE 21 06/05/2011 1516       Studies/Results: Dg Abd 2 Views  July 06, 2011  *RADIOLOGY REPORT*  Clinical Data: Abdominal distention, ileus, appendectomy  ABDOMEN - 2 VIEW  Comparison: 06/24/2011  Findings: NG tube within the stomach.  Stomach is decompressed. Small bowel  dilatation persist in the left abdomen with associated air fluid levels on the upright exam.  Findings compatible with residual partial obstruction versus ileus.  No significant interval change.  Postop changes in the lower abdomen.  IMPRESSION: Stable dilated small bowel loops with air-fluid levels.  No significant interval change.  Original Report Authenticated By: Judie Petit. Ruel Favors, M.D.    Anti-infectives: Anti-infectives    None       Assessment/Plan  1. Ileus vs PSBO s/p open appy  Plan: 1. Dc NGT and try full liquids.   LOS: 7 days    , E 07-06-2011

## 2011-06-28 LAB — BASIC METABOLIC PANEL
CO2: 26 mEq/L (ref 19–32)
Calcium: 9.5 mg/dL (ref 8.4–10.5)
Creatinine, Ser: 0.6 mg/dL (ref 0.50–1.10)
Glucose, Bld: 118 mg/dL — ABNORMAL HIGH (ref 70–99)
Sodium: 140 mEq/L (ref 135–145)

## 2011-06-28 LAB — GLUCOSE, CAPILLARY
Glucose-Capillary: 117 mg/dL — ABNORMAL HIGH (ref 70–99)
Glucose-Capillary: 98 mg/dL (ref 70–99)

## 2011-06-28 MED ORDER — CLINIMIX E/DEXTROSE (5/15) 5 % IV SOLN: INTRAVENOUS | Status: AC

## 2011-06-28 MED ORDER — PANTOPRAZOLE SODIUM 40 MG PO TBEC
40.0000 mg | DELAYED_RELEASE_TABLET | Freq: Every day | ORAL | Status: DC
  Administered 2011-06-28: 40 mg via ORAL
  Filled 2011-06-28: qty 1

## 2011-06-28 MED ORDER — CLINIMIX E/DEXTROSE (5/15) 5 % IV SOLN: INTRAVENOUS | Status: DC

## 2011-06-28 NOTE — Progress Notes (Signed)
PARENTERAL NUTRITION CONSULT NOTE - INITIAL  Pharmacy Consult for TNA Indication: post-op ileus vs SBO s/p ruptured appendicitis  No Known Allergies  Patient Measurements: Height: 5\' 6"  (167.6 cm) Weight: 208 lb 5.4 oz (94.5 kg) IBW/kg (Calculated) : 59.3  Adjusted Body Weight: 69.9kg Usual Weight:   Vital Signs: Temp: 98.2 F (36.8 C) (05/29 0539) Temp src: Oral (05/29 0539) BP: 112/54 mmHg (05/29 0634) Pulse Rate: 65  (05/29 0634) Intake/Output from previous day: 05/28 0701 - 05/29 0700 In: 3688.2 [P.O.:360; I.V.:1176.6; IV Piggyback:150; TPN:2001.6] Out: 900 [Urine:900] Intake/Output from this shift:    Labs:  [facility] 06/26/11 0445  WBC 8.5  HGB 11.2*  HCT 35.0*  PLT 335  APTT --  INR --     Basename 06/28/11 0515 06/27/11 0500 06/26/11 0445  NA 140 138 139  K 3.6 3.1* 3.5  CL 103 102 102  CO2 26 26 26   GLUCOSE 118* 115* 126*  BUN 15 13 10   CREATININE 0.60 0.57 0.62  LABCREA -- -- --  CREAT24HRUR -- -- --  CALCIUM 9.5 9.3 9.5  MG -- -- 2.0  PHOS -- -- 3.5  PROT -- -- 6.7  ALBUMIN -- -- 3.5  AST -- -- 47*  ALT -- -- 46*  ALKPHOS -- -- 96  BILITOT -- -- 0.6  BILIDIR -- -- --  IBILI -- -- --  PREALBUMIN -- -- --  TRIG -- -- --  CHOLHDL -- -- --  CHOL -- -- 152   Estimated Creatinine Clearance: 85.6 ml/min (by C-G formula based on Cr of 0.6).    Basename 06/28/11 0536 06/27/11 2349 06/27/11 1754  GLUCAP 117* 113* 135*    Medical History: Past Medical History  Diagnosis Date  . Hypertension   . Anxiety   . Ruptured appendicitis 06/03/11  . Hypothyroidism   . Arthritis   . Ovarian cancer 2002  . Squamous cell carcinoma 10/2009    right shin    Medications:  Scheduled:     . enoxaparin  40 mg Subcutaneous Q24H  . losartan  100 mg Oral Daily   And  . hydrochlorothiazide  25 mg Oral Daily  . insulin aspart  0-9 Units Subcutaneous Q6H  . levothyroxine  112 mcg Oral QAC breakfast  . pantoprazole (PROTONIX) IV  40 mg Intravenous QHS   . potassium chloride  10 mEq Intravenous Q1 Hr x 6  . propranolol  10 mg Oral Daily  . sertraline  100 mg Oral Daily  . sodium chloride  10-40 mL Intracatheter Q12H  . DISCONTD: antiseptic oral rinse  15 mL Mouth Rinse q12n4p  . DISCONTD: chlorhexidine  15 mL Mouth Rinse BID    Insulin Requirements in the past 24 hours:  2 units  Current Nutrition:  TPN at 24ml/hr providing  provide daily average of 1568 Kcal and 96 gm protein, meeting 83% kcal goal and >100% protein goal  Advanced to full liquid diet   Nutritional Goals:  1890-2170 kCal, 70-82 grams of protein per day  IVF= 0.45% KCL at 82ml/hr  Assessment: 61 YOF re-admitted 5/21 with post-op ileus vs SBO and questionable intra-abd abscess. She is s/p open appendectomy on 5/7 for perforated appendicitis (discharged 5/13). At home, she was having poor appetite and then developed bilious emesis.  Orders to start TPN 5/25. Noted patient with +flatus/bowel movements and NGT d/c'd and started on full liquid diet 5/28. Baseline prealbumin is WNL at 20.6.    GI: Ileus vs SBO, 5/22 CT =  Inflammatory process in the right lower quadrant and SBO. Also concern for abscess per [facility] notes.  NGT d/c'd 5/28 and illeus resolving. On full liquid diet.  Endo: No h/o DM, CBGs controlled. H/o hyopthyroidism on po sythroid 112 mcg Lytes: K=3.6 s/p replacement 5/28 (K goal = 4 with ileus), Other lytes WNL Renal: SCr stable, BUN trending up but WNL, UOP at 0.4 ml/kg/24h (decreased but unknown accuracy)   Pulm: Sats OK on RA Cards: h/o HTN, BP controlled on losartan, propranolol and HCTZ Hepatobil: Trigs = 207, AST/ALT sl elevated, TBili WNL Neuro: h/o anxiety, on zoloft ID:   No antibiotic, ? Of intra-abd abscess. WBC WNL and afeb Best Practices: enox, MC  Plan:  -Decrease clinimix E 5/15 to 80ml/hr for 2 hours then off as directed by MD as she advances to mechanical soft - Change protonix to PO  -D/c TNA labs - F/U tolerance of diet    Thank you,  Brett Fairy, PharmD Pager: [phone]  06/28/2011 8:41 AM

## 2011-06-28 NOTE — Progress Notes (Signed)
Advance diet, hopefully home tomorrow.

## 2011-06-28 NOTE — Progress Notes (Signed)
Patient ID: Meredith Rose, female   DOB: May 17, 1950, 61 y.o.   MRN: [id number]    Subjective: Pt feels well.  Tolerating full liquids with no issues.  No nausea.  Passing flatus and had another BM yesterday.  Objective: Vital signs in last 24 hours: Temp:  [97.9 F (36.6 C)-98.2 F (36.8 C)] 98.2 F (36.8 C) (05/29 0539) Pulse Rate:  [65-73] 65  (05/29 0634) Resp:  [17-19] 17  (05/29 0539) BP: (105-133)/(34-56) 112/54 mmHg (05/29 0634) SpO2:  [98 %-100 %] 99 % (05/29 0539) Last BM Date: 26-Jul-2011  Intake/Output from previous day: 2022-07-26 0701 - 05/29 0700 In: 3688.2 [P.O.:360; I.V.:1176.6; IV Piggyback:150; TPN:2001.6] Out: 900 [Urine:900] Intake/Output this shift:    PE: Abd: soft, NT, ND  Lab Results:   [facility] 06/26/11 0445  WBC 8.5  HGB 11.2*  HCT 35.0*  PLT 335   BMET  Basename 06/28/11 0515 26-Jul-2011 0500  NA 140 138  K 3.6 3.1*  CL 103 102  CO2 26 26  GLUCOSE 118* 115*  BUN 15 13  CREATININE 0.60 0.57  CALCIUM 9.5 9.3   PT/INR No results found for this basename: LABPROT:2,INR:2 in the last 72 hours CMP     Component Value Date/Time   NA 140 06/28/2011 0515   K 3.6 06/28/2011 0515   CL 103 06/28/2011 0515   CO2 26 06/28/2011 0515   GLUCOSE 118* 06/28/2011 0515   BUN 15 06/28/2011 0515   CREATININE 0.60 06/28/2011 0515   CALCIUM 9.5 06/28/2011 0515   PROT 6.7 06/26/2011 0445   ALBUMIN 3.5 06/26/2011 0445   AST 47* 06/26/2011 0445   ALT 46* 06/26/2011 0445   ALKPHOS 96 06/26/2011 0445   BILITOT 0.6 06/26/2011 0445   GFRNONAA >90 06/28/2011 0515   GFRAA >90 06/28/2011 0515   Lipase     Component Value Date/Time   LIPASE 21 06/05/2011 1516       Studies/Results: Dg Abd 2 Views  2011-07-26  *RADIOLOGY REPORT*  Clinical Data: Abdominal distention, ileus, appendectomy  ABDOMEN - 2 VIEW  Comparison: 06/24/2011  Findings: NG tube within the stomach.  Stomach is decompressed. Small bowel dilatation persist in the left abdomen with associated air fluid levels on  the upright exam.  Findings compatible with residual partial obstruction versus ileus.  No significant interval change.  Postop changes in the lower abdomen.  IMPRESSION: Stable dilated small bowel loops with air-fluid levels.  No significant interval change.  Original Report Authenticated By: Judie Petit. Ruel Favors, M.D.    Anti-infectives: Anti-infectives    None       Assessment/Plan  1. Ileus vs PSBO, improving 2. S/p open appy several weeks ago 3. PCM/ TNA  Plan: 1. Will advance to low fiber diet today. 2. Wean TNA to off 3. If tolerates all this may be stable for dc tomorrow  LOS: 8 days    , E 06/28/2011

## 2011-06-29 NOTE — Discharge Summary (Signed)
Doing better. Hopefully home, follow up with Dr. Lindie Spruce.

## 2011-06-29 NOTE — Progress Notes (Signed)
DC home with husband. Verbally understood DC instructions. No questions asked. 

## 2011-06-29 NOTE — Discharge Summary (Signed)
Patient ID: Meredith Rose MRN: [id number] DOB/AGE: Jan 25, 1951 61 y.o.  Admit date: 06/20/2011 Discharge date: 06/29/2011  Procedures: none  Consults: None  Reason for Admission: This is a 61 yo female who underwent an open appendectomy in early May.  She saw Dr. Lindie Spruce in the office for a follow up visit and was noted to have nausea and vomiting that had just started earlier that morning.  She was sent to the radiology department for an abdominal x-ray.  She was found to have an ileus.  She was then admitted.  Admission Diagnoses:  1. Post-op ileus 2. S/p open appendectomy 3. HTN  Hospital Course: The patient was admitted and made NPO.  She had a CBC checked the following morning.  Her WBC was slightly elevated so we obtained a CT scan to rule out some type of infectious process causing her ileus.  This was fairly unremarkable except for showing PSBO vs ileus.  She continued to have emesis and she had an NGT placed.  This remained in place for several days.  She finally starting getting better and passing stool and flatus.  This tube was ultimately discontinued and her diet was advanced as tolerated.  She was felt stable on HD# 9 for discharge home.  PE: Abd: soft, Nt, Nd, +BS, incision is healed with some scabbing present.  Discharge Diagnoses:  Principal Problem:  *Ileus Active Problems:  Ruptured appendicitis s/p laparoscopic converted to open appendectomy and repair of enterotomy 06/06/11   Discharge Medications: Medication List  As of 06/29/2011 10:20 AM   TAKE these medications         acetaminophen 500 MG tablet   Commonly known as: TYLENOL   Take 1,000 mg by mouth every 6 (six) hours as needed. For pain or fever      levothyroxine 112 MCG tablet   Commonly known as: SYNTHROID, LEVOTHROID   Take 112 mcg by mouth daily.      losartan-hydrochlorothiazide 100-25 MG per tablet   Commonly known as: HYZAAR   Take 1 tablet by mouth daily.      mulitivitamin with minerals  Tabs   Take 1 tablet by mouth daily.      oxycodone 5 MG capsule   Commonly known as: OXY-IR   Take 5 mg by mouth 2 (two) times daily as needed. For pain  Usually doesn't take more than twice a day      promethazine 12.5 MG tablet   Commonly known as: PHENERGAN   Take 1 tablet (12.5 mg total) by mouth every 6 (six) hours as needed for nausea.      propranolol 10 MG tablet   Commonly known as: INDERAL   Take 10 mg by mouth daily.      sertraline 100 MG tablet   Commonly known as: ZOLOFT   Take 100 mg by mouth daily.      Vitamin D 2000 UNITS tablet   Take 2,000 Units by mouth daily.            Discharge Instructions: Follow-up Information    Follow up with WYATT Rene Kocher, MD. Schedule an appointment as soon as possible for a visit in 2 weeks.   Contact information:   [employer] [address] [place] [address] [phone]          Signed: Letha Cape 06/29/2011, 10:20 AM

## 2011-07-03 ENCOUNTER — Other Ambulatory Visit (HOSPITAL_COMMUNITY): Payer: 59

## 2011-07-06 ENCOUNTER — Other Ambulatory Visit (HOSPITAL_COMMUNITY): Payer: 59

## 2011-07-10 ENCOUNTER — Other Ambulatory Visit (HOSPITAL_COMMUNITY): Payer: 59

## 2011-07-11 ENCOUNTER — Encounter (INDEPENDENT_AMBULATORY_CARE_PROVIDER_SITE_OTHER): Payer: BC Managed Care – PPO | Admitting: General Surgery

## 2011-07-12 ENCOUNTER — Encounter (INDEPENDENT_AMBULATORY_CARE_PROVIDER_SITE_OTHER): Payer: Self-pay | Admitting: General Surgery

## 2011-07-12 ENCOUNTER — Ambulatory Visit (INDEPENDENT_AMBULATORY_CARE_PROVIDER_SITE_OTHER): Payer: BC Managed Care – PPO | Admitting: General Surgery

## 2011-07-12 VITALS — BP 114/68 | HR 77 | Temp 97.8°F | Resp 14 | Ht 66.0 in | Wt 209.4 lb

## 2011-07-12 DIAGNOSIS — Z09 Encounter for follow-up examination after completed treatment for conditions other than malignant neoplasm: Secondary | ICD-10-CM

## 2011-07-12 NOTE — Progress Notes (Signed)
HPI The patient is doing very well after his second hospitalization for her to have a ruptured acute appendicitis with postoperative bowel instructions.  PE Her wounds have healed very well. There is no evidence of infection. There is no longer any open areas of her wound.  She has excellent bowel sounds. She is nontender and there are no palpable masses.  Studiy review None.  Assessment  Doing well several weeks status post open appendectomy.  Plan Return to see me on a p.r.n. basis.

## 2011-07-21 ENCOUNTER — Ambulatory Visit (HOSPITAL_COMMUNITY)
Admission: RE | Admit: 2011-07-21 | Payer: BC Managed Care – PPO | Source: Ambulatory Visit | Admitting: Orthopedic Surgery

## 2011-07-21 ENCOUNTER — Encounter (HOSPITAL_COMMUNITY): Admission: RE | Payer: Self-pay | Source: Ambulatory Visit

## 2011-07-21 SURGERY — ARTHROPLASTY, KNEE, TOTAL
Anesthesia: Regional | Laterality: Right

## 2011-09-18 NOTE — Pre-Procedure Instructions (Signed)
20 Meredith Rose  09/18/2011   Your procedure is scheduled on:  Friday September 22, 2011  Report to [facility] at 5:30 AM.  Call this number if you have problems the morning of surgery: [phone]   Remember:   Do not eat food or drink:After Midnight.      Take these medicines the morning of surgery with A SIP OF WATER: synthroid, propranolol, zoloft   Do not wear jewelry, make-up or nail polish.  Do not wear lotions, powders, or perfumes. You may wear deodorant.  Do not shave 48 hours prior to surgery. Men may shave face and neck.  Do not bring valuables to the hospital.  Contacts, dentures or bridgework may not be worn into surgery.  Leave suitcase in the car. After surgery it may be brought to your room.  For patients admitted to the hospital, checkout time is 11:00 AM the day of discharge.   Patients discharged the day of surgery will not be allowed to drive home.  Name and phone number of your driver: family/ / friend  Special Instructions: CHG Shower Use Special Wash: 1/2 bottle night before surgery and 1/2 bottle morning of surgery.   Please read over the following fact sheets that you were given: Pain Booklet, Coughing and Deep Breathing, Blood Transfusion Information, Total Joint Packet, MRSA Information and Surgical Site Infection Prevention

## 2011-09-19 ENCOUNTER — Encounter (HOSPITAL_COMMUNITY)
Admission: RE | Admit: 2011-09-19 | Discharge: 2011-09-19 | Disposition: A | Discharge status: Home / Self Care | Payer: BC Managed Care – PPO | Source: Ambulatory Visit | Attending: Orthopedic Surgery | Admitting: Orthopedic Surgery

## 2011-09-19 ENCOUNTER — Other Ambulatory Visit: Payer: Self-pay | Admitting: Physician Assistant

## 2011-09-19 ENCOUNTER — Encounter (HOSPITAL_COMMUNITY): Payer: Self-pay

## 2011-09-19 LAB — CBC WITH DIFFERENTIAL/PLATELET
Basophils Absolute: 0 10*3/uL (ref 0.0–0.1)
Basophils Relative: 0 % (ref 0–1)
Eosinophils Absolute: 0.3 10*3/uL (ref 0.0–0.7)
Eosinophils Relative: 4 % (ref 0–5)
HCT: 39.3 % (ref 36.0–46.0)
Hemoglobin: 13.5 g/dL (ref 12.0–15.0)
Lymphocytes Relative: 29 % (ref 12–46)
Lymphs Abs: 2.7 10*3/uL (ref 0.7–4.0)
MCH: 28 pg (ref 26.0–34.0)
MCHC: 34.4 g/dL (ref 30.0–36.0)
MCV: 81.5 fL (ref 78.0–100.0)
Monocytes Absolute: 0.5 10*3/uL (ref 0.1–1.0)
Monocytes Relative: 5 % (ref 3–12)
Neutro Abs: 5.6 10*3/uL (ref 1.7–7.7)
Neutrophils Relative %: 62 % (ref 43–77)
Platelets: 270 10*3/uL (ref 150–400)
RBC: 4.82 MIL/uL (ref 3.87–5.11)
RDW: 14 % (ref 11.5–15.5)
WBC: 9.1 10*3/uL (ref 4.0–10.5)

## 2011-09-19 LAB — URINALYSIS, ROUTINE W REFLEX MICROSCOPIC
Bilirubin Urine: NEGATIVE
Ketones, ur: NEGATIVE mg/dL
Leukocytes, UA: NEGATIVE
Nitrite: NEGATIVE
Specific Gravity, Urine: 1.014 (ref 1.005–1.030)
Urobilinogen, UA: 0.2 mg/dL (ref 0.0–1.0)
pH: 6.5 (ref 5.0–8.0)

## 2011-09-19 LAB — APTT: aPTT: 30 seconds (ref 24–37)

## 2011-09-19 LAB — COMPREHENSIVE METABOLIC PANEL
AST: 16 U/L (ref 0–37)
Albumin: 4.2 g/dL (ref 3.5–5.2)
BUN: 15 mg/dL (ref 6–23)
Chloride: 102 mEq/L (ref 96–112)
Creatinine, Ser: 0.63 mg/dL (ref 0.50–1.10)
Total Bilirubin: 0.3 mg/dL (ref 0.3–1.2)
Total Protein: 7.3 g/dL (ref 6.0–8.3)

## 2011-09-19 LAB — TYPE AND SCREEN
ABO/RH(D): A POS
Antibody Screen: NEGATIVE

## 2011-09-19 LAB — PROTIME-INR
INR: 0.98 (ref 0.00–1.49)
Prothrombin Time: 13.2 seconds (ref 11.6–15.2)

## 2011-09-19 LAB — SURGICAL PCR SCREEN
MRSA, PCR: NEGATIVE
Staphylococcus aureus: POSITIVE — AB

## 2011-09-19 LAB — ABO/RH: ABO/RH(D): A POS

## 2011-09-19 NOTE — H&P (Signed)
 NAME: Meredith Rose MRN: #[id number] DATE: September 05, 2011 DOB: 08/15/1950  COMPLAINT:     Follow up right knee osteoarthritis.  HPI:     The patient is a 61-year-old female with history of right knee osteoarthritis; also with a history of hypothyroidism, hypertension, anxiety/depression, and benign essential tremor.  She is here today to discuss right knee osteoarthritis and possible total knee arthroplasty.  She has failed conservative treatments with NSAIDs, pain medications, corticosteroid injection, and viscosupplementation.  She had right knee arthroscopy in December of 2012.  Right knee pain is significant affecting her quality of life and activities of daily living.  It does awaken her from sleep at night.  CURRENT MEDICATIONS:    Synthroid 112 mcg daily, losartan/HCTZ 100/25 mg daily, sertraline 100 mg daily, and propranolol 20 mg daily.   She is not currently on any blood thinners.  ALLERGIES:     NKDA.  PAST MEDICAL HISTORY:   Bilateral knee osteoarthritis, hypothyroidism, hypertension, anxiety/depression, benign essential tremor.  HOSPITALIZATIONS:   Ileus status post appendectomy in 2013, childbirth in 1975 and 1979.  PAST SURGICAL HISTORY:     Tubal ligation 2002, hysterectomy for ovarian cancer also in 2002, left knee arthroscopy 2007, right knee arthroscopy 2012, and appendectomy 2013.  ROS:    Ten point review of systems was obtained and positive for glasses/contacts, high blood pressure, and thyroid problems.   FAMILY HISTORY:    Positive for hypertension and stroke in mother and father, hypertension in brother, sister, and grandparents.  SOCIAL HISTORY:    Nonsmoker, nondrinker.  She is married.  She lives with her husband in a one-story residence.  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  EXAM:     The patient is seated in the examination room in no acute distress.  She is alert, oriented, and appears appropriate age.  Height 5 feet, 6 inches.  Weight  210 pounds.  BMI calculated at 33.9.  Vital signs:  Temperature 96.8 degrees.  Pulse 54.  Respirations 18.  Blood pressure 148/75  HEENT:   PERRLA.  No dentures or oral implants. Neck supple with good range of motion. Chest/Lungs:   Normal breath sounds, clear to auscultation bilaterally.   Heart:   Regular rate and rhythm.  No sign of murmur. Abdomen:   Soft, non tender.  Active bowel sounds in all four quadrants. Rectal/Breast:    Not indicated for surgery. Neuro:   Cranial nerves grossly intact. Musculoskeletal:    Examination of bilateral lower extremities shows she is neurovascularly intact.  She has tenderness to palpation medial and lateral joint lines bilaterally, right greater than left.  Positive patellofemoral crepitus.  Denies calf pain with palpation.  Dorsiflexion and plantar flexion of ankles intact.  X-RAYS:     No images were obtained today.  X-rays taken of the right knee on 03/29/11 show a bone-on-bone medial compartment and an end-stage osteoarthritis knee.  IMPRESSION:      1. Bilateral knee osteoarthritis, right greater than left. 2. Hypothyroidism. 3. Hypertension. 4. Anxiety/depression. 5. Benign essential tremor.  RECOMMENDATIONS:     The risks and benefits of right total knee arthroplasty were discussed again today.  The patient wishes to proceed.  She has a preadmission appointment scheduled on 09/19/11.  Surgery is scheduled for 09/22/11.  She does wish to go home following the surgery.  This is currently set up with [facility] home [facility].  She has obtained preoperative medical and cardiac clearance from her primary care   physician, Dr. Griffin, as well as Dr. Wyatt, the surgeon for her appendectomy.  She is given preoperative instructions. Also discussed postoperative DVT prophylaxis and perioperative antibiotics. She will be given Ancef.  If she has any further questions or concerns prior to surgery she may contact our office.    Josh   P.A.-C/[id number]  Auto-Authenticated by Josh  P.A.-C 

## 2011-09-19 NOTE — Progress Notes (Signed)
Primary Physician - Dr. Valentina Lucks, Ginette Otto Does not have a cardiologist. EKG may 2013 in epic No stress test, echo or cath

## 2011-09-21 MED ORDER — CEFAZOLIN SODIUM-DEXTROSE 2-3 GM-% IV SOLR
2.0000 g | INTRAVENOUS | Status: AC
  Administered 2011-09-22: 2 g via INTRAVENOUS
  Filled 2011-09-21: qty 50

## 2011-09-21 MED ORDER — ACETAMINOPHEN 10 MG/ML IV SOLN
1000.0000 mg | Freq: Four times a day (QID) | INTRAVENOUS | Status: DC
  Administered 2011-09-22: 1000 mg via INTRAVENOUS
  Filled 2011-09-21 (×3): qty 100

## 2011-09-22 ENCOUNTER — Encounter (HOSPITAL_COMMUNITY): Payer: Self-pay | Admitting: *Deleted

## 2011-09-22 ENCOUNTER — Encounter (HOSPITAL_COMMUNITY): Payer: Self-pay | Admitting: Certified Registered"

## 2011-09-22 ENCOUNTER — Ambulatory Visit (HOSPITAL_COMMUNITY): Payer: BC Managed Care – PPO | Admitting: Certified Registered"

## 2011-09-22 ENCOUNTER — Inpatient Hospital Stay (HOSPITAL_COMMUNITY)
Admission: RE | Admit: 2011-09-22 | Discharge: 2011-09-25 | DRG: 209 | Disposition: A | Discharge status: Home Health | Payer: BC Managed Care – PPO | Source: Ambulatory Visit | Attending: Orthopedic Surgery | Admitting: Orthopedic Surgery

## 2011-09-22 ENCOUNTER — Encounter (HOSPITAL_COMMUNITY)
Admission: RE | Disposition: A | Discharge status: Home Health | Payer: Self-pay | Source: Ambulatory Visit | Attending: Orthopedic Surgery

## 2011-09-22 ENCOUNTER — Inpatient Hospital Stay (HOSPITAL_COMMUNITY): Payer: BC Managed Care – PPO

## 2011-09-22 DIAGNOSIS — M1711 Unilateral primary osteoarthritis, right knee: Secondary | ICD-10-CM

## 2011-09-22 DIAGNOSIS — K1370 Unspecified lesions of oral mucosa: Secondary | ICD-10-CM | POA: Diagnosis not present

## 2011-09-22 DIAGNOSIS — Z90710 Acquired absence of both cervix and uterus: Secondary | ICD-10-CM

## 2011-09-22 DIAGNOSIS — Z8543 Personal history of malignant neoplasm of ovary: Secondary | ICD-10-CM

## 2011-09-22 DIAGNOSIS — F329 Major depressive disorder, single episode, unspecified: Secondary | ICD-10-CM | POA: Diagnosis present

## 2011-09-22 DIAGNOSIS — I959 Hypotension, unspecified: Secondary | ICD-10-CM

## 2011-09-22 DIAGNOSIS — G250 Essential tremor: Secondary | ICD-10-CM | POA: Diagnosis present

## 2011-09-22 DIAGNOSIS — F3289 Other specified depressive episodes: Secondary | ICD-10-CM | POA: Diagnosis present

## 2011-09-22 DIAGNOSIS — F411 Generalized anxiety disorder: Secondary | ICD-10-CM | POA: Diagnosis present

## 2011-09-22 DIAGNOSIS — E039 Hypothyroidism, unspecified: Secondary | ICD-10-CM | POA: Diagnosis present

## 2011-09-22 DIAGNOSIS — Z79899 Other long term (current) drug therapy: Secondary | ICD-10-CM

## 2011-09-22 DIAGNOSIS — I10 Essential (primary) hypertension: Secondary | ICD-10-CM | POA: Diagnosis present

## 2011-09-22 DIAGNOSIS — Z85828 Personal history of other malignant neoplasm of skin: Secondary | ICD-10-CM

## 2011-09-22 DIAGNOSIS — E876 Hypokalemia: Secondary | ICD-10-CM

## 2011-09-22 DIAGNOSIS — Z9089 Acquired absence of other organs: Secondary | ICD-10-CM

## 2011-09-22 DIAGNOSIS — M1710 Unilateral primary osteoarthritis, unspecified knee: Principal | ICD-10-CM | POA: Diagnosis present

## 2011-09-22 HISTORY — PX: TOTAL KNEE ARTHROPLASTY: SHX125

## 2011-09-22 SURGERY — ARTHROPLASTY, KNEE, TOTAL
Anesthesia: Regional | Site: Knee | Laterality: Right | Wound class: Clean

## 2011-09-22 MED ORDER — CHLORHEXIDINE GLUCONATE 4 % EX LIQD: 60.0000 mL | Freq: Once | CUTANEOUS | Status: DC

## 2011-09-22 MED ORDER — BISACODYL 10 MG RE SUPP: 10.0000 mg | Freq: Every day | RECTAL | Status: DC | PRN

## 2011-09-22 MED ORDER — SODIUM CHLORIDE 0.9 % IV SOLN: INTRAVENOUS | Status: DC

## 2011-09-22 MED ORDER — ONDANSETRON HCL 4 MG/2ML IJ SOLN: 4.0000 mg | Freq: Four times a day (QID) | INTRAMUSCULAR | Status: DC | PRN

## 2011-09-22 MED ORDER — METOCLOPRAMIDE HCL 5 MG/ML IJ SOLN: 5.0000 mg | Freq: Three times a day (TID) | INTRAMUSCULAR | Status: DC | PRN

## 2011-09-22 MED ORDER — METHOCARBAMOL 100 MG/ML IJ SOLN
500.0000 mg | INTRAVENOUS | Status: AC
  Administered 2011-09-22: 500 mg via INTRAVENOUS
  Filled 2011-09-22: qty 5

## 2011-09-22 MED ORDER — ACETAMINOPHEN 650 MG RE SUPP: 650.0000 mg | Freq: Four times a day (QID) | RECTAL | Status: DC | PRN

## 2011-09-22 MED ORDER — HYDROMORPHONE HCL PF 1 MG/ML IJ SOLN
INTRAMUSCULAR | Status: AC
  Filled 2011-09-22: qty 1

## 2011-09-22 MED ORDER — FENTANYL CITRATE 0.05 MG/ML IJ SOLN
INTRAMUSCULAR | Status: DC | PRN
  Administered 2011-09-22: 50 ug via INTRAVENOUS
  Administered 2011-09-22: 100 ug via INTRAVENOUS
  Administered 2011-09-22: 50 ug via INTRAVENOUS

## 2011-09-22 MED ORDER — LOSARTAN POTASSIUM 50 MG PO TABS
100.0000 mg | ORAL_TABLET | Freq: Every day | ORAL | Status: DC
  Administered 2011-09-22 – 2011-09-23 (×2): 100 mg via ORAL
  Filled 2011-09-22 (×2): qty 2

## 2011-09-22 MED ORDER — ONDANSETRON HCL 4 MG PO TABS: 4.0000 mg | ORAL_TABLET | Freq: Four times a day (QID) | ORAL | Status: DC | PRN

## 2011-09-22 MED ORDER — LOSARTAN POTASSIUM-HCTZ 100-25 MG PO TABS: 1.0000 | ORAL_TABLET | Freq: Every day | ORAL | Status: DC

## 2011-09-22 MED ORDER — HYDROCHLOROTHIAZIDE 25 MG PO TABS
25.0000 mg | ORAL_TABLET | Freq: Every day | ORAL | Status: DC
  Administered 2011-09-22 – 2011-09-23 (×2): 25 mg via ORAL
  Filled 2011-09-22 (×2): qty 1

## 2011-09-22 MED ORDER — LIDOCAINE HCL (CARDIAC) 20 MG/ML IV SOLN
INTRAVENOUS | Status: DC | PRN
  Administered 2011-09-22: 100 mg via INTRAVENOUS

## 2011-09-22 MED ORDER — FLEET ENEMA 7-19 GM/118ML RE ENEM: 1.0000 | ENEMA | Freq: Once | RECTAL | Status: AC | PRN

## 2011-09-22 MED ORDER — PHENOL 1.4 % MT LIQD: 1.0000 | OROMUCOSAL | Status: DC | PRN

## 2011-09-22 MED ORDER — CEFAZOLIN SODIUM 1-5 GM-% IV SOLN
1.0000 g | Freq: Four times a day (QID) | INTRAVENOUS | Status: AC
  Administered 2011-09-22 (×2): 1 g via INTRAVENOUS
  Filled 2011-09-22 (×2): qty 50

## 2011-09-22 MED ORDER — BUPIVACAINE-EPINEPHRINE PF 0.5-1:200000 % IJ SOLN
INTRAMUSCULAR | Status: DC | PRN
  Administered 2011-09-22: 30 mL

## 2011-09-22 MED ORDER — HYDROMORPHONE HCL PF 1 MG/ML IJ SOLN
0.5000 mg | INTRAMUSCULAR | Status: DC | PRN
  Administered 2011-09-22: 0.5 mg via INTRAVENOUS

## 2011-09-22 MED ORDER — DIPHENHYDRAMINE HCL 12.5 MG/5ML PO ELIX: 12.5000 mg | ORAL_SOLUTION | ORAL | Status: DC | PRN

## 2011-09-22 MED ORDER — DOCUSATE SODIUM 100 MG PO CAPS
100.0000 mg | ORAL_CAPSULE | Freq: Two times a day (BID) | ORAL | Status: DC
  Administered 2011-09-22 – 2011-09-25 (×6): 100 mg via ORAL
  Filled 2011-09-22 (×7): qty 1

## 2011-09-22 MED ORDER — ACETAMINOPHEN 10 MG/ML IV SOLN
1000.0000 mg | Freq: Four times a day (QID) | INTRAVENOUS | Status: AC
  Administered 2011-09-22 – 2011-09-23 (×4): 1000 mg via INTRAVENOUS
  Filled 2011-09-22 (×4): qty 100

## 2011-09-22 MED ORDER — SODIUM CHLORIDE 0.9 % IR SOLN
Status: DC | PRN
  Administered 2011-09-22: 3000 mL

## 2011-09-22 MED ORDER — ACETAMINOPHEN 325 MG PO TABS
650.0000 mg | ORAL_TABLET | Freq: Four times a day (QID) | ORAL | Status: DC | PRN
  Administered 2011-09-23 – 2011-09-24 (×2): 650 mg via ORAL
  Filled 2011-09-22 (×2): qty 2

## 2011-09-22 MED ORDER — LEVOTHYROXINE SODIUM 112 MCG PO TABS
112.0000 ug | ORAL_TABLET | Freq: Every day | ORAL | Status: DC
  Administered 2011-09-23 – 2011-09-25 (×3): 112 ug via ORAL
  Filled 2011-09-22 (×4): qty 1

## 2011-09-22 MED ORDER — GLYCOPYRROLATE 0.2 MG/ML IJ SOLN
INTRAMUSCULAR | Status: DC | PRN
  Administered 2011-09-22 (×2): 0.2 mg via INTRAVENOUS

## 2011-09-22 MED ORDER — METHOCARBAMOL 500 MG PO TABS
500.0000 mg | ORAL_TABLET | Freq: Four times a day (QID) | ORAL | Status: DC | PRN
  Administered 2011-09-23 – 2011-09-25 (×6): 500 mg via ORAL
  Filled 2011-09-22 (×7): qty 1

## 2011-09-22 MED ORDER — MENTHOL 3 MG MT LOZG: 1.0000 | LOZENGE | OROMUCOSAL | Status: DC | PRN

## 2011-09-22 MED ORDER — PROPRANOLOL HCL 20 MG PO TABS
20.0000 mg | ORAL_TABLET | Freq: Every day | ORAL | Status: DC
  Administered 2011-09-23 – 2011-09-25 (×3): 20 mg via ORAL
  Filled 2011-09-22 (×3): qty 1

## 2011-09-22 MED ORDER — DEXTROSE 5 % IV SOLN
500.0000 mg | Freq: Four times a day (QID) | INTRAVENOUS | Status: DC | PRN
  Filled 2011-09-22: qty 5

## 2011-09-22 MED ORDER — CEFUROXIME SODIUM 1.5 G IJ SOLR
INTRAMUSCULAR | Status: DC | PRN
  Administered 2011-09-22: 1.5 g

## 2011-09-22 MED ORDER — OXYCODONE HCL 5 MG PO TABS
5.0000 mg | ORAL_TABLET | ORAL | Status: DC | PRN
Start: 2011-09-22 — End: 2011-09-25
  Administered 2011-09-22 – 2011-09-25 (×12): 10 mg via ORAL
  Filled 2011-09-22 (×5): qty 2
  Filled 2011-09-22: qty 1
  Filled 2011-09-22 (×8): qty 2

## 2011-09-22 MED ORDER — CEFUROXIME SODIUM 1.5 G IJ SOLR
INTRAMUSCULAR | Status: AC
  Filled 2011-09-22: qty 1.5

## 2011-09-22 MED ORDER — ALUM & MAG HYDROXIDE-SIMETH 200-200-20 MG/5ML PO SUSP: 30.0000 mL | ORAL | Status: DC | PRN

## 2011-09-22 MED ORDER — METOCLOPRAMIDE HCL 10 MG PO TABS: 5.0000 mg | ORAL_TABLET | Freq: Three times a day (TID) | ORAL | Status: DC | PRN

## 2011-09-22 MED ORDER — MUPIROCIN 2 % EX OINT
TOPICAL_OINTMENT | Freq: Two times a day (BID) | CUTANEOUS | Status: DC
  Administered 2011-09-23 – 2011-09-25 (×4): via NASAL
  Filled 2011-09-22: qty 22

## 2011-09-22 MED ORDER — ENOXAPARIN SODIUM 30 MG/0.3ML ~~LOC~~ SOLN
30.0000 mg | Freq: Two times a day (BID) | SUBCUTANEOUS | Status: DC
  Administered 2011-09-23 – 2011-09-25 (×5): 30 mg via SUBCUTANEOUS
  Filled 2011-09-22 (×7): qty 0.3

## 2011-09-22 MED ORDER — LACTATED RINGERS IV SOLN
INTRAVENOUS | Status: DC | PRN
  Administered 2011-09-22 (×2): via INTRAVENOUS

## 2011-09-22 MED ORDER — SODIUM CHLORIDE 0.9 % IV SOLN
INTRAVENOUS | Status: DC
  Administered 2011-09-23: 01:00:00 via INTRAVENOUS

## 2011-09-22 MED ORDER — SERTRALINE HCL 100 MG PO TABS
100.0000 mg | ORAL_TABLET | Freq: Every day | ORAL | Status: DC
  Administered 2011-09-23 – 2011-09-25 (×3): 100 mg via ORAL
  Filled 2011-09-22 (×3): qty 1

## 2011-09-22 MED ORDER — SENNOSIDES-DOCUSATE SODIUM 8.6-50 MG PO TABS: 1.0000 | ORAL_TABLET | Freq: Every evening | ORAL | Status: DC | PRN

## 2011-09-22 MED ORDER — 0.9 % SODIUM CHLORIDE (POUR BTL) OPTIME
TOPICAL | Status: DC | PRN
  Administered 2011-09-22: 1000 mL

## 2011-09-22 MED ORDER — MUPIROCIN 2 % EX OINT
TOPICAL_OINTMENT | CUTANEOUS | Status: AC
  Administered 2011-09-22: 1 via NASAL
  Filled 2011-09-22: qty 22

## 2011-09-22 MED ORDER — HYDROMORPHONE HCL PF 1 MG/ML IJ SOLN
0.2500 mg | INTRAMUSCULAR | Status: DC | PRN
  Administered 2011-09-22 (×4): 0.5 mg via INTRAVENOUS

## 2011-09-22 MED ORDER — ONDANSETRON HCL 4 MG/2ML IJ SOLN: 4.0000 mg | Freq: Once | INTRAMUSCULAR | Status: DC | PRN

## 2011-09-22 MED ORDER — PROPOFOL 10 MG/ML IV EMUL
INTRAVENOUS | Status: DC | PRN
  Administered 2011-09-22: 200 mg via INTRAVENOUS

## 2011-09-22 MED ORDER — ONDANSETRON HCL 4 MG/2ML IJ SOLN
INTRAMUSCULAR | Status: DC | PRN
  Administered 2011-09-22: 4 mg via INTRAVENOUS

## 2011-09-22 MED ORDER — MIDAZOLAM HCL 5 MG/5ML IJ SOLN
INTRAMUSCULAR | Status: DC | PRN
  Administered 2011-09-22: 2 mg via INTRAVENOUS

## 2011-09-22 SURGICAL SUPPLY — 68 items
BANDAGE ELASTIC 6 VELCRO ST LF (GAUZE/BANDAGES/DRESSINGS) ×2 IMPLANT
BANDAGE ESMARK 6X9 LF (GAUZE/BANDAGES/DRESSINGS) ×1 IMPLANT
BLADE SAGITTAL 25.0X1.19X90 (BLADE) ×2 IMPLANT
BLADE SAW SAG 90X13X1.27 (BLADE) ×2 IMPLANT
BNDG ESMARK 6X9 LF (GAUZE/BANDAGES/DRESSINGS) ×2
BOWL SMART MIX CTS (DISPOSABLE) ×2 IMPLANT
CEMENT HV SMART SET (Cement) ×4 IMPLANT
CLOTH BEACON ORANGE TIMEOUT ST (SAFETY) ×2 IMPLANT
CLSR STERI-STRIP ANTIMIC 1/2X4 (GAUZE/BANDAGES/DRESSINGS) ×2 IMPLANT
COVER BACK TABLE 24X17X13 BIG (DRAPES) IMPLANT
COVER SURGICAL LIGHT HANDLE (MISCELLANEOUS) ×2 IMPLANT
CUFF TOURNIQUET SINGLE 34IN LL (TOURNIQUET CUFF) ×2 IMPLANT
CUFF TOURNIQUET SINGLE 44IN (TOURNIQUET CUFF) IMPLANT
DRAPE INCISE IOBAN 66X45 STRL (DRAPES) ×2 IMPLANT
DRAPE ORTHO SPLIT 77X108 STRL (DRAPES) ×2
DRAPE SURG ORHT 6 SPLT 77X108 (DRAPES) ×2 IMPLANT
DRAPE U-SHAPE 47X51 STRL (DRAPES) ×2 IMPLANT
DRSG ADAPTIC 3X8 NADH LF (GAUZE/BANDAGES/DRESSINGS) ×2 IMPLANT
DRSG PAD ABDOMINAL 8X10 ST (GAUZE/BANDAGES/DRESSINGS) ×2 IMPLANT
DURAPREP 26ML APPLICATOR (WOUND CARE) ×2 IMPLANT
ELECT REM PT RETURN 9FT ADLT (ELECTROSURGICAL) ×2
ELECTRODE REM PT RTRN 9FT ADLT (ELECTROSURGICAL) ×1 IMPLANT
EVACUATOR 1/8 PVC DRAIN (DRAIN) ×2 IMPLANT
FACESHIELD LNG OPTICON STERILE (SAFETY) ×2 IMPLANT
FLOSEAL 10ML (HEMOSTASIS) IMPLANT
GLOVE BIOGEL PI IND STRL 6.5 (GLOVE) ×1 IMPLANT
GLOVE BIOGEL PI IND STRL 7.0 (GLOVE) ×1 IMPLANT
GLOVE BIOGEL PI IND STRL 8 (GLOVE) ×2 IMPLANT
GLOVE BIOGEL PI INDICATOR 6.5 (GLOVE) ×1
GLOVE BIOGEL PI INDICATOR 7.0 (GLOVE) ×1
GLOVE BIOGEL PI INDICATOR 8 (GLOVE) ×2
GLOVE ORTHO TXT STRL SZ7.5 (GLOVE) ×2 IMPLANT
GLOVE SURG ORTHO 8.0 STRL STRW (GLOVE) ×2 IMPLANT
GLOVE SURG SS PI 6.5 STRL IVOR (GLOVE) ×2 IMPLANT
GLOVE SURG SS PI 7.5 STRL IVOR (GLOVE) ×4 IMPLANT
GOWN PREVENTION PLUS XLARGE (GOWN DISPOSABLE) ×2 IMPLANT
GOWN PREVENTION PLUS XXLARGE (GOWN DISPOSABLE) ×2 IMPLANT
GOWN STRL NON-REIN LRG LVL3 (GOWN DISPOSABLE) ×4 IMPLANT
HANDPIECE INTERPULSE COAX TIP (DISPOSABLE) ×1
HOOD PEEL AWAY FACE SHEILD DIS (HOOD) ×2 IMPLANT
IMMOBILIZER KNEE 20 (SOFTGOODS) ×2
IMMOBILIZER KNEE 20 THIGH 36 (SOFTGOODS) ×1 IMPLANT
IMMOBILIZER KNEE 22 UNIV (SOFTGOODS) ×2 IMPLANT
KIT BASIN OR (CUSTOM PROCEDURE TRAY) ×2 IMPLANT
KIT ROOM TURNOVER OR (KITS) ×2 IMPLANT
MANIFOLD NEPTUNE II (INSTRUMENTS) ×2 IMPLANT
NEEDLE 22X1 1/2 (OR ONLY) (NEEDLE) IMPLANT
NS IRRIG 1000ML POUR BTL (IV SOLUTION) ×2 IMPLANT
PACK TOTAL JOINT (CUSTOM PROCEDURE TRAY) ×2 IMPLANT
PAD ARMBOARD 7.5X6 YLW CONV (MISCELLANEOUS) ×4 IMPLANT
PAD CAST 4YDX4 CTTN HI CHSV (CAST SUPPLIES) ×1 IMPLANT
PADDING CAST COTTON 4X4 STRL (CAST SUPPLIES) ×1
PADDING CAST COTTON 6X4 STRL (CAST SUPPLIES) ×2 IMPLANT
SET HNDPC FAN SPRY TIP SCT (DISPOSABLE) ×1 IMPLANT
SPONGE GAUZE 4X4 12PLY (GAUZE/BANDAGES/DRESSINGS) ×2 IMPLANT
STAPLER VISISTAT 35W (STAPLE) ×2 IMPLANT
SUCTION FRAZIER TIP 10 FR DISP (SUCTIONS) ×2 IMPLANT
SUT ETHIBOND NAB CT1 #1 30IN (SUTURE) ×4 IMPLANT
SUT VIC AB 0 CT1 27 (SUTURE) ×2
SUT VIC AB 0 CT1 27XBRD ANBCTR (SUTURE) ×2 IMPLANT
SUT VIC AB 2-0 CT1 27 (SUTURE) ×2
SUT VIC AB 2-0 CT1 TAPERPNT 27 (SUTURE) ×2 IMPLANT
SUT VLOC 180 0 24IN GS25 (SUTURE) ×2 IMPLANT
SYR CONTROL 10ML LL (SYRINGE) IMPLANT
TOWEL OR 17X24 6PK STRL BLUE (TOWEL DISPOSABLE) ×2 IMPLANT
TOWEL OR 17X26 10 PK STRL BLUE (TOWEL DISPOSABLE) ×2 IMPLANT
TRAY FOLEY CATH 14FR (SET/KITS/TRAYS/PACK) ×2 IMPLANT
WATER STERILE IRR 1000ML POUR (IV SOLUTION) ×4 IMPLANT

## 2011-09-22 NOTE — Anesthesia Preprocedure Evaluation (Addendum)
Anesthesia Evaluation  Patient identified by MRN, date of birth, ID band Patient awake    Reviewed: Allergy & Precautions, H&P , NPO status , Patient's Chart, lab work & pertinent test results  Airway Mallampati: II TM Distance: >3 FB Neck ROM: full    Dental  (+) Teeth Intact   Pulmonary          Cardiovascular hypertension, On Medications Rhythm:regular Rate:Normal     Neuro/Psych    GI/Hepatic   Endo/Other  Hypothyroidism   Renal/GU      Musculoskeletal   Abdominal   Peds  Hematology   Anesthesia Other Findings   Reproductive/Obstetrics                           Anesthesia Physical Anesthesia Plan  ASA: II  Anesthesia Plan: General   Post-op Pain Management: MAC Combined w/ Regional for Post-op pain   Induction: Intravenous  Airway Management Planned: LMA  Additional Equipment:   Intra-op Plan:   Post-operative Plan: Extubation in OR  Informed Consent: I have reviewed the patients History and Physical, chart, labs and discussed the procedure including the risks, benefits and alternatives for the proposed anesthesia with the patient or authorized representative who has indicated his/her understanding and acceptance.   Dental Advisory Given  Plan Discussed with: CRNA and Surgeon  Anesthesia Plan Comments:        Anesthesia Quick Evaluation

## 2011-09-22 NOTE — Preoperative (Signed)
Beta Blockers   Reason not to administer Beta Blockers:Not Applicable 

## 2011-09-22 NOTE — Anesthesia Postprocedure Evaluation (Signed)
Anesthesia Post Note  Patient: Meredith Rose  Procedure(s) Performed: Procedure(s) (LRB): TOTAL KNEE ARTHROPLASTY (Right)  Anesthesia type: general  Patient location: PACU  Post pain: Pain level controlled  Post assessment: Patient's Cardiovascular Status Stable  Last Vitals:  Filed Vitals:   09/22/11 1036  BP:   Pulse:   Temp: 36.2 C  Resp:     Post vital signs: Reviewed and stable  Level of consciousness: sedated  Complications: No apparent anesthesia complications

## 2011-09-22 NOTE — Progress Notes (Signed)
Utilization Review Completed.,  T8/23/2013   

## 2011-09-22 NOTE — Interval H&P Note (Signed)
History and Physical Interval Note:  09/22/2011 7:36 AM  Meredith Rose  has presented today for surgery, with the diagnosis of OA OF RIGHT KNEE  The various methods of treatment have been discussed with the patient and family. After consideration of risks, benefits and other options for treatment, the patient has consented to  Procedure(s) (LRB): TOTAL KNEE ARTHROPLASTY (Right) as a surgical intervention .  The patient's history has been reviewed, patient examined, no change in status, stable for surgery.  I have reviewed the patient's chart and labs.  Questions were answered to the patient's satisfaction.      JR,W D

## 2011-09-22 NOTE — Transfer of Care (Signed)
Immediate Anesthesia Transfer of Care Note  Patient: Meredith Rose  Procedure(s) Performed: Procedure(s) (LRB): TOTAL KNEE ARTHROPLASTY (Right)  Patient Location: PACU  Anesthesia Type: General  Level of Consciousness: awake, alert  and oriented  Airway & Oxygen Therapy: Patient Spontanous Breathing and Patient connected to nasal cannula oxygen  Post-op Assessment: Report given to PACU RN, Post -op Vital signs reviewed and stable and Patient moving all extremities  Post vital signs: Reviewed and stable  Complications: No apparent anesthesia complications

## 2011-09-22 NOTE — Progress Notes (Signed)
Orthopedic Tech Progress Note Patient Details:  Meredith Rose Nov 18, 1950 [id number] CPM applied to Right LE, OHF with trapeze applied to bed CPM Right Knee CPM Right Knee: On Right Knee Flexion (Degrees): 60  Right Knee Extension (Degrees): 0    Asia R Thompson 09/22/2011, 11:36 AM

## 2011-09-22 NOTE — Anesthesia Procedure Notes (Addendum)
Anesthesia Regional Block:  Femoral nerve block  Pre-Anesthetic Checklist: ,, timeout performed, Correct Patient, Correct Site, Correct Laterality, Correct Procedure, Correct Position, site marked, Risks and benefits discussed,  Surgical consent,  Pre-op evaluation,  At surgeon's request and post-op pain management  Laterality: Right  Prep: chloraprep       Needles:  Injection technique: Single-shot  Needle Type: Echogenic Stimulator Needle          Additional Needles:  Procedures: ultrasound guided and nerve stimulator Femoral nerve block  Nerve Stimulator or Paresthesia:  Response: 0.4 mA,   Additional Responses:   Narrative:  Start time: 09/22/2011 7:10 AM End time: 09/22/2011 7:20 AM Injection made incrementally with aspirations every 5 mL.  Performed by: Personally  Anesthesiologist: Arta Bruce MD  Additional Notes: Monitors applied. Patient sedated. Sterile prep and drape,hand hygiene and sterile gloves were used. Relevant anatomy identified.Needle position confirmed.Local anesthetic injected incrementally after negative aspiration. Local anesthetic spread visualized around nerve(s). Vascular puncture avoided. No complications. Image printed for medical record.The patient tolerated the procedure well.       Femoral nerve block Procedure Name: LMA Insertion Date/Time: 09/22/2011 7:57 AM Performed by: Rossie Muskrat L Pre-anesthesia Checklist: Patient identified, Timeout performed, Emergency Drugs available, Suction available and Patient being monitored Patient Re-evaluated:Patient Re-evaluated prior to inductionOxygen Delivery Method: Circle system utilized Preoxygenation: Pre-oxygenation with 100% oxygen Intubation Type: IV induction LMA: LMA inserted LMA Size: 4.0 Number of attempts: 1 Placement Confirmation: positive ETCO2 and breath sounds checked- equal and bilateral Tube secured with: Tape Dental Injury: Teeth and Oropharynx as per pre-operative  assessment

## 2011-09-22 NOTE — Op Note (Signed)
Dictated [phone]

## 2011-09-22 NOTE — H&P (View-Only) (Signed)
NAME: Meredith Rose MRN: #[id number] DATE: September 05, 2011 DOB: [birth year]  COMPLAINT:     Follow up right knee osteoarthritis.  HPI:     The patient is a 61 year old female with history of right knee osteoarthritis; also with a history of hypothyroidism, hypertension, anxiety/depression, and benign essential tremor.  She is here today to discuss right knee osteoarthritis and possible total knee arthroplasty.  She has failed conservative treatments with NSAIDs, pain medications, corticosteroid injection, and viscosupplementation.  She had right knee arthroscopy in December of 2012.  Right knee pain is significant affecting her quality of life and activities of daily living.  It does awaken her from sleep at night.  CURRENT MEDICATIONS:    Synthroid 112 mcg daily, losartan/HCTZ 100/25 mg daily, sertraline 100 mg daily, and propranolol 20 mg daily.   She is not currently on any blood thinners.  ALLERGIES:     NKDA.  PAST MEDICAL HISTORY:   Bilateral knee osteoarthritis, hypothyroidism, hypertension, anxiety/depression, benign essential tremor.  HOSPITALIZATIONS:   Ileus status post appendectomy in 2013, childbirth in 1975 and 1979.  PAST SURGICAL HISTORY:     Tubal ligation 2002, hysterectomy for ovarian cancer also in 2002, left knee arthroscopy 2007, right knee arthroscopy 2012, and appendectomy 2013.  ROS:    Ten point review of systems was obtained and positive for glasses/contacts, high blood pressure, and thyroid problems.   FAMILY HISTORY:    Positive for hypertension and stroke in mother and father, hypertension in brother, sister, and grandparents.  SOCIAL HISTORY:    Nonsmoker, nondrinker.  She is married.  She lives with her husband in a one-story residence.  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  EXAM:     The patient is seated in the examination room in no acute distress.  She is alert, oriented, and appears appropriate age.  Height 5 feet, 6 inches.  Weight  210 pounds.  BMI calculated at 33.9.  Vital signs:  Temperature 96.8 degrees.  Pulse 54.  Respirations 18.  Blood pressure 148/75  HEENT:   PERRLA.  No dentures or oral implants. Neck supple with good range of motion. Chest/Lungs:   Normal breath sounds, clear to auscultation bilaterally.   Heart:   Regular rate and rhythm.  No sign of murmur. Abdomen:   Soft, non tender.  Active bowel sounds in all four quadrants. Rectal/Breast:    Not indicated for surgery. Neuro:   Cranial nerves grossly intact. Musculoskeletal:    Examination of bilateral lower extremities shows she is neurovascularly intact.  She has tenderness to palpation medial and lateral joint lines bilaterally, right greater than left.  Positive patellofemoral crepitus.  Denies calf pain with palpation.  Dorsiflexion and plantar flexion of ankles intact.  X-RAYS:     No images were obtained today.  X-rays taken of the right knee on 03/29/11 show a bone-on-bone medial compartment and an end-stage osteoarthritis knee.  IMPRESSION:      1. Bilateral knee osteoarthritis, right greater than left. 2. Hypothyroidism. 3. Hypertension. 4. Anxiety/depression. 5. Benign essential tremor.  RECOMMENDATIONS:     The risks and benefits of right total knee arthroplasty were discussed again today.  The patient wishes to proceed.  She has a preadmission appointment scheduled on 09/19/11.  Surgery is scheduled for 09/22/11.  She does wish to go home following the surgery.  This is currently set up with [facility] home [facility].  She has obtained preoperative medical and cardiac clearance from her primary care  physician, Dr. Valentina Lucks, as well as Dr. Lindie Spruce, the surgeon for her appendectomy.  She is given preoperative instructions. Also discussed postoperative DVT prophylaxis and perioperative antibiotics. She will be given Ancef.  If she has any further questions or concerns prior to surgery she may contact our office.    Josh   P.A.-C/[id number]  Auto-Authenticated by Estanislado Spire P.A.-C

## 2011-09-23 DIAGNOSIS — M1711 Unilateral primary osteoarthritis, right knee: Secondary | ICD-10-CM

## 2011-09-23 DIAGNOSIS — E876 Hypokalemia: Secondary | ICD-10-CM

## 2011-09-23 DIAGNOSIS — I959 Hypotension, unspecified: Secondary | ICD-10-CM

## 2011-09-23 LAB — BASIC METABOLIC PANEL
BUN: 9 mg/dL (ref 6–23)
Creatinine, Ser: 0.62 mg/dL (ref 0.50–1.10)
GFR calc non Af Amer: >90 mL/min (ref >90)
Glucose, Bld: 107 mg/dL — ABNORMAL HIGH (ref 70–99)
Potassium: 3.3 mEq/L — ABNORMAL LOW (ref 3.5–5.1)

## 2011-09-23 LAB — CBC
HCT: 30.8 % — ABNORMAL LOW (ref 36.0–46.0)
Hemoglobin: 10 g/dL — ABNORMAL LOW (ref 12.0–15.0)
MCH: 27.3 pg (ref 26.0–34.0)
MCHC: 32.5 g/dL (ref 30.0–36.0)
MCV: 84.2 fL (ref 78.0–100.0)

## 2011-09-23 NOTE — Progress Notes (Signed)
Physical Therapy Treatment Patient Details Name: Meredith Rose MRN: [id number] DOB: [birth year] Today's Date: 09/23/2011 Time: 1478-2956 PT Time Calculation (min): 19 min  PT Assessment / Plan / Recommendation Comments on Treatment Session  Pt tolerated Rx well.    Follow Up Recommendations  Home health PT    Barriers to Discharge        Equipment Recommendations  None recommended by PT    Recommendations for Other Services    Frequency 7X/week   Plan Discharge plan remains appropriate;Frequency remains appropriate    Precautions / Restrictions Precautions Precautions: Knee Required Braces or Orthoses: Knee Immobilizer - Right Knee Immobilizer - Right: On except when in CPM Restrictions RLE Weight Bearing: Weight bearing as tolerated   Pertinent Vitals/Pain 7/10    Mobility  Bed Mobility Bed Mobility: Sit to Supine Sit to Supine: 4: Min assist Details for Bed Mobility Assistance: assist with RLE Transfers Transfers: Stand Pivot Transfers Sit to Stand: 4: Min assist;With armrests;From chair/3-in-1 Stand to Sit: 4: Min guard;To chair/3-in-1;To bed;With upper extremity assist Stand Pivot Transfers: 4: Min assist;With armrests Details for Transfer Assistance: verbal cues for sequencing Ambulation/Gait Ambulation/Gait Assistance: 4: Min guard Ambulation Distance (Feet): 10 Feet Assistive device: Rolling walker Ambulation/Gait Assistance Details: verbal cues for sequencing Gait Pattern: Step-to pattern;Antalgic    Exercises Total Joint Exercises Ankle Circles/Pumps: AROM;Both;10 reps;Supine Quad Sets: AROM;Right;10 reps;Supine Heel Slides: AAROM;Right;10 reps;Supine Hip ABduction/ADduction: AAROM;Right;10 reps;Supine   PT Diagnosis:    PT Problem List:   PT Treatment Interventions:     PT Goals Acute Rehab PT Goals PT Goal: Supine/Side to Sit - Progress: Goal set today PT Goal: Sit to Supine/Side - Progress: Goal set today PT Goal: Sit to Stand - Progress:  Goal set today PT Goal: Stand to Sit - Progress: Goal set today PT Transfer Goal: Bed to Chair/Chair to Bed - Progress: Goal set today PT Goal: Ambulate - Progress: Goal set today PT Goal: Up/Down Stairs - Progress: Goal set today PT Goal: Perform Home Exercise Program - Progress: Goal set today  Visit Information  Last PT Received On: 09/23/11 Assistance Needed: +1    Subjective Data  Subjective: "I am ready to get in the bed." Patient Stated Goal: home   Cognition  Overall Cognitive Status: Appears within functional limits for tasks assessed/performed Arousal/Alertness: Awake/alert Orientation Level: Oriented X4 / Intact Behavior During Session: [facility] for tasks performed    Balance     End of Session PT - End of Session Equipment Utilized During Treatment: Gait belt;Right knee immobilizer Activity Tolerance: Patient tolerated treatment well Patient left: in bed;with call bell/phone within reach CPM Right Knee CPM Right Knee: On Right Knee Flexion (Degrees): 60  Right Knee Extension (Degrees): 0    GP     Ilda Foil 09/23/2011, 2:48 PM  Aida Raider, PT  Office # [phone] Pager [phone]

## 2011-09-23 NOTE — Progress Notes (Signed)
[facility] (OTS)  Subjective: 1 Day Post-Op Procedure(s) (LRB): TOTAL KNEE ARTHROPLASTY (Right)  Doing well Throat a little sore but does want cephacol lozenge  No CP, No SOB Tolerating diet No numbness or tingling No acute issues of note Denies lightheadedness  Objective: Current Vitals Blood pressure 108/56, pulse 64, temperature 99 F (37.2 C), temperature source Oral, resp. rate 18, height 5\' 6"  (1.676 m), weight 93.895 kg (207 lb), SpO2 97.00%. Vital signs in last 24 hours: Temp:  [97.1 F (36.2 C)-99 F (37.2 C)] 99 F (37.2 C) (08/24 0543) Pulse Rate:  [55-80] 64  (08/24 0543) Resp:  [8-18] 18  (08/24 0543) BP: (108-175)/(50-87) 108/56 mmHg (08/24 0543) SpO2:  [92 %-100 %] 97 % (08/24 0543) FiO2 (%):  [2 %] 2 % (08/23 1330) Weight:  [93.895 kg (207 lb)] 93.895 kg (207 lb) (08/23 1330)  Intake/Output from previous day: 08/23 0701 - 08/24 0700 In: 3800 [P.O.:1200; I.V.:2600] Out: 3000 [Urine:2475; Drains:450; Blood:75] Intake/Output      08/23 0701 - 08/24 0700 08/24 0701 - 08/25 0700   P.O. 1200    I.V. (mL/kg) 2600 (27.7)    Total Intake(mL/kg) 3800 (40.5)    Urine (mL/kg/hr) 2475 (1.1)    Drains 450    Blood 75    Total Output 3000    Net +800           LABS  Basename 09/23/11 0515  HGB 10.0*    Basename 09/23/11 0515  WBC 9.6  RBC 3.66*  HCT 30.8*  PLT 242    Basename 09/23/11 0515  NA 141  K 3.3*  CL 103  CO2 28  BUN 9  CREATININE 0.62  GLUCOSE 107*  CALCIUM 8.8   No results found for this basename: LABPT:2,INR:2 in the last 72 hours   Physical Exam  Gen: NAD, resting comfortably in chair, reading newspaper Lungs:Clear anteriorly, occassional crackles on L lower Cardiac:reg, s1 and s2 Abd:+ BS Ext: Right Lower Extremity  Dressing c/d/i  hemovac patent  DPN/SPN/TN sensation intact  EHL, FHL, AT, PT, peroneals, gastroc motor intact  + Quad set  Swelling stable  + DP pulse    Imaging X-ray Knee Right  Port  09/22/2011  *RADIOLOGY REPORT*  Clinical Data: Postop right total knee arthroplasty.  PORTABLE RIGHT KNEE - 1-2 VIEW  Comparison: MRI 12/24/2010.  Findings: The patient has undergone interval right total knee arthroplasty.  The hardware appears well positioned.  A surgical drain is in place.  A small amount of air is present within the joint and soft tissues surrounding the knee.  There is no evidence of acute fracture or subluxation.  IMPRESSION: No demonstrated complication related to right total knee arthroplasty.   Original Report Authenticated By: Gerrianne Scale, M.D.     Assessment/Plan: 1 Day Post-Op Procedure(s) (LRB): TOTAL KNEE ARTHROPLASTY (Right)  61 y/o female s/p R TKA  1. R knee endstage DJD s/p R TKA POD #1  WBAT with therapies  Continue with KI  PT/OT  Dressing change tomorrow, d/c hemovac tomorrow  Continue with ice and elevation  2. Hypotension  Will d/c HTN meds  Continue with gentle IV hydration  Check CBC in am and monitor blood loss  Continue to monitor 3. Hypothyroidism  Home meds 4. FEN  Increase IVF to 100cc/hr  Hypokalemia- monitor, check bmet in am 5. DVT/PE prophylaxis  Lovenox  Pt with CA hx so at increased risk 6. Pain  Continue with current regimen 7. Dispo  Continue with therapies  Likely to d/c home on Monday with HH    Mearl Latin, PA-C [facility] [phone] (P) 09/23/2011, 10:15 AM

## 2011-09-23 NOTE — Op Note (Signed)
NAME:  Meredith Rose, GRAYER NO.:  [id number]  MEDICAL RECORD NO.:  [id number]  LOCATION:  [id number]                        FACILITY:  [facility]  PHYSICIAN:  Dyke Brackett, M.D.    DATE OF BIRTH:  07-29-1950  DATE OF PROCEDURE:  09/22/2011 DATE OF DISCHARGE:                              OPERATIVE REPORT   INDICATIONS:  This is a 61 year old contractible right knee pain from severe osteoarthritis, thought to be amenable to hospitalization for total knee replacement.  PREOPERATIVE DIAGNOSIS:  Osteoarthritis right knee with mild varus deformity.  POSTOPERATIVE DIAGNOSIS:  Osteoarthritis right knee with mild varus deformity.  OPERATION:  Right total knee replacement (Sigma cemented DePuy knee, size 2.5 femur, 3 tibia, 10 mm bearing, 35 mm all poly patella).  SURGEON:  Dyke Brackett, M.D.  ASSISTANT:  Margart Sickles, PA-C.  TOURNIQUET TIME:  70 minutes.  DESCRIPTION OF PROCEDURE:  Sterile prep and drape, exsanguination of leg, inflation to 350.  Straight skin incision was made with medial parapatellar approach made to the knee, identified the most diseased medial compartment.  We cut the femur provisionally a 10 and an additional 1 mm to obtain full extension, for a total of 11-mm distal femoral resection and cut about 2-3 mm below the most diseased medial compartment.  Extension gap was measured at 10 mm with slight hyperextension noted.  Initially, we sized 3 and then downsized due to the fact that once we put the block on for 3 relative to the shin.  We thought the flexion gap would be too tight.  Therefore, we downsized to increase the flexion gap, which remained in this situation nicely.  Then applied the pins for the 2.5-mm cutting block and set the rotation from intramedullary guide and placed the all in one cutting block and cut the anterior-posterior femur chamfers measured the flexion gap at 10 and equal to the extension gap.  Tibia was prepared with a 3  size 3 tibia with a keel cut being made for standard tibial bearing and standard tibial prosthesis.  Posterior aspect of the knee was cleared of any excess free osteophytes as well as complete release of the PCL.  The box cut was made from the femur.  We then trialled tibia and femur with again excellent cuts noted.  Full extension, good balance, and no tendency for bearing spinout.  Patella was cut leaving about 15 mm of native patella with moderate insufficiency noted on the lateral side of the patella.  Medial aspect of patella just a bit, we were able to get all bony surfaces.  We then trialled all components and all parameters, which were mentioned above were deemed to be acceptable.  Trial components were removed.  The bony surfaces was copiously irrigated.  We then inserted the tibia followed by the femur patella to cement in a doughy state.  I elected to use a trial bearing, which allowed the cement to harden.  Once this was done, we removed the trial bearing and checked for any excess cement in the posterior aspect of the knee.  Then, the tourniquet was released.  No excessive bleeding was noted.  We coagulated small bleeders.  Then placed the final bearing  and checked all parameters with motion stability and Excedrin, which again to be deemed to be acceptable.  Hemovac drain was placed superolaterally and we closed the capsule with interrupted #1 Ethibond and 2-0 Vicryl skin clips and taken recovery in stable condition after knee immobilizer while applied.     Dyke Brackett, M.D.     WDC/MEDQ  D:  09/22/2011  T:  09/23/2011  Job:  [id number]

## 2011-09-23 NOTE — Evaluation (Signed)
Physical Therapy Evaluation Patient Details Name: Meredith Rose MRN: [id number] DOB: 1950-09-10 Today's Date: 09/23/2011 Time: 5621-3086 PT Time Calculation (min): 16 min  PT Assessment / Plan / Recommendation Clinical Impression  Pt underwent R TKA 09-22-11.  Acute PT indicated to increase ROM/strength RLE, increase mobility/gait distance, and provide education for safe d/c home with family.  She has HHPT services arranged.  She has family assist available 24 hr/day.    PT Assessment  Patient needs continued PT services    Follow Up Recommendations  Home health PT    Barriers to Discharge None      Equipment Recommendations  None recommended by PT    Recommendations for Other Services OT consult   Frequency 7X/week    Precautions / Restrictions Precautions Precautions: Knee Required Braces or Orthoses: Knee Immobilizer - Right Knee Immobilizer - Right: On except when in CPM Restrictions Weight Bearing Restrictions: Yes RLE Weight Bearing: Weight bearing as tolerated   Pertinent Vitals/Pain 3/10      Mobility  Bed Mobility Bed Mobility: Supine to Sit Supine to Sit: 4: Min assist;HOB elevated;With rails Details for Bed Mobility Assistance: verbal cues for sequencing Transfers Transfers: Sit to Stand;Stand to Sit Sit to Stand: 3: Mod assist;From bed;With upper extremity assist Stand to Sit: 4: Min assist;To chair/3-in-1;With armrests Details for Transfer Assistance: verbal cues for sequencing Ambulation/Gait Ambulation/Gait Assistance: 4: Min assist Ambulation Distance (Feet): 7 Feet Assistive device: Rolling walker Ambulation/Gait Assistance Details: verbal/tactile cues for RW management and sequencing Gait Pattern: Step-to pattern;Decreased stance time - right;Antalgic Gait velocity: decreased    Exercises Total Joint Exercises Ankle Circles/Pumps: AROM;Both;10 reps Goniometric ROM: 3-45 degrees R knee AAROM   PT Diagnosis: Difficulty walking;Acute pain    PT Problem List: Decreased strength;Decreased range of motion;Decreased mobility;Decreased knowledge of use of DME;Pain PT Treatment Interventions: DME instruction;Gait training;Stair training;Therapeutic activities;Therapeutic exercise;Functional mobility training;Patient/family education   PT Goals Acute Rehab PT Goals PT Goal Formulation: With patient Time For Goal Achievement: 09/30/11 Potential to Achieve Goals: Good Pt will go Supine/Side to Sit: with modified independence;with HOB 0 degrees PT Goal: Supine/Side to Sit - Progress: Goal set today Pt will go Sit to Supine/Side: with modified independence;with HOB 0 degrees PT Goal: Sit to Supine/Side - Progress: Goal set today Pt will go Sit to Stand: with modified independence;with upper extremity assist PT Goal: Sit to Stand - Progress: Goal set today Pt will go Stand to Sit: with modified independence;with upper extremity assist PT Goal: Stand to Sit - Progress: Goal set today Pt will Transfer Bed to Chair/Chair to Bed: with modified independence PT Transfer Goal: Bed to Chair/Chair to Bed - Progress: Goal set today Pt will Ambulate: >150 feet;with supervision;with rolling walker PT Goal: Ambulate - Progress: Goal set today Pt will Go Up / Down Stairs: 1-2 stairs;with min assist;with rolling walker PT Goal: Up/Down Stairs - Progress: Goal set today Pt will Perform Home Exercise Program: Independently PT Goal: Perform Home Exercise Program - Progress: Goal set today  Visit Information  Last PT Received On: 09/23/11 Assistance Needed: +1    Subjective Data  Subjective: I am feeling groggy. Patient Stated Goal: home   Prior Functioning  Home Living Lives With: Spouse Available Help at Discharge: Family Type of Home: House Home Access: Stairs to enter Secretary/administrator of Steps: 1 Entrance Stairs-Rails: None Home Layout: One level Home Adaptive Equipment: Walker - rolling;Bedside commode/3-in-1 Prior  Function Level of Independence: Independent Able to Take Stairs?: Yes Driving: Yes Communication Communication:  No difficulties    Cognition  Overall Cognitive Status: Appears within functional limits for tasks assessed/performed Arousal/Alertness: Awake/alert Orientation Level: Oriented X4 / Intact Behavior During Session: [facility] for tasks performed    Extremity/Trunk Assessment     Balance    End of Session PT - End of Session Equipment Utilized During Treatment: Gait belt;Right knee immobilizer Activity Tolerance: Patient tolerated treatment well Patient left: in chair;with call bell/phone within reach CPM Right Knee CPM Right Knee: Off Right Knee Flexion (Degrees): 60  Right Knee Extension (Degrees): 0   GP     Ilda Foil 09/23/2011, 9:27 AM  Aida Raider, PT  Office # [phone] Pager [phone]

## 2011-09-24 LAB — BASIC METABOLIC PANEL
BUN: 7 mg/dL (ref 6–23)
BUN: 9 mg/dL (ref 6–23)
CO2: 35 mEq/L — ABNORMAL HIGH (ref 19–32)
Calcium: 9.1 mg/dL (ref 8.4–10.5)
Calcium: 9.3 mg/dL (ref 8.4–10.5)
Chloride: 98 mEq/L (ref 96–112)
Creatinine, Ser: 0.62 mg/dL (ref 0.50–1.10)
GFR calc Af Amer: >90 mL/min (ref >90)
GFR calc non Af Amer: >90 mL/min (ref >90)
GFR calc non Af Amer: >90 mL/min (ref >90)
Glucose, Bld: 123 mg/dL — ABNORMAL HIGH (ref 70–99)
Glucose, Bld: 104 mg/dL — ABNORMAL HIGH (ref 70–99)
Potassium: 3.8 mEq/L (ref 3.5–5.1)
Sodium: 141 mEq/L (ref 135–145)
Sodium: 139 mEq/L (ref 135–145)

## 2011-09-24 LAB — CBC
Hemoglobin: 9.5 g/dL — ABNORMAL LOW (ref 12.0–15.0)
MCH: 26.7 pg (ref 26.0–34.0)
MCHC: 31.6 g/dL (ref 30.0–36.0)
RDW: 14.2 % (ref 11.5–15.5)

## 2011-09-24 MED ORDER — POTASSIUM CHLORIDE CRYS ER 20 MEQ PO TBCR
40.0000 meq | EXTENDED_RELEASE_TABLET | Freq: Once | ORAL | Status: AC
  Administered 2011-09-24: 40 meq via ORAL
  Filled 2011-09-24: qty 2

## 2011-09-24 MED ORDER — POTASSIUM CHLORIDE CRYS ER 20 MEQ PO TBCR
20.0000 meq | EXTENDED_RELEASE_TABLET | Freq: Three times a day (TID) | ORAL | Status: DC
  Administered 2011-09-24 – 2011-09-25 (×4): 20 meq via ORAL
  Filled 2011-09-24 (×6): qty 1

## 2011-09-24 NOTE — Progress Notes (Signed)
IV saline locked. Patient has been taking in adequate PO and has positive urine output.

## 2011-09-24 NOTE — Progress Notes (Signed)
O2 sats low 80's while pt asleep. After deep breathing sats increase to low 90's. O2 nasal cannula at 2l  placed at hs and incentive spirometer started. Sats increased to 95%. Pt very cooperative with using IS and deep breathing and coughing.

## 2011-09-24 NOTE — Progress Notes (Signed)
Physical Therapy Treatment Patient Details Name: Meredith Rose MRN: [id number] DOB: 1950/07/03 Today's Date: 09/24/2011 Time: 4098-1191 PT Time Calculation (min): 15 min  PT Assessment / Plan / Recommendation Comments on Treatment Session  Increased gait distance today.    Follow Up Recommendations  Home health PT    Barriers to Discharge        Equipment Recommendations  None recommended by PT    Recommendations for Other Services    Frequency 7X/week   Plan Discharge plan remains appropriate;Frequency remains appropriate    Precautions / Restrictions Precautions Required Braces or Orthoses: Knee Immobilizer - Right Knee Immobilizer - Right: On except when in CPM Restrictions Weight Bearing Restrictions: Yes RLE Weight Bearing: Weight bearing as tolerated   Pertinent Vitals/Pain 3/10    Mobility  Bed Mobility Supine to Sit: 4: Min assist Transfers Sit to Stand: 4: Min guard;With upper extremity assist;From bed Stand to Sit: 4: Min guard;With armrests;To chair/3-in-1 Details for Transfer Assistance: verbal cues for sequencing, hand placement Ambulation/Gait Ambulation/Gait Assistance: 5: Supervision Ambulation Distance (Feet): 120 Feet Assistive device: Rolling walker Ambulation/Gait Assistance Details: verbal cues for RW management Gait Pattern: Step-to pattern;Antalgic Gait velocity: decreased    Exercises     PT Diagnosis:    PT Problem List:   PT Treatment Interventions:     PT Goals Acute Rehab PT Goals PT Goal: Supine/Side to Sit - Progress: Progressing toward goal PT Goal: Sit to Supine/Side - Progress: Progressing toward goal PT Goal: Sit to Stand - Progress: Progressing toward goal PT Goal: Stand to Sit - Progress: Progressing toward goal PT Transfer Goal: Bed to Chair/Chair to Bed - Progress: Progressing toward goal PT Goal: Ambulate - Progress: Progressing toward goal PT Goal: Up/Down Stairs - Progress: Progressing toward goal PT Goal:  Perform Home Exercise Program - Progress: Progressing toward goal  Visit Information  Last PT Received On: 09/24/11 Assistance Needed: +1    Subjective Data  Subjective: "My head is foggy from the medicine." Patient Stated Goal: home   Cognition  Overall Cognitive Status: Appears within functional limits for tasks assessed/performed Arousal/Alertness: Awake/alert Orientation Level: Appears intact for tasks assessed Behavior During Session: [facility] for tasks performed    Balance     End of Session PT - End of Session Equipment Utilized During Treatment: Gait belt;Right knee immobilizer Activity Tolerance: Patient tolerated treatment well Patient left: in chair;with nursing in room Nurse Communication: Mobility status CPM Right Knee CPM Right Knee: Off Right Knee Flexion (Degrees): 60  Right Knee Extension (Degrees): 0    GP     Ilda Foil 09/24/2011, 11:21 AM  Aida Raider, PT  Office # [phone] Pager [phone]

## 2011-09-24 NOTE — Progress Notes (Signed)
[facility] (OTS)  Subjective: 2 Days Post-Op Procedure(s) (LRB): TOTAL KNEE ARTHROPLASTY (Right)   Doing well this am Just finished working with therapy No acute issues No SOB, No CP Denies fever or chills Low O2 sats noted overnight (87 on RA, back up to mid 90's with 2L  Objective: Current Vitals Blood pressure 110/50, pulse 75, temperature 98.4 F (36.9 C), temperature source Oral, resp. rate 16, height 5\' 6"  (1.676 m), weight 93.895 kg (207 lb), SpO2 97.00%. Vital signs in last 24 hours: Temp:  [98 F (36.7 C)-100.5 F (38.1 C)] 98.4 F (36.9 C) (08/25 0522) Pulse Rate:  [61-75] 75  (08/25 0522) Resp:  [16-18] 16  (08/25 0522) BP: (110-114)/(50-60) 110/50 mmHg (08/25 0522) SpO2:  [87 %-97 %] 97 % (08/25 0522)  Intake/Output from previous day: 08/24 0701 - 08/25 0700 In: 480 [P.O.:480] Out: 245 [Urine:30; Drains:215] Intake/Output      08/24 0701 - 08/25 0700 08/25 0701 - 08/26 0700   P.O. 480    I.V. (mL/kg)     Total Intake(mL/kg) 480 (5.1)    Urine (mL/kg/hr) 30 (0)    Drains 215    Blood     Total Output 245    Net +235         Urine Occurrence 1 x       LABS  Basename 09/24/11 0535 09/23/11 0515  HGB 9.5* 10.0*    Basename 09/24/11 0535 09/23/11 0515  WBC 10.5 9.6  RBC 3.56* 3.66*  HCT 30.1* 30.8*  PLT 235 242    Basename 09/24/11 0535 09/23/11 0515  NA 141 141  K 2.9* 3.3*  CL 101 103  CO2 35* 28  BUN 7 9  CREATININE 0.67 0.62  GLUCOSE 123* 107*  CALCIUM 9.1 8.8   No results found for this basename: LABPT:2,INR:2 in the last 72 hours   Physical Exam  Gen: NAD, sitting in chair Lungs:Clear lung fields, no resp distress Cardiac:reg, s1 and s2 Abd: + BS, NT Ext:      Right Lower Extremity  Wound looks great  No drainage  DPN, SPN, TN sensation intact  EHL, FHL, AT, PT, peroneals and gastroc motor intact  + Quad set  Swelling stable  + DP pulse  No DCT  Compartments soft    Imaging X-ray Knee Right  Port  09/22/2011  *RADIOLOGY REPORT*  Clinical Data: Postop right total knee arthroplasty.  PORTABLE RIGHT KNEE - 1-2 VIEW  Comparison: MRI 12/24/2010.  Findings: The patient has undergone interval right total knee arthroplasty.  The hardware appears well positioned.  A surgical drain is in place.  A small amount of air is present within the joint and soft tissues surrounding the knee.  There is no evidence of acute fracture or subluxation.  IMPRESSION: No demonstrated complication related to right total knee arthroplasty.   Original Report Authenticated By: Gerrianne Scale, M.D.     Assessment/Plan: 2 Days Post-Op Procedure(s) (LRB): TOTAL KNEE ARTHROPLASTY (Right)  61 y/o female s/p R TKA   1. 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 Hypotension   Improved, continue to hold HTN meds   CBC stable, check in am  monitor  3. Hypothyroidism   Home meds  4. FEN   kvo ivf   Hypokalemia- repleate  5. DVT/PE prophylaxis   Lovenox  Pt with CA hx so at increased risk  6. Pain   Continue with current regimen  7. Dispo   Continue with therapies   Likely to d/c home on Monday with HH  Mearl Latin, PA-C [facility] [phone] (P) 09/24/2011, 9:54 AM

## 2011-09-24 NOTE — Progress Notes (Signed)
Physical Therapy Treatment Patient Details Name: Meredith Rose MRN: [id number] DOB: [birth year] Today's Date: 09/24/2011 Time: 9562-1308 PT Time Calculation (min): 29 min  PT Assessment / Plan / Recommendation Comments on Treatment Session  Pt progressing well.    Follow Up Recommendations  Home health PT    Barriers to Discharge        Equipment Recommendations  None recommended by PT    Recommendations for Other Services    Frequency 7X/week   Plan Discharge plan remains appropriate;Frequency remains appropriate    Precautions / Restrictions Precautions Precautions: Knee Required Braces or Orthoses: Knee Immobilizer - Right Knee Immobilizer - Right: On except when in CPM Restrictions RLE Weight Bearing: Weight bearing as tolerated   Pertinent Vitals/Pain 3/10    Mobility  Bed Mobility Sit to Supine: 4: Min guard;HOB flat Transfers Sit to Stand: 4: Min guard;From chair/3-in-1;With armrests Stand to Sit: 5: Supervision;To bed;With upper extremity assist Details for Transfer Assistance: verbal cues for sequencing, hand placement Ambulation/Gait Ambulation/Gait Assistance: 5: Supervision Ambulation Distance (Feet): 120 Feet Assistive device: Rolling walker Ambulation/Gait Assistance Details: verbal cues for RW management Gait Pattern: Step-to pattern;Antalgic Gait velocity: decreased    Exercises Total Joint Exercises Ankle Circles/Pumps: AROM;Both;10 reps;Supine Quad Sets: AROM;Right;10 reps;Supine Heel Slides: AROM;Right;10 reps;Supine Hip ABduction/ADduction: AAROM;Right;10 reps;Supine   PT Diagnosis:    PT Problem List:   PT Treatment Interventions:     PT Goals Acute Rehab PT Goals PT Goal: Supine/Side to Sit - Progress: Progressing toward goal PT Goal: Sit to Supine/Side - Progress: Progressing toward goal PT Goal: Sit to Stand - Progress: Progressing toward goal PT Goal: Stand to Sit - Progress: Progressing toward goal PT Transfer Goal: Bed to  Chair/Chair to Bed - Progress: Progressing toward goal PT Goal: Ambulate - Progress: Progressing toward goal PT Goal: Up/Down Stairs - Progress: Progressing toward goal PT Goal: Perform Home Exercise Program - Progress: Progressing toward goal  Visit Information  Last PT Received On: 09/24/11 Assistance Needed: +1    Subjective Data  Subjective: "I would rather be in a little pain than be foggy from the medicine." Patient Stated Goal: home   Cognition  Overall Cognitive Status: Appears within functional limits for tasks assessed/performed Arousal/Alertness: Awake/alert Orientation Level: Appears intact for tasks assessed Behavior During Session: [facility] for tasks performed    Balance     End of Session PT - End of Session Equipment Utilized During Treatment: Gait belt;Right knee immobilizer Activity Tolerance: Patient tolerated treatment well Patient left: in bed;in CPM Nurse Communication: Mobility status CPM Right Knee CPM Right Knee: On Right Knee Flexion (Degrees): 65  Right Knee Extension (Degrees): 0    GP     Ilda Foil 09/24/2011, 3:40 PM  Aida Raider, PT  Office # [phone] Pager [phone]

## 2011-09-25 LAB — BASIC METABOLIC PANEL
BUN: 8 mg/dL (ref 6–23)
CO2: 32 mEq/L (ref 19–32)
Chloride: 104 mEq/L (ref 96–112)
Glucose, Bld: 97 mg/dL (ref 70–99)
Potassium: 3.9 mEq/L (ref 3.5–5.1)
Sodium: 143 mEq/L (ref 135–145)

## 2011-09-25 LAB — CBC
HCT: 29.8 % — ABNORMAL LOW (ref 36.0–46.0)
Hemoglobin: 9.7 g/dL — ABNORMAL LOW (ref 12.0–15.0)
MCHC: 32.6 g/dL (ref 30.0–36.0)
RBC: 3.49 MIL/uL — ABNORMAL LOW (ref 3.87–5.11)
WBC: 9.8 10*3/uL (ref 4.0–10.5)

## 2011-09-25 MED ORDER — HYDROCODONE-ACETAMINOPHEN 5-325 MG PO TABS: ORAL_TABLET | ORAL | Status: DC

## 2011-09-25 MED ORDER — METHOCARBAMOL 500 MG PO TABS: 500.0000 mg | ORAL_TABLET | Freq: Four times a day (QID) | ORAL | Status: AC | PRN

## 2011-09-25 MED ORDER — ENOXAPARIN SODIUM 30 MG/0.3ML ~~LOC~~ SOLN: 30.0000 mg | Freq: Two times a day (BID) | SUBCUTANEOUS | Status: DC

## 2011-09-25 NOTE — Evaluation (Signed)
Occupational Therapy Evaluation Patient Details Name: Meredith Rose MRN: [id number] DOB: 14-Jul-1950 Today's Date: 09/25/2011 Time: 4098-1191 OT Time Calculation (min): 35 min  OT Assessment / Plan / Recommendation Clinical Impression  Pt is a 61 year old woman s/p R TKA who is progressing well in ADL and mobility.  All education completed.  Recommend return home with assist of her family.  Pt has a RW, 3 in 1, and tub seat with a back already at home.    OT Assessment  Patient does not need any further OT services    Follow Up Recommendations  No OT follow up    Barriers to Discharge      Equipment Recommendations  None recommended by OT    Recommendations for Other Services    Frequency       Precautions / Restrictions Precautions Precautions: Knee Required Braces or Orthoses: Knee Immobilizer - Right Knee Immobilizer - Right: On except when in CPM Restrictions Weight Bearing Restrictions: Yes RLE Weight Bearing: Weight bearing as tolerated   Pertinent Vitals/Pain     ADL  Eating/Feeding: Performed;Independent Where Assessed - Eating/Feeding: Chair Grooming: Performed;Wash/dry hands;Teeth care;Brushing hair;Supervision/safety Where Assessed - Grooming: Unsupported standing Upper Body Bathing: Simulated;Set up Where Assessed - Upper Body Bathing: Unsupported sitting Lower Body Bathing: Simulated;Supervision/safety Where Assessed - Lower Body Bathing: Supported sit to stand Upper Body Dressing: Performed;Set up Where Assessed - Upper Body Dressing: Unsupported sitting Lower Body Dressing: Performed;Supervision/safety Where Assessed - Lower Body Dressing: Supported sit to stand Toilet Transfer: Research scientist (life sciences) Method: Sit to Barista: Comfort height toilet;Grab bars Toileting - Architect and Hygiene: Performed;Modified independent Where Assessed - Toileting Clothing Manipulation and Hygiene: Sit to  stand from 3-in-1 or toilet Tub/Shower Transfer: Simulated;Minimal assistance Tub/Shower Transfer Method: Anterior-posterior Tub/Shower Transfer Equipment: Shower seat with back Equipment Used: Reacher;Sock aid;Gait belt;Rolling walker;Long-handled sponge;Long-handled shoe horn Transfers/Ambulation Related to ADLs: supervision ADL Comments: Pt educated on availability of AE, options for shower transfer     OT Diagnosis:    OT Problem List:   OT Treatment Interventions:     OT Goals    Visit Information  Last OT Received On: 09/25/11 Assistance Needed: +1    Subjective Data  Subjective: "I can already reach my feet." Patient Stated Goal: Home with husband and family's intermittent help.   Prior Functioning  Vision/Perception  Home Living Lives With: Spouse Available Help at Discharge: Family Type of Home: House Home Access: Stairs to enter Secretary/administrator of Steps: 1 Entrance Stairs-Rails: None Home Layout: One level Bathroom Shower/Tub: Engineer, manufacturing systems: Standard Home Adaptive Equipment: Walker - rolling;Bedside commode/3-in-1;Shower chair with back Prior Function Level of Independence: Independent Able to Take Stairs?: Yes Driving: Yes Communication Communication: No difficulties Dominant Hand: Right      Cognition  Overall Cognitive Status: Appears within functional limits for tasks assessed/performed Arousal/Alertness: Awake/alert Orientation Level: Appears intact for tasks assessed Behavior During Session: [facility] for tasks performed    Extremity/Trunk Assessment Right Upper Extremity Assessment RUE ROM/Strength/Tone: Within functional levels Left Upper Extremity Assessment LUE ROM/Strength/Tone: Within functional levels Trunk Assessment Trunk Assessment: Normal   Mobility Bed Mobility Bed Mobility: Sit to Supine Supine to Sit: 6: Modified independent (Device/Increase time);HOB flat Transfers Sit to Stand: 5: Supervision;From  chair/3-in-1;From toilet;With upper extremity assist Stand to Sit: 5: Supervision;To bed;To toilet   Exercise    Balance    End of Session OT - End of Session Activity Tolerance: Patient tolerated treatment well Patient left:  in bed;with call bell/phone within reach Nurse Communication: Other (comment) (needs assist for CPM, pt with mouth ulcers)  GO     Evern Bio 09/25/2011, 9:21 AM [phone]

## 2011-09-25 NOTE — Discharge Summary (Signed)
[address] [place], [place], [place]  [address]                             [phone]  PATIENT ID: [name]        MRN:  [id number]          DOB/AGE: September 12, 1950 / 61 y.o.    DISCHARGE SUMMARY  ADMISSION DATE:    09/22/2011 DISCHARGE DATE:   09/25/2011   ADMISSION DIAGNOSIS: OA OF RIGHT KNEE    DISCHARGE DIAGNOSIS:  OA OF RIGHT KNEE    ADDITIONAL DIAGNOSIS: Principal Problem:  *Right knee DJD Active Problems:  Hypertension  Hypothyroidism  Hypokalemia  Hypotension  Past Medical History  Diagnosis Date  . Anxiety   . Ruptured appendicitis 06/03/11  . Arthritis   . Ovarian cancer 2002  . 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 Hypertension     takes meds daily  . Hypothyroidism     takes meds daily    PROCEDURE: Procedure(s): TOTAL KNEE ARTHROPLASTY on 09/22/2011  CONSULTS:     HISTORY:  See H&P in chart  HOSPITAL COURSE:  Meredith Rose is a 61 y.o. admitted on 09/22/2011 and found to have a diagnosis of OA OF RIGHT KNEE.  After appropriate laboratory studies were obtained  they were taken to the operating room on 09/22/2011 and underwent Procedure(s): TOTAL KNEE ARTHROPLASTY.   They were given perioperative antibiotics:  Anti-infectives     Start     Dose/Rate Route Frequency Ordered Stop   09/22/11 1600   ceFAZolin (ANCEF) IVPB 1 g/50 mL premix        1 g 100 mL/hr over 30 Minutes Intravenous Every 6 hours 09/22/11 1423 09/22/11 2210   09/22/11 0835   cefUROXime (ZINACEF) injection  Status:  Discontinued          As needed 09/22/11 0836 09/22/11 1031   09/21/11 1526   ceFAZolin (ANCEF) IVPB 2 g/50 mL premix        2 g 100 mL/hr over 30 Minutes Intravenous 60 min pre-op 09/21/11 1526 09/22/11 0752        .  Tolerated the procedure well.  Placed with a foley intraoperatively.  Given Ofirmev at induction and for 24 hours.    POD #1, allowed out of bed to a chair.  PT for ambulation and exercise program.  Foley D/C'd in morning.   IV saline locked.  O2 discontionued.  POD #2, continued PT and ambulation.   Hemovac pulled. .Pt found to be hypokalemic which improved with supplementation. Also low O2 sats while sleeping which improved with 2L [place] and incentive spirometer.    The remainder of the hospital course was dedicated to ambulation and strengthening.   The patient was discharged on 3 Days Post-Op in  Stable condition.  Blood products given:none  DIAGNOSTIC STUDIES: Recent vital signs: Patient Vitals for the past 24 hrs:  BP Temp Temp src Pulse Resp SpO2  09/25/11 0523 141/62 mmHg 98.6 F (37 C) Oral 75  18  99 %  10-09-11 2238 147/66 mmHg 100.3 F (37.9 C) Oral 81  20  94 %  10/09/11 1545 123/53 mmHg 98.2 F (36.8 C) Oral 72  18  95 %       Recent laboratory studies:  Basename 09/25/11 0500 2011-10-09 0535 09/23/11 0515 09/19/11 0928  WBC 9.8 10.5 9.6 9.1  HGB 9.7* 9.5* 10.0* 13.5  HCT 29.8* 30.1* 30.8* 39.3  PLT 249 235 242 270    Basename 09/25/11 0500 09/24/11 1550 09/24/11 0535 09/23/11 0515 09/19/11 0928  NA 143 139 141 141 140  K 3.9 3.8 2.9* 3.3* 3.5  CL 104 98 101 103 102  CO2 32 35* 35* 28 26  BUN 8 9 7 9 15   CREATININE 0.57 0.62 0.67 0.62 0.63  GLUCOSE 97 104* 123* 107* 98  CALCIUM 9.4 9.3 9.1 8.8 9.9   Lab Results  Component Value Date   INR 0.98 09/19/2011     Recent Radiographic Studies :  Dg Chest 2 View  09/19/2011  *RADIOLOGY REPORT*  Clinical Data: Preoperative assessment for right total knee arthroplasty, history hypertension, ovarian cancer  CHEST - 2 VIEW  Comparison: 06/06/2011  Findings: Upper-normal size of cardiac silhouette. Mediastinal contours and pulmonary vascularity normal. Minimal chronic atelectasis versus scarring at lingula stable. No acute infiltrate, pleural effusion, or pneumothorax. No acute osseous findings.  IMPRESSION: No acute abnormalities.   Original Report Authenticated By: Lollie Marrow, M.D.    X-ray Knee Right Port  09/22/2011  *RADIOLOGY REPORT*   Clinical Data: Postop right total knee arthroplasty.  PORTABLE RIGHT KNEE - 1-2 VIEW  Comparison: MRI 12/24/2010.  Findings: The patient has undergone interval right total knee arthroplasty.  The hardware appears well positioned.  A surgical drain is in place.  A small amount of air is present within the joint and soft tissues surrounding the knee.  There is no evidence of acute fracture or subluxation.  IMPRESSION: No demonstrated complication related to right total knee arthroplasty.   Original Report Authenticated By: Gerrianne Scale, M.D.     DISCHARGE INSTRUCTIONS:   DISCHARGE MEDICATIONS:   Medication List  As of 09/25/2011  1:19 PM   ASK your doctor about these medications         ibuprofen 200 MG tablet   Commonly known as: ADVIL,MOTRIN   Take 600 mg by mouth daily as needed. For pain      levothyroxine 112 MCG tablet   Commonly known as: SYNTHROID, LEVOTHROID   Take 112 mcg by mouth daily.      losartan-hydrochlorothiazide 100-25 MG per tablet   Commonly known as: HYZAAR   Take 1 tablet by mouth daily.      propranolol 20 MG tablet   Commonly known as: INDERAL   Take 20 mg by mouth daily.      sertraline 100 MG tablet   Commonly known as: ZOLOFT   Take 100 mg by mouth daily.            FOLLOW UP VISIT:   Follow-up Information    Follow up with CAFFREY JR,W D, MD. (call to schedule for 10/05/2011)    Contact information:   [facility] [address] [place], [address] [place] [address] [phone]          DISPOSITION:  Home  With [facility] PT  CONDITION:  Stable   Margart Sickles 09/25/2011, 1:19 PM

## 2011-09-25 NOTE — Clinical Social Work Note (Signed)
Clinical Social Worker received standing order referral for new SNF placement.  Per chart review, patient with no OT follow up needs and home health PT needs.  CM to arrange patient home health needs prior to discharge.  No social work needs identified - inappropriate referral based on standing order.  Macario Golds, LCSW [phone]  (Covering Lovette Cliche [phone])

## 2011-09-25 NOTE — Progress Notes (Signed)
PHYSICAL THERAPY PROGRESS NOTE:   09/25/11 1200  PT Visit Information  Last PT Received On 09/25/11  Assistance Needed +1  PT Time Calculation  PT Start Time 1238  PT Stop Time 1300  PT Time Calculation (min) 22 min  Precautions  Precautions Knee  Required Braces or Orthoses Knee Immobilizer - Right  Knee Immobilizer - Right On except when in CPM  Restrictions  Weight Bearing Restrictions Yes  RLE Weight Bearing WBAT  Cognition  Overall Cognitive Status Appears within functional limits for tasks assessed/performed  Arousal/Alertness Awake/alert  Orientation Level Appears intact for tasks assessed  Behavior During Session [facility] for tasks performed  Bed Mobility  Bed Mobility Not assessed  Transfers  Transfers Sit to Stand;Stand to Sit  Sit to Stand From chair/3-in-1;5: Supervision;With upper extremity assist  Stand to Sit 5: Supervision;With upper extremity assist;To chair/3-in-1  Stand Pivot Transfers 5: Supervision;With armrests  Details for Transfer Assistance verbal cues for sequencing, hand placement  Ambulation/Gait  Ambulation/Gait Assistance 5: Supervision  Ambulation Distance (Feet) 150 Feet  Assistive device Rolling walker  Ambulation/Gait Assistance Details Verbal cueing to increase gait speed.   Gait Pattern Step-through pattern  Stairs Yes  Stairs Assistance 5: Supervision  Stairs Assistance Details (indicate cue type and reason) instructed pt in sequencing and technique to negotiate stairs forward.   Stair Management Technique Forwards;One rail Right  Number of Stairs 1   Wheelchair Mobility  Wheelchair Mobility No  Balance  Balance Assessed No  PT - End of Session  Equipment Utilized During Treatment Gait belt;Right knee immobilizer  Activity Tolerance Patient tolerated treatment well  Patient left in bed;in CPM  Nurse Communication Mobility status  PT - Assessment/Plan  Comments on Treatment Session Pt progressing well.  PT Plan Discharge plan remains  appropriate;Frequency remains appropriate  PT Frequency 7X/week  Recommendations for Other Services OT consult  Follow Up Recommendations Home health PT  Equipment Recommended None recommended by OT  Acute Rehab PT Goals  PT Goal Formulation With patient  Time For Goal Achievement 09/30/11  Potential to Achieve Goals Good  Pt will go Supine/Side to Sit with modified independence;with HOB 0 degrees  PT Goal: Supine/Side to Sit - Progress Progressing toward goal  Pt will go Sit to Supine/Side with modified independence;with HOB 0 degrees  PT Goal: Sit to Supine/Side - Progress Progressing toward goal  Pt will go Sit to Stand with modified independence;with upper extremity assist  PT Goal: Sit to Stand - Progress Progressing toward goal  Pt will go Stand to Sit with modified independence;with upper extremity assist  PT Goal: Stand to Sit - Progress Progressing toward goal  Pt will Transfer Bed to Chair/Chair to Bed with modified independence  PT Transfer Goal: Bed to Chair/Chair to Bed - Progress Progressing toward goal  Pt will Ambulate >150 feet;with supervision;with rolling walker  PT Goal: Ambulate - Progress Met  Pt will Go Up / Down Stairs 1-2 stairs;with min assist;with rolling walker  PT Goal: Up/Down Stairs - Progress Progressing toward goal  PT Treatments  $Gait Training 8-22 mins    L.  DPT [phone]

## 2011-09-25 NOTE — Progress Notes (Signed)
Subjective: 3 Days Post-Op Procedure(s) (LRB): TOTAL KNEE ARTHROPLASTY (Right) Patient reports pain as mild.   Pt c/o slight swelling of tip of tongue and sore on the undersurface which she states has been present since the surgery.  Slight improvement since surgery, no breathing difficulty just uncomfortable.    Objective: Vital signs in last 24 hours: Temp:  [98.2 F (36.8 C)-100.3 F (37.9 C)] 98.6 F (37 C) (08/26 0523) Pulse Rate:  [72-81] 75  (08/26 0523) Resp:  [18-20] 18  (08/26 0523) BP: (123-147)/(53-66) 141/62 mmHg (08/26 0523) SpO2:  [94 %-99 %] 99 % (08/26 0523)  Intake/Output from previous day: 08/25 0701 - 08/26 0700 In: 960 [P.O.:960] Out: -  Intake/Output this shift:     Basename 09/25/11 0500 09/24/11 0535 09/23/11 0515  HGB 9.7* 9.5* 10.0*    Basename 09/25/11 0500 09/24/11 0535  WBC 9.8 10.5  RBC 3.49* 3.56*  HCT 29.8* 30.1*  PLT 249 235    Basename 09/25/11 0500 09/24/11 1550  NA 143 139  K 3.9 3.8  CL 104 98  CO2 32 35*  BUN 8 9  CREATININE 0.57 0.62  GLUCOSE 97 104*  CALCIUM 9.4 9.3   No results found for this basename: LABPT:2,INR:2 in the last 72 hours  Neurovascular intact Sensation intact distally Intact pulses distally Dorsiflexion/Plantar flexion intact Incision: dressing C/D/I and scant drainage No cellulitis present  Assessment/Plan: 3 Days Post-Op Procedure(s) (LRB): TOTAL KNEE ARTHROPLASTY (Right) Discharge home with home health Ice/salt water rinse fore mouth sore. Lovenox teaching prior to d/c home Likely extend Lovenox use for additional 2 weeks with CA hx for extended DVT proph, discussed with pt  , Ivin Booty 09/25/2011, 1:22 PM

## 2011-09-26 ENCOUNTER — Encounter (HOSPITAL_COMMUNITY): Payer: Self-pay | Admitting: Orthopedic Surgery

## 2017-11-02 ENCOUNTER — Other Ambulatory Visit (HOSPITAL_COMMUNITY): Payer: Self-pay | Admitting: *Deleted

## 2017-11-02 NOTE — Patient Instructions (Addendum)
Meredith Rose  11/02/2017   Your procedure is scheduled on: 11-16-17  Report to [facility] Main  Entrance  Report to admitting at 730 AM    Call this number if you have problems the morning of surgery [phone]   Remember: Do not eat food or drink liquids :After Midnight. BRUSH YOUR TEETH MORNING OF SURGERY AND RINSE YOUR MOUTH OUT, NO CHEWING GUM CANDY OR MINTS.     Take these medicines the morning of surgery with A SIP OF WATER: propranolol, eye drop, levothyroxine (synthroid)                               You may not have any metal on your body including hair pins and              piercings  Do not wear jewelry, make-up, lotions, powders or perfumes, deodorant             Do not wear nail polish.  Do not shave  48 hours prior to surgery.              Men may shave face and neck.   Do not bring valuables to the hospital. [place].  Contacts, dentures or bridgework may not be worn into surgery.  Leave suitcase in the car. After surgery it may be brought to your room.     Patients discharged the day of surgery will not be allowed to drive home.  Name and phone number of your driver:  Special Instructions: N/A              Please read over the following fact sheets you were given: _____________________________________________________________________             [facility] - Preparing for Surgery Before surgery, you can play an important role.  Because skin is not sterile, your skin needs to be as free of germs as possible.  You can reduce the number of germs on your skin by washing with CHG (chlorahexidine gluconate) soap before surgery.  CHG is an antiseptic cleaner which kills germs and bonds with the skin to continue killing germs even after washing. Please DO NOT use if you have an allergy to CHG or antibacterial soaps.  If your skin becomes reddened/irritated stop using the CHG and inform your  nurse when you arrive at Short Stay. Do not shave (including legs and underarms) for at least 48 hours prior to the first CHG shower.  You may shave your face/neck. Please follow these instructions carefully:  1.  Shower with CHG Soap the night before surgery and the  morning of Surgery.  2.  If you choose to wash your hair, wash your hair first as usual with your  normal  shampoo.  3.  After you shampoo, rinse your hair and body thoroughly to remove the  shampoo.                           4.  Use CHG as you would any other liquid soap.  You can apply chg directly  to the skin and wash  Gently with a scrungie or clean washcloth.  5.  Apply the CHG Soap to your body ONLY FROM THE NECK DOWN.   Do not use on face/ open                           Wound or open sores. Avoid contact with eyes, ears mouth and genitals (private parts).                       Wash face,  Genitals (private parts) with your normal soap.             6.  Wash thoroughly, paying special attention to the area where your surgery  will be performed.  7.  Thoroughly rinse your body with warm water from the neck down.  8.  DO NOT shower/wash with your normal soap after using and rinsing off  the CHG Soap.                9.  Pat yourself dry with a clean towel.            10.  Wear clean pajamas.            11.  Place clean sheets on your bed the night of your first shower and do not  sleep with pets. Day of Surgery : Do not apply any lotions/deodorants the morning of surgery.  Please wear clean clothes to the hospital/surgery center.  FAILURE TO FOLLOW THESE INSTRUCTIONS MAY RESULT IN THE CANCELLATION OF YOUR SURGERY PATIENT SIGNATURE_________________________________  NURSE SIGNATURE__________________________________  ________________________________________________________________________   Adam Phenix  An incentive spirometer is a tool that can help keep your lungs clear and active. This tool  measures how well you are filling your lungs with each breath. Taking long deep breaths may help reverse or decrease the chance of developing breathing (pulmonary) problems (especially infection) following:  A long period of time when you are unable to move or be active. BEFORE THE PROCEDURE   If the spirometer includes an indicator to show your best effort, your nurse or respiratory therapist will set it to a desired goal.  If possible, sit up straight or lean slightly forward. Try not to slouch.  Hold the incentive spirometer in an upright position. INSTRUCTIONS FOR USE  1. Sit on the edge of your bed if possible, or sit up as far as you can in bed or on a chair. 2. Hold the incentive spirometer in an upright position. 3. Breathe out normally. 4. Place the mouthpiece in your mouth and seal your lips tightly around it. 5. Breathe in slowly and as deeply as possible, raising the piston or the ball toward the top of the column. 6. Hold your breath for 3-5 seconds or for as long as possible. Allow the piston or ball to fall to the bottom of the column. 7. Remove the mouthpiece from your mouth and breathe out normally. 8. Rest for a few seconds and repeat Steps 1 through 7 at least 10 times every 1-2 hours when you are awake. Take your time and take a few normal breaths between deep breaths. 9. The spirometer may include an indicator to show your best effort. Use the indicator as a goal to work toward during each repetition. 10. After each set of 10 deep breaths, practice coughing to be sure your lungs are clear. If you have an incision (the cut made at the time of surgery),  support your incision when coughing by placing a pillow or rolled up towels firmly against it. Once you are able to get out of bed, walk around indoors and cough well. You may stop using the incentive spirometer when instructed by your caregiver.  RISKS AND COMPLICATIONS  Take your time so you do not get dizzy or  light-headed.  If you are in pain, you may need to take or ask for pain medication before doing incentive spirometry. It is harder to take a deep breath if you are having pain. AFTER USE  Rest and breathe slowly and easily.  It can be helpful to keep track of a log of your progress. Your caregiver can provide you with a simple table to help with this. If you are using the spirometer at home, follow these instructions: [place] IF:   You are having difficultly using the spirometer.  You have trouble using the spirometer as often as instructed.  Your pain medication is not giving enough relief while using the spirometer.  You develop fever of 100.5 F (38.1 C) or higher. SEEK IMMEDIATE MEDICAL CARE IF:   You cough up bloody sputum that had not been present before.  You develop fever of 102 F (38.9 C) or greater.  You develop worsening pain at or near the incision site. MAKE SURE YOU:   Understand these instructions.  Will watch your condition.  Will get help right away if you are not doing well or get worse. Document Released: 05/29/2006 Document Revised: 04/10/2011 Document Reviewed: 07/30/2006 Exit[employer] Patient Information 2014 [employer], [place].   ________________________________________________________________________  WHAT IS A BLOOD TRANSFUSION? Blood Transfusion Information  A transfusion is the replacement of blood or some of its parts. Blood is made up of multiple cells which provide different functions.  Red blood cells carry oxygen and are used for blood loss replacement.  White blood cells fight against infection.  Platelets control bleeding.  Plasma helps clot blood.  Other blood products are available for specialized needs, such as hemophilia or other clotting disorders. BEFORE THE TRANSFUSION  Who gives blood for transfusions?   Healthy volunteers who are fully evaluated to make sure their blood is safe. This is blood bank  blood. Transfusion therapy is the safest it has ever been in the practice of medicine. Before blood is taken from a donor, a complete history is taken to make sure that person has no history of diseases nor engages in risky social behavior (examples are intravenous drug use or sexual activity with multiple partners). The donor's travel history is screened to minimize risk of transmitting infections, such as malaria. The donated blood is tested for signs of infectious diseases, such as HIV and hepatitis. The blood is then tested to be sure it is compatible with you in order to minimize the chance of a transfusion reaction. If you or a relative donates blood, this is often done in anticipation of surgery and is not appropriate for emergency situations. It takes many days to process the donated blood. RISKS AND COMPLICATIONS Although transfusion therapy is very safe and saves many lives, the main dangers of transfusion include:   Getting an infectious disease.  Developing a transfusion reaction. This is an allergic reaction to something in the blood you were given. Every precaution is taken to prevent this. The decision to have a blood transfusion has been considered carefully by your caregiver before blood is given. Blood is not given unless the benefits outweigh the risks. AFTER THE TRANSFUSION  Right after receiving a blood transfusion, you will usually feel much better and more energetic. This is especially true if your red blood cells have gotten low (anemic). The transfusion raises the level of the red blood cells which carry oxygen, and this usually causes an energy increase.  The nurse administering the transfusion will monitor you carefully for complications. HOME CARE INSTRUCTIONS  No special instructions are needed after a transfusion. You may find your energy is better. Speak with your caregiver about any limitations on activity for underlying diseases you may have. SEEK MEDICAL CARE IF:    Your condition is not improving after your transfusion.  You develop redness or irritation at the intravenous (IV) site. SEEK IMMEDIATE MEDICAL CARE IF:  Any of the following symptoms occur over the next 12 hours:  Shaking chills.  You have a temperature by mouth above 102 F (38.9 C), not controlled by medicine.  Chest, back, or muscle pain.  People around you feel you are not acting correctly or are confused.  Shortness of breath or difficulty breathing.  Dizziness and fainting.  You get a rash or develop hives.  You have a decrease in urine output.  Your urine turns a dark color or changes to pink, red, or [place]. Any of the following symptoms occur over the next 10 days:  You have a temperature by mouth above 102 F (38.9 C), not controlled by medicine.  Shortness of breath.  Weakness after normal activity.  The white part of the eye turns yellow (jaundice).  You have a decrease in the amount of urine or are urinating less often.  Your urine turns a dark color or changes to pink, red, or [place]. Document Released: 01/14/2000 Document Revised: 04/10/2011 Document Reviewed: 09/02/2007 [facility] Patient Information 2014 [place], [place].  _______________________________________________________________________

## 2017-11-05 ENCOUNTER — Ambulatory Visit: Payer: Self-pay | Admitting: Physician Assistant

## 2017-11-05 NOTE — H&P (View-Only) (Signed)
TOTAL KNEE ADMISSION H&P  Patient is being admitted for left total knee arthroplasty.  Subjective:  Chief Complaint:left knee pain.  HPI: Meredith Rose, 67 y.o. female, has a history of pain and functional disability in the left knee due to arthritis and has failed non-surgical conservative treatments for greater than 12 weeks to includeNSAID's and/or analgesics, corticosteriod injections and activity modification.  Onset of symptoms was gradual, starting 8 years ago with gradually worsening course since that time. The patient noted no past surgery on the left knee(s).  Patient currently rates pain in the left knee(s) at 8 out of 10 with activity. Patient has night pain, worsening of pain with activity and weight bearing, pain that interferes with activities of daily living, pain with passive range of motion, crepitus and joint swelling.  Patient has evidence of periarticular osteophytes and joint space narrowing by imaging studies. . There is no active infection.  Patient Active Problem List   Diagnosis Date Noted  . Hypokalemia 09/23/2011  . Hypotension 09/23/2011  . 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 Postop check 06/20/2011  . Bowel obstruction ([place]) 06/20/2011  . Ileus ([place]) 06/20/2011  . Hypertension   . Anxiety   . Hypothyroidism   . Ruptured appendicitis s/p laparoscopic converted to open appendectomy and repair of enterotomy 06/06/11 06/03/2011   Past Medical History:  Diagnosis Date  . Anxiety   . Arthritis   . Hypertension    takes meds daily  . Hypothyroidism    takes meds daily  . Ovarian cancer 2002  . Ruptured appendicitis 06/03/11  . 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 ABDOMINAL HYSTERECTOMY  2002   "radical; for ovarian cancer"  . ABDOMINAL WOUND DEHISCENCE  2002-2003   "twice"  . APPENDECTOMY  06/05/2011   Procedure: APPENDECTOMY;  Surgeon: Gwenyth Ober, MD;  Location: [facility];  Service: General;;  .  KNEE ARTHROSCOPY  ~ 2007   left  . KNEE ARTHROSCOPY  12/2010   right  . SQUAMOUS CELL CARCINOMA EXCISION  10/2009   right shin  . TOTAL KNEE ARTHROPLASTY  09/22/2011   Procedure: TOTAL KNEE ARTHROPLASTY;  Surgeon: Yvette Rack., MD;  Location: [facility];  Service: Orthopedics;  Laterality: Right;    Current Outpatient Medications  Medication Sig Dispense Refill Last Dose  . enoxaparin (LOVENOX) 30 MG/0.3ML injection Inject 0.3 mLs (30 mg total) into the skin every 12 (twelve) hours. 48 Syringe 0   . HYDROcodone-acetaminophen (NORCO) 5-325 MG per tablet 1-2 tabs po q4-6hrs prn pain 90 tablet 0   . levothyroxine (SYNTHROID, LEVOTHROID) 112 MCG tablet Take 112 mcg by mouth daily.     09/22/2011 at Unknown  . losartan-hydrochlorothiazide (HYZAAR) 100-25 MG per tablet Take 1 tablet by mouth daily.     09/21/2011 at Unknown  . propranolol (INDERAL) 20 MG tablet Take 20 mg by mouth daily.   09/22/2011 at Unknown  . sertraline (ZOLOFT) 100 MG tablet Take 100 mg by mouth daily.   09/22/2011 at Unknown   No current facility-administered medications for this visit.    Allergies  Allergen Reactions  . Tape Itching    Social History   Tobacco Use  . Smoking status: Never Smoker  . Smokeless tobacco: Never Used  Substance Use Topics  . Alcohol use: No    Comment: 06/20/11 "have drank once or twice in my life"    No family history on file.  Review of Systems  Musculoskeletal: Positive for joint pain.  All other systems reviewed and are negative.   Objective:  Physical Exam  Constitutional: She is oriented to person, place, and time. She appears well-developed and well-nourished. No distress.  HENT:  Head: Normocephalic and atraumatic.  Nose: Nose normal.  Eyes: Pupils are equal, round, and reactive to light. Conjunctivae and EOM are normal.  Neck: Normal range of motion. Neck supple.  Cardiovascular: Normal rate, regular rhythm, normal heart sounds and intact distal pulses.  Respiratory:  Effort normal and breath sounds normal. No respiratory distress. She has no wheezes.  GI: Soft. Bowel sounds are normal. She exhibits no distension. There is no tenderness.  Musculoskeletal:       Left knee: She exhibits swelling and bony tenderness. She exhibits normal range of motion, no effusion, no ecchymosis, no deformity, no laceration and no erythema. Tenderness found. Medial joint line and lateral joint line tenderness noted.  Lymphadenopathy:    She has no cervical adenopathy.  Neurological: She is alert and oriented to person, place, and time. No cranial nerve deficit.  Skin: Skin is warm and dry. No rash noted. No erythema.  Psychiatric: She has a normal mood and affect. Her behavior is normal.    Vital signs in last 24 hours: @VSRANGES @  Labs:   Estimated body mass index is 33.41 kg/m as calculated from the following:   Height as of 09/22/11: 5\' 6"  (1.676 m).   Weight as of 09/22/11: 93.9 kg.   Imaging Review Plain radiographs demonstrate moderate degenerative joint disease of the left knee(s). The overall alignment issignificant varus. The bone quality appears to be good for age and reported activity level.   Preoperative templating of the joint replacement has been completed, documented, and submitted to the Operating Room personnel in order to optimize intra-operative equipment management.   Anticipated LOS equal to or greater than 2 midnights due to - Age 20 and older with one or more of the following:  - Obesity  - Expected need for hospital services (PT, OT, Nursing) required for safe  discharge  - Anticipated need for postoperative skilled nursing care or inpatient rehab  - Active co-morbidities: None OR   - Unanticipated findings during/Post Surgery: None  - Patient is a high risk of re-admission due to: None     Assessment/Plan:  End stage arthritis, left knee   The patient history, physical examination, clinical judgment of the provider and imaging  studies are consistent with end stage degenerative joint disease of the left knee(s) and total knee arthroplasty is deemed medically necessary. The treatment options including medical management, injection therapy arthroscopy and arthroplasty were discussed at length. The risks and benefits of total knee arthroplasty were presented and reviewed. The risks due to aseptic loosening, infection, stiffness, patella tracking problems, thromboembolic complications and other imponderables were discussed. The patient acknowledged the explanation, agreed to proceed with the plan and consent was signed. Patient is being admitted for inpatient treatment for surgery, pain control, PT, OT, prophylactic antibiotics, VTE prophylaxis, progressive ambulation and ADL's and discharge planning. The patient is planning to be discharged home with home health services

## 2017-11-05 NOTE — H&P (Signed)
TOTAL KNEE ADMISSION H&P  Patient is being admitted for left total knee arthroplasty.  Subjective:  Chief Complaint:left knee pain.  HPI: Meredith Rose, 67 y.o. female, has a history of pain and functional disability in the left knee due to arthritis and has failed non-surgical conservative treatments for greater than 12 weeks to includeNSAID's and/or analgesics, corticosteriod injections and activity modification.  Onset of symptoms was gradual, starting 8 years ago with gradually worsening course since that time. The patient noted no past surgery on the left knee(s).  Patient currently rates pain in the left knee(s) at 8 out of 10 with activity. Patient has night pain, worsening of pain with activity and weight bearing, pain that interferes with activities of daily living, pain with passive range of motion, crepitus and joint swelling.  Patient has evidence of periarticular osteophytes and joint space narrowing by imaging studies. . There is no active infection.  Patient Active Problem List   Diagnosis Date Noted  . Hypokalemia 09/23/2011  . Hypotension 09/23/2011  . 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 Postop check 06/20/2011  . Bowel obstruction ([place]) 06/20/2011  . Ileus ([place]) 06/20/2011  . Hypertension   . Anxiety   . Hypothyroidism   . Ruptured appendicitis s/p laparoscopic converted to open appendectomy and repair of enterotomy 06/06/11 06/03/2011   Past Medical History:  Diagnosis Date  . Anxiety   . Arthritis   . Hypertension    takes meds daily  . Hypothyroidism    takes meds daily  . Ovarian cancer 2002  . Ruptured appendicitis 06/03/11  . 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 ABDOMINAL HYSTERECTOMY  2002   "radical; for ovarian cancer"  . ABDOMINAL WOUND DEHISCENCE  2002-2003   "twice"  . APPENDECTOMY  06/05/2011   Procedure: APPENDECTOMY;  Surgeon: Gwenyth Ober, MD;  Location: [facility];  Service: General;;  .  KNEE ARTHROSCOPY  ~ 2007   left  . KNEE ARTHROSCOPY  12/2010   right  . SQUAMOUS CELL CARCINOMA EXCISION  10/2009   right shin  . TOTAL KNEE ARTHROPLASTY  09/22/2011   Procedure: TOTAL KNEE ARTHROPLASTY;  Surgeon: Yvette Rack., MD;  Location: [facility];  Service: Orthopedics;  Laterality: Right;    Current Outpatient Medications  Medication Sig Dispense Refill Last Dose  . enoxaparin (LOVENOX) 30 MG/0.3ML injection Inject 0.3 mLs (30 mg total) into the skin every 12 (twelve) hours. 48 Syringe 0   . HYDROcodone-acetaminophen (NORCO) 5-325 MG per tablet 1-2 tabs po q4-6hrs prn pain 90 tablet 0   . levothyroxine (SYNTHROID, LEVOTHROID) 112 MCG tablet Take 112 mcg by mouth daily.     09/22/2011 at Unknown  . losartan-hydrochlorothiazide (HYZAAR) 100-25 MG per tablet Take 1 tablet by mouth daily.     09/21/2011 at Unknown  . propranolol (INDERAL) 20 MG tablet Take 20 mg by mouth daily.   09/22/2011 at Unknown  . sertraline (ZOLOFT) 100 MG tablet Take 100 mg by mouth daily.   09/22/2011 at Unknown   No current facility-administered medications for this visit.    Allergies  Allergen Reactions  . Tape Itching    Social History   Tobacco Use  . Smoking status: Never Smoker  . Smokeless tobacco: Never Used  Substance Use Topics  . Alcohol use: No    Comment: 06/20/11 "have drank once or twice in my life"    No family history on file.  Review of Systems  Musculoskeletal: Positive for joint pain.  All other systems reviewed and are negative.   Objective:  Physical Exam  Constitutional: She is oriented to person, place, and time. She appears well-developed and well-nourished. No distress.  HENT:  Head: Normocephalic and atraumatic.  Nose: Nose normal.  Eyes: Pupils are equal, round, and reactive to light. Conjunctivae and EOM are normal.  Neck: Normal range of motion. Neck supple.  Cardiovascular: Normal rate, regular rhythm, normal heart sounds and intact distal pulses.  Respiratory:  Effort normal and breath sounds normal. No respiratory distress. She has no wheezes.  GI: Soft. Bowel sounds are normal. She exhibits no distension. There is no tenderness.  Musculoskeletal:       Left knee: She exhibits swelling and bony tenderness. She exhibits normal range of motion, no effusion, no ecchymosis, no deformity, no laceration and no erythema. Tenderness found. Medial joint line and lateral joint line tenderness noted.  Lymphadenopathy:    She has no cervical adenopathy.  Neurological: She is alert and oriented to person, place, and time. No cranial nerve deficit.  Skin: Skin is warm and dry. No rash noted. No erythema.  Psychiatric: She has a normal mood and affect. Her behavior is normal.    Vital signs in last 24 hours: @VSRANGES @  Labs:   Estimated body mass index is 33.41 kg/m as calculated from the following:   Height as of 09/22/11: 5\' 6"  (1.676 m).   Weight as of 09/22/11: 93.9 kg.   Imaging Review Plain radiographs demonstrate moderate degenerative joint disease of the left knee(s). The overall alignment issignificant varus. The bone quality appears to be good for age and reported activity level.   Preoperative templating of the joint replacement has been completed, documented, and submitted to the Operating Room personnel in order to optimize intra-operative equipment management.   Anticipated LOS equal to or greater than 2 midnights due to - Age 23 and older with one or more of the following:  - Obesity  - Expected need for hospital services (PT, OT, Nursing) required for safe  discharge  - Anticipated need for postoperative skilled nursing care or inpatient rehab  - Active co-morbidities: None OR   - Unanticipated findings during/Post Surgery: None  - Patient is a high risk of re-admission due to: None     Assessment/Plan:  End stage arthritis, left knee   The patient history, physical examination, clinical judgment of the provider and imaging  studies are consistent with end stage degenerative joint disease of the left knee(s) and total knee arthroplasty is deemed medically necessary. The treatment options including medical management, injection therapy arthroscopy and arthroplasty were discussed at length. The risks and benefits of total knee arthroplasty were presented and reviewed. The risks due to aseptic loosening, infection, stiffness, patella tracking problems, thromboembolic complications and other imponderables were discussed. The patient acknowledged the explanation, agreed to proceed with the plan and consent was signed. Patient is being admitted for inpatient treatment for surgery, pain control, PT, OT, prophylactic antibiotics, VTE prophylaxis, progressive ambulation and ADL's and discharge planning. The patient is planning to be discharged home with home health services

## 2017-11-08 ENCOUNTER — Encounter (HOSPITAL_COMMUNITY)
Admission: RE | Admit: 2017-11-08 | Discharge: 2017-11-08 | Disposition: A | Discharge status: Home / Self Care | Payer: Medicare Other | Source: Ambulatory Visit | Attending: Orthopedic Surgery | Admitting: Orthopedic Surgery

## 2017-11-08 ENCOUNTER — Encounter (HOSPITAL_COMMUNITY): Payer: Self-pay

## 2017-11-08 ENCOUNTER — Ambulatory Visit (HOSPITAL_COMMUNITY)
Admission: RE | Admit: 2017-11-08 | Discharge: 2017-11-08 | Disposition: A | Discharge status: Home / Self Care | Payer: Medicare Other | Source: Ambulatory Visit | Attending: Physician Assistant | Admitting: Physician Assistant

## 2017-11-08 ENCOUNTER — Other Ambulatory Visit: Payer: Self-pay

## 2017-11-08 ENCOUNTER — Encounter (INDEPENDENT_AMBULATORY_CARE_PROVIDER_SITE_OTHER): Payer: Self-pay

## 2017-11-08 DIAGNOSIS — M1712 Unilateral primary osteoarthritis, left knee: Secondary | ICD-10-CM | POA: Diagnosis not present

## 2017-11-08 DIAGNOSIS — R001 Bradycardia, unspecified: Secondary | ICD-10-CM | POA: Diagnosis not present

## 2017-11-08 DIAGNOSIS — I10 Essential (primary) hypertension: Secondary | ICD-10-CM | POA: Insufficient documentation

## 2017-11-08 DIAGNOSIS — Z01818 Encounter for other preprocedural examination: Secondary | ICD-10-CM | POA: Insufficient documentation

## 2017-11-08 DIAGNOSIS — I517 Cardiomegaly: Secondary | ICD-10-CM | POA: Diagnosis not present

## 2017-11-08 HISTORY — DX: Other general symptoms and signs: R68.89

## 2017-11-08 LAB — CBC WITH DIFFERENTIAL/PLATELET
Abs Immature Granulocytes: 0.06 10*3/uL (ref 0.00–0.07)
BASOS ABS: 0.1 10*3/uL (ref 0.0–0.1)
Basophils Relative: 1 %
Eosinophils Absolute: 0.7 10*3/uL — ABNORMAL HIGH (ref 0.0–0.5)
Eosinophils Relative: 8 %
HEMATOCRIT: 42.1 % (ref 36.0–46.0)
HEMOGLOBIN: 13 g/dL (ref 12.0–15.0)
IMMATURE GRANULOCYTES: 1 %
LYMPHS ABS: 2.5 10*3/uL (ref 0.7–4.0)
LYMPHS PCT: 30 %
MCH: 27 pg (ref 26.0–34.0)
MCHC: 30.9 g/dL (ref 30.0–36.0)
MCV: 87.5 fL (ref 80.0–100.0)
Monocytes Absolute: 0.5 10*3/uL (ref 0.1–1.0)
Monocytes Relative: 6 %
NEUTROS ABS: 4.6 10*3/uL (ref 1.7–7.7)
NEUTROS PCT: 54 %
NRBC: 0 % (ref 0.0–0.2)
Platelets: 298 10*3/uL (ref 150–400)
RBC: 4.81 MIL/uL (ref 3.87–5.11)
RDW: 14.3 % (ref 11.5–15.5)
WBC: 8.4 10*3/uL (ref 4.0–10.5)

## 2017-11-08 LAB — APTT: aPTT: 38 seconds — ABNORMAL HIGH (ref 24–36)

## 2017-11-08 LAB — URINALYSIS, ROUTINE W REFLEX MICROSCOPIC
BACTERIA UA: NONE SEEN
Bilirubin Urine: NEGATIVE
Glucose, UA: NEGATIVE mg/dL
Ketones, ur: NEGATIVE mg/dL
Leukocytes, UA: NEGATIVE
Nitrite: NEGATIVE
Protein, ur: NEGATIVE mg/dL
Specific Gravity, Urine: 1.014 (ref 1.005–1.030)
pH: 6 (ref 5.0–8.0)

## 2017-11-08 LAB — SURGICAL PCR SCREEN
MRSA, PCR: NEGATIVE
STAPHYLOCOCCUS AUREUS: NEGATIVE

## 2017-11-08 LAB — COMPREHENSIVE METABOLIC PANEL
ALBUMIN: 4.4 g/dL (ref 3.5–5.0)
ALK PHOS: 82 U/L (ref 38–126)
ALT: 19 U/L (ref 0–44)
ANION GAP: 9 (ref 5–15)
AST: 23 U/L (ref 15–41)
BILIRUBIN TOTAL: 0.8 mg/dL (ref 0.3–1.2)
BUN: 12 mg/dL (ref 8–23)
CO2: 30 mmol/L (ref 22–32)
Calcium: 10 mg/dL (ref 8.9–10.3)
Chloride: 105 mmol/L (ref 98–111)
Creatinine, Ser: 0.81 mg/dL (ref 0.44–1.00)
GFR calc Af Amer: >60 mL/min (ref >60)
GFR calc non Af Amer: >60 mL/min (ref >60)
Glucose, Bld: 109 mg/dL — ABNORMAL HIGH (ref 70–99)
POTASSIUM: 4.1 mmol/L (ref 3.5–5.1)
SODIUM: 144 mmol/L (ref 135–145)
TOTAL PROTEIN: 7.5 g/dL (ref 6.5–8.1)

## 2017-11-08 LAB — PROTIME-INR
INR: 0.94
PROTHROMBIN TIME: 12.5 s (ref 11.4–15.2)

## 2017-11-08 LAB — ABO/RH: ABO/RH(D): A POS

## 2017-11-09 LAB — URINE CULTURE

## 2017-11-09 NOTE — Progress Notes (Signed)
ua and urine culture results routed to dr Quillian Quince caffrey by epic

## 2017-11-15 MED ORDER — TRANEXAMIC ACID 1000 MG/10ML IV SOLN
2000.0000 mg | INTRAVENOUS | Status: DC
  Filled 2017-11-15: qty 20

## 2017-11-15 MED ORDER — BUPIVACAINE LIPOSOME 1.3 % IJ SUSP
20.0000 mL | INTRAMUSCULAR | Status: DC
  Filled 2017-11-15: qty 20

## 2017-11-15 NOTE — Progress Notes (Signed)
Patient called and notified of time change and to report to Admitting at [facility] on Friday 11/16/2017 at 0530 am. Nothing by mouth after midnight and follow other instructions she was given at pre-op appointment for her medication. Patient verbalized understanding.

## 2017-11-16 ENCOUNTER — Encounter (HOSPITAL_COMMUNITY): Payer: Self-pay | Admitting: *Deleted

## 2017-11-16 ENCOUNTER — Observation Stay (HOSPITAL_COMMUNITY)
Admission: AD | Admit: 2017-11-16 | Discharge: 2017-11-17 | Disposition: A | Discharge status: Home / Self Care | Payer: Medicare Other | Source: Ambulatory Visit | Attending: Orthopedic Surgery | Admitting: Orthopedic Surgery

## 2017-11-16 ENCOUNTER — Ambulatory Visit (HOSPITAL_COMMUNITY): Payer: Medicare Other | Admitting: Anesthesiology

## 2017-11-16 ENCOUNTER — Encounter (HOSPITAL_COMMUNITY)
Admission: AD | Disposition: A | Discharge status: Home / Self Care | Payer: Self-pay | Source: Ambulatory Visit | Attending: Orthopedic Surgery

## 2017-11-16 ENCOUNTER — Other Ambulatory Visit: Payer: Self-pay

## 2017-11-16 DIAGNOSIS — Z8543 Personal history of malignant neoplasm of ovary: Secondary | ICD-10-CM | POA: Diagnosis not present

## 2017-11-16 DIAGNOSIS — Z96659 Presence of unspecified artificial knee joint: Secondary | ICD-10-CM

## 2017-11-16 DIAGNOSIS — I10 Essential (primary) hypertension: Secondary | ICD-10-CM | POA: Insufficient documentation

## 2017-11-16 DIAGNOSIS — Z6839 Body mass index (BMI) 39.0-39.9, adult: Secondary | ICD-10-CM | POA: Insufficient documentation

## 2017-11-16 DIAGNOSIS — F419 Anxiety disorder, unspecified: Secondary | ICD-10-CM | POA: Diagnosis not present

## 2017-11-16 DIAGNOSIS — Z888 Allergy status to other drugs, medicaments and biological substances status: Secondary | ICD-10-CM | POA: Insufficient documentation

## 2017-11-16 DIAGNOSIS — R001 Bradycardia, unspecified: Secondary | ICD-10-CM | POA: Diagnosis not present

## 2017-11-16 DIAGNOSIS — M21162 Varus deformity, not elsewhere classified, left knee: Secondary | ICD-10-CM | POA: Insufficient documentation

## 2017-11-16 DIAGNOSIS — Z85828 Personal history of other malignant neoplasm of skin: Secondary | ICD-10-CM | POA: Insufficient documentation

## 2017-11-16 DIAGNOSIS — M1712 Unilateral primary osteoarthritis, left knee: Principal | ICD-10-CM | POA: Insufficient documentation

## 2017-11-16 DIAGNOSIS — E039 Hypothyroidism, unspecified: Secondary | ICD-10-CM | POA: Insufficient documentation

## 2017-11-16 DIAGNOSIS — Z79899 Other long term (current) drug therapy: Secondary | ICD-10-CM | POA: Diagnosis not present

## 2017-11-16 DIAGNOSIS — M24562 Contracture, left knee: Secondary | ICD-10-CM | POA: Insufficient documentation

## 2017-11-16 DIAGNOSIS — M25762 Osteophyte, left knee: Secondary | ICD-10-CM | POA: Diagnosis not present

## 2017-11-16 DIAGNOSIS — Z7982 Long term (current) use of aspirin: Secondary | ICD-10-CM | POA: Insufficient documentation

## 2017-11-16 HISTORY — PX: TOTAL KNEE ARTHROPLASTY: SHX125

## 2017-11-16 LAB — TYPE AND SCREEN
ABO/RH(D): A POS
Antibody Screen: NEGATIVE

## 2017-11-16 SURGERY — ARTHROPLASTY, KNEE, TOTAL
Anesthesia: Spinal | Site: Knee | Laterality: Left

## 2017-11-16 MED ORDER — FENTANYL CITRATE (PF) 100 MCG/2ML IJ SOLN: 25.0000 ug | INTRAMUSCULAR | Status: DC | PRN

## 2017-11-16 MED ORDER — CEFAZOLIN SODIUM-DEXTROSE 2-4 GM/100ML-% IV SOLN
2.0000 g | INTRAVENOUS | Status: AC
  Administered 2017-11-16: 2 g via INTRAVENOUS
  Filled 2017-11-16: qty 100

## 2017-11-16 MED ORDER — HYDROCODONE-ACETAMINOPHEN 5-325 MG PO TABS
1.0000 | ORAL_TABLET | ORAL | Status: DC | PRN
  Administered 2017-11-16 – 2017-11-17 (×5): 2 via ORAL
  Filled 2017-11-16 (×5): qty 2

## 2017-11-16 MED ORDER — BISACODYL 10 MG RE SUPP: 10.0000 mg | Freq: Every day | RECTAL | Status: DC | PRN

## 2017-11-16 MED ORDER — HYDROCODONE-ACETAMINOPHEN 7.5-325 MG PO TABS
1.0000 | ORAL_TABLET | ORAL | Status: DC | PRN
  Administered 2017-11-16: 2 via ORAL
  Filled 2017-11-16: qty 2

## 2017-11-16 MED ORDER — METOCLOPRAMIDE HCL 5 MG PO TABS: 5.0000 mg | ORAL_TABLET | Freq: Three times a day (TID) | ORAL | Status: DC | PRN

## 2017-11-16 MED ORDER — SODIUM CHLORIDE 0.9 % IV SOLN: INTRAVENOUS | Status: DC

## 2017-11-16 MED ORDER — SIMVASTATIN 20 MG PO TABS
20.0000 mg | ORAL_TABLET | Freq: Every day | ORAL | Status: DC
  Administered 2017-11-16: 20 mg via ORAL
  Filled 2017-11-16: qty 1

## 2017-11-16 MED ORDER — FENTANYL CITRATE (PF) 100 MCG/2ML IJ SOLN
INTRAMUSCULAR | Status: DC | PRN
  Administered 2017-11-16: 100 ug via INTRAVENOUS

## 2017-11-16 MED ORDER — ASPIRIN 81 MG PO TABS: 81.0000 mg | ORAL_TABLET | Freq: Two times a day (BID) | ORAL | 0 refills | Status: DC

## 2017-11-16 MED ORDER — ACETAMINOPHEN 325 MG PO TABS: 325.0000 mg | ORAL_TABLET | Freq: Four times a day (QID) | ORAL | Status: DC | PRN

## 2017-11-16 MED ORDER — ONDANSETRON HCL 4 MG PO TABS: 4.0000 mg | ORAL_TABLET | Freq: Four times a day (QID) | ORAL | Status: DC | PRN

## 2017-11-16 MED ORDER — EPHEDRINE 5 MG/ML INJ
INTRAVENOUS | Status: AC
  Filled 2017-11-16: qty 10

## 2017-11-16 MED ORDER — POLYETHYLENE GLYCOL 3350 17 G PO PACK: 17.0000 g | PACK | Freq: Every day | ORAL | Status: DC | PRN

## 2017-11-16 MED ORDER — MIDAZOLAM HCL 5 MG/5ML IJ SOLN
INTRAMUSCULAR | Status: DC | PRN
  Administered 2017-11-16 (×2): 1 mg via INTRAVENOUS

## 2017-11-16 MED ORDER — TRANEXAMIC ACID 1000 MG/10ML IV SOLN
INTRAVENOUS | Status: DC | PRN
  Administered 2017-11-16: 2000 mg via TOPICAL

## 2017-11-16 MED ORDER — CEFAZOLIN SODIUM-DEXTROSE 1-4 GM/50ML-% IV SOLN
1.0000 g | Freq: Four times a day (QID) | INTRAVENOUS | Status: AC
  Administered 2017-11-16 (×2): 1 g via INTRAVENOUS
  Filled 2017-11-16 (×2): qty 50

## 2017-11-16 MED ORDER — SODIUM CHLORIDE 0.9 % IR SOLN
Status: DC | PRN
  Administered 2017-11-16: 1000 mL

## 2017-11-16 MED ORDER — MIDAZOLAM HCL 2 MG/2ML IJ SOLN
INTRAMUSCULAR | Status: AC
  Filled 2017-11-16: qty 2

## 2017-11-16 MED ORDER — SODIUM CHLORIDE 0.9 % IJ SOLN
INTRAMUSCULAR | Status: DC | PRN
  Administered 2017-11-16: 50 mL

## 2017-11-16 MED ORDER — METOCLOPRAMIDE HCL 5 MG/ML IJ SOLN: 10.0000 mg | Freq: Once | INTRAMUSCULAR | Status: DC | PRN

## 2017-11-16 MED ORDER — MEPERIDINE HCL 50 MG/ML IJ SOLN: 6.2500 mg | INTRAMUSCULAR | Status: DC | PRN

## 2017-11-16 MED ORDER — ASPIRIN 81 MG PO CHEW
81.0000 mg | CHEWABLE_TABLET | Freq: Two times a day (BID) | ORAL | Status: DC
  Administered 2017-11-16 – 2017-11-17 (×2): 81 mg via ORAL
  Filled 2017-11-16 (×2): qty 1

## 2017-11-16 MED ORDER — DEXAMETHASONE SODIUM PHOSPHATE 10 MG/ML IJ SOLN
INTRAMUSCULAR | Status: AC
  Filled 2017-11-16: qty 1

## 2017-11-16 MED ORDER — FENTANYL CITRATE (PF) 100 MCG/2ML IJ SOLN
INTRAMUSCULAR | Status: AC
  Filled 2017-11-16: qty 2

## 2017-11-16 MED ORDER — BUPIVACAINE LIPOSOME 1.3 % IJ SUSP
INTRAMUSCULAR | Status: DC | PRN
  Administered 2017-11-16: 20 mL

## 2017-11-16 MED ORDER — ONDANSETRON HCL 4 MG/2ML IJ SOLN
INTRAMUSCULAR | Status: AC
  Filled 2017-11-16: qty 2

## 2017-11-16 MED ORDER — EPHEDRINE SULFATE 50 MG/ML IJ SOLN
INTRAMUSCULAR | Status: DC | PRN
  Administered 2017-11-16: 5 mg via INTRAVENOUS
  Administered 2017-11-16 (×3): 10 mg via INTRAVENOUS
  Administered 2017-11-16 (×3): 5 mg via INTRAVENOUS

## 2017-11-16 MED ORDER — CHLORHEXIDINE GLUCONATE 4 % EX LIQD: 60.0000 mL | Freq: Once | CUTANEOUS | Status: DC

## 2017-11-16 MED ORDER — METHOCARBAMOL 500 MG PO TABS
500.0000 mg | ORAL_TABLET | Freq: Four times a day (QID) | ORAL | Status: DC | PRN
  Administered 2017-11-17 (×2): 500 mg via ORAL
  Filled 2017-11-16 (×2): qty 1

## 2017-11-16 MED ORDER — BUPIVACAINE-EPINEPHRINE 0.5% -1:200000 IJ SOLN
INTRAMUSCULAR | Status: AC
  Filled 2017-11-16: qty 1

## 2017-11-16 MED ORDER — PROPOFOL 10 MG/ML IV BOLUS
INTRAVENOUS | Status: AC
  Filled 2017-11-16: qty 20

## 2017-11-16 MED ORDER — BUPIVACAINE HCL (PF) 0.5 % IJ SOLN
INTRAMUSCULAR | Status: AC
  Filled 2017-11-16: qty 30

## 2017-11-16 MED ORDER — TRANEXAMIC ACID-NACL 1000-0.7 MG/100ML-% IV SOLN
1000.0000 mg | INTRAVENOUS | Status: AC
  Administered 2017-11-16: 1000 mg via INTRAVENOUS
  Filled 2017-11-16: qty 100

## 2017-11-16 MED ORDER — PROPOFOL 10 MG/ML IV BOLUS
INTRAVENOUS | Status: AC
  Filled 2017-11-16: qty 40

## 2017-11-16 MED ORDER — MENTHOL 3 MG MT LOZG: 1.0000 | LOZENGE | OROMUCOSAL | Status: DC | PRN

## 2017-11-16 MED ORDER — ONDANSETRON HCL 4 MG/2ML IJ SOLN: 4.0000 mg | Freq: Four times a day (QID) | INTRAMUSCULAR | Status: DC | PRN

## 2017-11-16 MED ORDER — PHENOL 1.4 % MT LIQD
1.0000 | OROMUCOSAL | Status: DC | PRN
  Filled 2017-11-16: qty 177

## 2017-11-16 MED ORDER — PROPOFOL 500 MG/50ML IV EMUL
INTRAVENOUS | Status: DC | PRN
  Administered 2017-11-16: 50 ug/kg/min via INTRAVENOUS

## 2017-11-16 MED ORDER — STERILE WATER FOR IRRIGATION IR SOLN
Status: DC | PRN
  Administered 2017-11-16: 2000 mL

## 2017-11-16 MED ORDER — METOCLOPRAMIDE HCL 5 MG/ML IJ SOLN: 5.0000 mg | Freq: Three times a day (TID) | INTRAMUSCULAR | Status: DC | PRN

## 2017-11-16 MED ORDER — SODIUM CHLORIDE 0.9 % IJ SOLN
INTRAMUSCULAR | Status: AC
  Filled 2017-11-16: qty 50

## 2017-11-16 MED ORDER — SERTRALINE HCL 100 MG PO TABS
100.0000 mg | ORAL_TABLET | Freq: Every day | ORAL | Status: DC
  Administered 2017-11-16: 100 mg via ORAL
  Filled 2017-11-16: qty 1

## 2017-11-16 MED ORDER — BUPIVACAINE IN DEXTROSE 0.75-8.25 % IT SOLN
INTRATHECAL | Status: DC | PRN
  Administered 2017-11-16: 1.8 mL via INTRATHECAL

## 2017-11-16 MED ORDER — ROPIVACAINE HCL 7.5 MG/ML IJ SOLN
INTRAMUSCULAR | Status: DC | PRN
  Administered 2017-11-16: 20 mL via PERINEURAL

## 2017-11-16 MED ORDER — DOCUSATE SODIUM 100 MG PO CAPS
100.0000 mg | ORAL_CAPSULE | Freq: Two times a day (BID) | ORAL | Status: DC
  Administered 2017-11-16 – 2017-11-17 (×3): 100 mg via ORAL
  Filled 2017-11-16 (×3): qty 1

## 2017-11-16 MED ORDER — SODIUM CHLORIDE 0.9 % IV SOLN
INTRAVENOUS | Status: DC
  Administered 2017-11-16: 12:00:00 via INTRAVENOUS

## 2017-11-16 MED ORDER — BUPIVACAINE HCL (PF) 0.25 % IJ SOLN
INTRAMUSCULAR | Status: AC
  Filled 2017-11-16: qty 30

## 2017-11-16 MED ORDER — PROPRANOLOL HCL 20 MG PO TABS
20.0000 mg | ORAL_TABLET | Freq: Every day | ORAL | Status: DC
  Filled 2017-11-16: qty 1

## 2017-11-16 MED ORDER — LOSARTAN POTASSIUM 50 MG PO TABS
100.0000 mg | ORAL_TABLET | Freq: Every day | ORAL | Status: DC
  Administered 2017-11-16: 100 mg via ORAL
  Filled 2017-11-16 (×2): qty 2

## 2017-11-16 MED ORDER — MORPHINE SULFATE (PF) 2 MG/ML IV SOLN
0.5000 mg | INTRAVENOUS | Status: DC | PRN
  Administered 2017-11-16: 1 mg via INTRAVENOUS
  Filled 2017-11-16: qty 1

## 2017-11-16 MED ORDER — FLEET ENEMA 7-19 GM/118ML RE ENEM: 1.0000 | ENEMA | Freq: Once | RECTAL | Status: DC | PRN

## 2017-11-16 MED ORDER — ONDANSETRON HCL 4 MG/2ML IJ SOLN
INTRAMUSCULAR | Status: DC | PRN
  Administered 2017-11-16: 4 mg via INTRAVENOUS

## 2017-11-16 MED ORDER — TRANEXAMIC ACID-NACL 1000-0.7 MG/100ML-% IV SOLN
1000.0000 mg | Freq: Once | INTRAVENOUS | Status: AC
  Administered 2017-11-16: 1000 mg via INTRAVENOUS
  Filled 2017-11-16: qty 100

## 2017-11-16 MED ORDER — LEVOTHYROXINE SODIUM 100 MCG PO TABS
100.0000 ug | ORAL_TABLET | Freq: Every day | ORAL | Status: DC
  Administered 2017-11-17: 100 ug via ORAL
  Filled 2017-11-16 (×2): qty 1

## 2017-11-16 MED ORDER — HYDROCHLOROTHIAZIDE 25 MG PO TABS
25.0000 mg | ORAL_TABLET | Freq: Every day | ORAL | Status: DC
  Administered 2017-11-16 – 2017-11-17 (×2): 25 mg via ORAL
  Filled 2017-11-16 (×2): qty 1

## 2017-11-16 MED ORDER — METHOCARBAMOL 500 MG IVPB - SIMPLE MED
500.0000 mg | Freq: Four times a day (QID) | INTRAVENOUS | Status: DC | PRN
  Filled 2017-11-16: qty 50

## 2017-11-16 MED ORDER — POTASSIUM CHLORIDE CRYS ER 20 MEQ PO TBCR
20.0000 meq | EXTENDED_RELEASE_TABLET | Freq: Two times a day (BID) | ORAL | Status: DC
  Administered 2017-11-16 – 2017-11-17 (×3): 20 meq via ORAL
  Filled 2017-11-16 (×3): qty 1

## 2017-11-16 MED ORDER — LACTATED RINGERS IV SOLN
INTRAVENOUS | Status: DC
  Administered 2017-11-16 (×2): via INTRAVENOUS

## 2017-11-16 MED ORDER — HYDROCODONE-ACETAMINOPHEN 7.5-325 MG PO TABS: 1.0000 | ORAL_TABLET | ORAL | 0 refills | Status: DC | PRN

## 2017-11-16 MED ORDER — BUPIVACAINE-EPINEPHRINE (PF) 0.5% -1:200000 IJ SOLN
INTRAMUSCULAR | Status: DC | PRN
  Administered 2017-11-16: 30 mL

## 2017-11-16 SURGICAL SUPPLY — 81 items
BAG DECANTER FOR FLEXI CONT (MISCELLANEOUS) ×3 IMPLANT
BAG ZIPLOCK 12X15 (MISCELLANEOUS) ×3 IMPLANT
BANDAGE ACE 6X5 VEL STRL LF (GAUZE/BANDAGES/DRESSINGS) ×3 IMPLANT
BLADE 10 SAFETY STRL DISP (BLADE) ×3 IMPLANT
BLADE SAGITTAL 25.0X1.19X90 (BLADE) ×2 IMPLANT
BLADE SAGITTAL 25.0X1.19X90MM (BLADE) ×1
BLADE SAW SAG 90X13X1.27 (BLADE) ×3 IMPLANT
BLADE SURG 15 STRL LF DISP TIS (BLADE) ×1 IMPLANT
BLADE SURG 15 STRL SS (BLADE) ×2
BNDG ELASTIC 6X10 VLCR STRL LF (GAUZE/BANDAGES/DRESSINGS) ×3 IMPLANT
BOWL SMART MIX CTS (DISPOSABLE) ×3 IMPLANT
CEMENT HV SMART SET (Cement) ×6 IMPLANT
CEMENT TIBIA MBT (Knees) ×1 IMPLANT
COVER SURGICAL LIGHT HANDLE (MISCELLANEOUS) ×3 IMPLANT
COVER WAND RF STERILE (DRAPES) ×3 IMPLANT
CUFF TOURN SGL QUICK 34 (TOURNIQUET CUFF) ×2
CUFF TRNQT CYL 34X4X40X1 (TOURNIQUET CUFF) ×1 IMPLANT
DECANTER SPIKE VIAL GLASS SM (MISCELLANEOUS) ×6 IMPLANT
DRAPE IMP U-DRAPE 54X76 (DRAPES) ×3 IMPLANT
DRAPE ORTHO SPLIT 77X108 STRL (DRAPES)
DRAPE ORTHO SPLIT 87X125 STRL (DRAPES) ×6 IMPLANT
DRAPE SURG ORHT 6 SPLT 77X108 (DRAPES) IMPLANT
DRAPE U-SHAPE 47X51 STRL (DRAPES) ×3 IMPLANT
DRSG ADAPTIC 3X8 NADH LF (GAUZE/BANDAGES/DRESSINGS) ×3 IMPLANT
DRSG PAD ABDOMINAL 8X10 ST (GAUZE/BANDAGES/DRESSINGS) ×6 IMPLANT
DURAPREP 26ML APPLICATOR (WOUND CARE) ×6 IMPLANT
ELECT REM PT RETURN 15FT ADLT (MISCELLANEOUS) ×3 IMPLANT
FEMUR CMT SIGMA LFT SZ 3 (Orthopedic Implant) ×3 IMPLANT
FEMUR CMTD SIGMA LFT SZ 3 (Orthopedic Implant) ×1 IMPLANT
GAUZE SPONGE 4X4 12PLY STRL (GAUZE/BANDAGES/DRESSINGS) ×3 IMPLANT
GLOVE BIO SURGEON STRL SZ 6.5 (GLOVE) ×2 IMPLANT
GLOVE BIO SURGEONS STRL SZ 6.5 (GLOVE) ×1
GLOVE BIOGEL PI IND STRL 6.5 (GLOVE) ×1 IMPLANT
GLOVE BIOGEL PI IND STRL 7.0 (GLOVE) ×1 IMPLANT
GLOVE BIOGEL PI IND STRL 7.5 (GLOVE) ×2 IMPLANT
GLOVE BIOGEL PI IND STRL 8 (GLOVE) ×2 IMPLANT
GLOVE BIOGEL PI IND STRL 9 (GLOVE) ×1 IMPLANT
GLOVE BIOGEL PI INDICATOR 6.5 (GLOVE) ×2
GLOVE BIOGEL PI INDICATOR 7.0 (GLOVE) ×2
GLOVE BIOGEL PI INDICATOR 7.5 (GLOVE) ×4
GLOVE BIOGEL PI INDICATOR 8 (GLOVE) ×4
GLOVE BIOGEL PI INDICATOR 9 (GLOVE) ×2
GLOVE ECLIPSE 8.5 STRL (GLOVE) ×3 IMPLANT
GLOVE SURG ORTHO 8.0 STRL STRW (GLOVE) ×3 IMPLANT
GLOVE SURG SS PI 7.5 STRL IVOR (GLOVE) ×3 IMPLANT
GOWN SPEC L3 XXLG W/TWL (GOWN DISPOSABLE) ×3 IMPLANT
GOWN SRG XL XLNG 56XLVL 4 (GOWN DISPOSABLE) ×1 IMPLANT
GOWN STRL NON-REIN XL XLG LVL4 (GOWN DISPOSABLE) ×2
GOWN STRL REUS W/TWL XL LVL3 (GOWN DISPOSABLE) ×6 IMPLANT
HANDPIECE INTERPULSE COAX TIP (DISPOSABLE) ×2
HOLDER FOLEY CATH W/STRAP (MISCELLANEOUS) ×3 IMPLANT
IMMOBILIZER KNEE 20 (SOFTGOODS) ×3
IMMOBILIZER KNEE 20 THIGH 36 (SOFTGOODS) ×1 IMPLANT
MANIFOLD NEPTUNE II (INSTRUMENTS) ×3 IMPLANT
NDL SAFETY ECLIPSE 18X1.5 (NEEDLE) IMPLANT
NEEDLE HYPO 18GX1.5 SHARP (NEEDLE)
NEEDLE HYPO 22GX1.5 SAFETY (NEEDLE) ×3 IMPLANT
NS IRRIG 1000ML POUR BTL (IV SOLUTION) ×3 IMPLANT
PACK ICE MAXI GEL EZY WRAP (MISCELLANEOUS) ×3 IMPLANT
PACK TOTAL KNEE CUSTOM (KITS) ×3 IMPLANT
PAD ABD 8X10 STRL (GAUZE/BANDAGES/DRESSINGS) ×3 IMPLANT
PADDING CAST COTTON 6X4 STRL (CAST SUPPLIES) ×3 IMPLANT
PATELLA DOME PFC 35MM (Knees) ×3 IMPLANT
PIN STEINMAN FIXATION KNEE (PIN) ×3 IMPLANT
PLATE ROT INSERT 12.5MM SIZE 3 (Plate) ×3 IMPLANT
POSITIONER SURGICAL ARM (MISCELLANEOUS) ×3 IMPLANT
SET HNDPC FAN SPRY TIP SCT (DISPOSABLE) ×1 IMPLANT
SPONGE LAP 18X18 RF (DISPOSABLE) IMPLANT
STAPLER VISISTAT 35W (STAPLE) ×3 IMPLANT
SUT ETHIBOND NAB CT1 #1 30IN (SUTURE) ×6 IMPLANT
SUT VIC AB 0 CT1 36 (SUTURE) ×3 IMPLANT
SUT VIC AB 2-0 CT1 27 (SUTURE) ×4
SUT VIC AB 2-0 CT1 TAPERPNT 27 (SUTURE) ×2 IMPLANT
SYR 30ML LL (SYRINGE) IMPLANT
SYR CONTROL 10ML LL (SYRINGE) ×6 IMPLANT
TIBIA MBT CEMENT (Knees) ×3 IMPLANT
TRAY FOLEY CATH 14FRSI W/METER (CATHETERS) ×3 IMPLANT
TRAY FOLEY MTR SLVR 16FR STAT (SET/KITS/TRAYS/PACK) IMPLANT
WATER STERILE IRR 1000ML POUR (IV SOLUTION) ×6 IMPLANT
WRAP KNEE MAXI GEL POST OP (GAUZE/BANDAGES/DRESSINGS) ×3 IMPLANT
YANKAUER SUCT BULB TIP 10FT TU (MISCELLANEOUS) ×3 IMPLANT

## 2017-11-16 NOTE — Anesthesia Procedure Notes (Signed)
Anesthesia Regional Block: Adductor canal block   Pre-Anesthetic Checklist: ,, timeout performed, Correct Patient, Correct Site, Correct Laterality, Correct Procedure, Correct Position, site marked, Risks and benefits discussed,  Surgical consent,  Pre-op evaluation,  At surgeon's request and post-op pain management  Laterality: Left and Lower  Prep: Maximum Sterile Barrier Precautions used, chloraprep       Needles:  Injection technique: Single-shot  Needle Type: Echogenic Stimulator Needle     Needle Length: 10cm      Additional Needles:   Procedures:,,,, ultrasound used (permanent image in chart),,,,  Narrative:  Start time: 11/16/2017 7:02 AM End time: 11/16/2017 7:12 AM Injection made incrementally with aspirations every 5 mL.  Performed by: Personally  Anesthesiologist: Montez Hageman, MD  Additional Notes: Risks, benefits and alternative to block explained extensively.  Patient tolerated procedure well, without complications.

## 2017-11-16 NOTE — Care Management Obs Status (Signed)
[place] NOTIFICATION   Patient Details  Name: MCKENZE SLONE MRN: [id number] Date of Birth: 1950/03/05   Medicare Observation Status Notification Given:  Yes    Guadalupe Maple, RN 11/16/2017, 4:00 PM

## 2017-11-16 NOTE — Anesthesia Preprocedure Evaluation (Addendum)
Anesthesia Evaluation  Patient identified by MRN, date of birth, ID band Patient awake    Reviewed: Allergy & Precautions, NPO status , Patient's Chart, lab work & pertinent test results  Airway Mallampati: II  TM Distance: >3 FB Neck ROM: Full    Dental no notable dental hx.    Pulmonary neg pulmonary ROS,    Pulmonary exam normal breath sounds clear to auscultation       Cardiovascular hypertension, Pt. on medications  Rhythm:Regular Rate:Bradycardia     Neuro/Psych negative neurological ROS  negative psych ROS   GI/Hepatic negative GI ROS, Neg liver ROS,   Endo/Other  Hypothyroidism Morbid obesity  Renal/GU negative Renal ROS  negative genitourinary   Musculoskeletal negative musculoskeletal ROS (+)   Abdominal   Peds negative pediatric ROS (+)  Hematology negative hematology ROS (+)   Anesthesia Other Findings   Reproductive/Obstetrics negative OB ROS                             Anesthesia Physical Anesthesia Plan  ASA: III  Anesthesia Plan: Spinal   Post-op Pain Management:  Regional for Post-op pain   Induction:   PONV Risk Score and Plan: 2 and Ondansetron and Treatment may vary due to age or medical condition  Airway Management Planned: Simple Face Mask  Additional Equipment:   Intra-op Plan:   Post-operative Plan:   Informed Consent: I have reviewed the patients History and Physical, chart, labs and discussed the procedure including the risks, benefits and alternatives for the proposed anesthesia with the patient or authorized representative who has indicated his/her understanding and acceptance.   Dental advisory given  Plan Discussed with: CRNA  Anesthesia Plan Comments:         Anesthesia Quick Evaluation

## 2017-11-16 NOTE — Interval H&P Note (Signed)
History and Physical Interval Note:  11/16/2017 7:29 AM  Meredith Rose  has presented today for surgery, with the diagnosis of OA LEFT KNEE  The various methods of treatment have been discussed with the patient and family. After consideration of risks, benefits and other options for treatment, the patient has consented to  Procedure(s): LEFT TOTAL KNEE ARTHROPLASTY (Left) as a surgical intervention .  The patient's history has been reviewed, patient examined, no change in status, stable for surgery.  I have reviewed the patient's chart and labs.  Questions were answered to the patient's satisfaction.     Yvette Rack

## 2017-11-16 NOTE — Anesthesia Postprocedure Evaluation (Signed)
Anesthesia Post Note  Patient: Meredith Rose  Procedure(s) Performed: LEFT TOTAL KNEE ARTHROPLASTY (Left Knee)     Patient location during evaluation: PACU Anesthesia Type: Spinal Level of consciousness: awake and alert Pain management: pain level controlled Vital Signs Assessment: post-procedure vital signs reviewed and stable Respiratory status: spontaneous breathing and respiratory function stable Cardiovascular status: blood pressure returned to baseline and stable Postop Assessment: no headache, no backache, spinal receding and no apparent nausea or vomiting Anesthetic complications: no    Last Vitals:  Vitals:   11/16/17 1243 11/16/17 1343  BP: 132/63 124/70  Pulse: (!) 55 (!) 56  Resp: 16 16  Temp: (!) 36.4 C 36.5 C  SpO2: 98% 95%    Last Pain:  Vitals:   11/16/17 1343  TempSrc: Oral  PainSc:                  Montez Hageman

## 2017-11-16 NOTE — Care Management CC44 (Signed)
Condition Code 44 Documentation Completed  Patient Details  Name: Meredith Rose MRN: [id number] Date of Birth: October 15, 1950   Condition Code 44 given:  Yes Patient signature on Condition Code 44 notice:  Yes Documentation of 2 MD's agreement:  Yes Code 44 added to claim:  Yes    Guadalupe Maple, RN 11/16/2017, 4:00 PM

## 2017-11-16 NOTE — Brief Op Note (Signed)
11/16/2017  10:01 AM  PATIENT:  Meredith Rose  67 y.o. female  PRE-OPERATIVE DIAGNOSIS:  OA LEFT KNEE  POST-OPERATIVE DIAGNOSIS:  OA LEFT KNEE  PROCEDURE:  Procedure(s): LEFT TOTAL KNEE ARTHROPLASTY (Left)  SURGEON:  Surgeon(s) and Role:    Earlie Server, MD - Primary  PHYSICIAN ASSISTANT:  , pa-c  ASSISTANTS: OR staff x1   ANESTHESIA:   local, regional, spinal and IV sedation  EBL:  25 mL   BLOOD ADMINISTERED:none  DRAINS: none   LOCAL MEDICATIONS USED:  MARCAINE     SPECIMEN:  No Specimen  DISPOSITION OF SPECIMEN:  N/A  COUNTS:  YES  TOURNIQUET:   Total Tourniquet Time Documented: Thigh (Left) - 62 minutes Total: Thigh (Left) - 62 minutes   DICTATION: .Other Dictation: Dictation Number unknown  PLAN OF CARE: Admit to inpatient   PATIENT DISPOSITION:  PACU - hemodynamically stable.   Delay start of Pharmacological VTE agent (>24hrs) due to surgical blood loss or risk of bleeding: yes

## 2017-11-16 NOTE — Care Management Note (Signed)
Case Management Note  Patient Details  Name: Meredith Rose MRN: [id number] Date of Birth: [birth year]  Subjective/Objective:         Discharge planning, spoke with patient and spouse at bedside. Have chosen [facility] for [facility] PT, evaluate and treat.   Action/Plan: Contacted [facility] for the referral, they accepted. RW, 3n1, and CPM provided by [employer].  Expected Discharge Date:  11/18/17               Expected Discharge Plan:  [place]  In-House Referral:  NA  Discharge planning Services  CM Consult  Post Acute Care Choice:  Home Health Choice offered to:  Patient, Spouse  DME Arranged:  N/A DME Agency:  NA  HH Arranged:  PT [place] Agency:  [facility] (formerly [employer])  Status of Service:  Completed, signed off  If discussed at [employer], dates discussed:    Additional Comments:  Guadalupe Maple, RN 11/16/2017, 4:06 PM

## 2017-11-16 NOTE — Op Note (Signed)
NAME: Meredith Rose, Meredith Rose MEDICAL RECORD [id number] ACCOUNT [id number] DATE OF BIRTH:07-19-1950 FACILITY: [facility] LOCATION: [facility] PHYSICIAN:W.   JR., MD  OPERATIVE REPORT  DATE OF PROCEDURE:  11/16/2017  PREOPERATIVE DIAGNOSIS:  Severe osteoarthritis, left knee with varus deformity and flexion contracture.  POSTOPERATIVE DIAGNOSIS:  Severe osteoarthritis, left knee with varus deformity and flexion contracture.  PROCEDURE OPERATION:  Left total knee replacement (Sigma size 3 femur, tibia, 12.5 mm bearing with 35 mm all poly patella.  SURGEON:  Vangie Bicker, MD  ASSISTANTMarjo Bicker, PA.  ANESTHESIA:  Spinal with adductor block and local supplementation.  DESCRIPTION OF PROCEDURE:  Sterile prep and drape of the supine position and exsanguination of leg inflation to 350.  Medial parapatellar approach to the knee, made.  We cut an 11 mm 5 degree valgus cut on the femur followed by cutting about 3-4 mm below  the most diseased medial compartment.  Extension gap was measured at 12.5 mm.  Femur was sized to be a size 3, followed by placement of all-in-1 cutting block and appropriate degree of external rotation.  Anterior, posterior chamfer cuts were created.   The PCL was released.  Excess meniscal removed and osteophytes in the posterior aspect of the knee.  The keel was cut for the tibial baseplate.  Flexion gap prior to this was measured at 12.5 mm.  Trials were placed on the femur and tibia after the box  cut being made for the femur.  Patella was cut leaving about 17 mm of native patella for a 35 mm all poly trial.  All trials were placed.  Full extension was noted with resolution of the flexion contracture and a varus deformity with good stability and  flexion.  No tendency for bearing spinout or ligamentous imbalance was noted.  Final components were inserted with the cement in the doughy state, tibia followed by femur and patella.  Cement was allowed to harden with  the trial bearing.  Trial bearing  was removed.  Small bits of cement were removed from the posterior aspect of the knee.  Tourniquet was released under direct visualization.  No undue bleeding was noted.  We then infiltrated a mixture of Exparel and Marcaine with epinephrine into the  capsular and subcutaneous tissues.  Copious pulsatile lavage was used.  Final bearing was inserted.  All parameters deemed to be acceptable.  Closure was affected with #1 Ethibond, 0 and 2-0 Vicryl and skin clips.  A lightly compressive sterile dressing  and knee immobilizer applied, taken to recovery room in stable condition.  TN/NUANCE  D:11/16/2017 T:11/16/2017 JOB:[id number]

## 2017-11-16 NOTE — Evaluation (Signed)
Physical Therapy Evaluation Patient Details Name: Meredith Rose MRN: [id number] DOB: 1950-07-08 Today's Date: 11/16/2017   History of Present Illness  67 YO female s/p L TKR on 11/16/17. PMH includes ileus, HTN, anxiety, ruptured appendicitis with appendectomy 2013, ovarian cancer with radical hysterectomy, L and R knee arthroscopies, R TKR 2013.  Clinical Impression   Pt presents with mild L knee pain, difficulty performing bed mobility, decreased L knee ROM, and decreased tolerance for ambulation. Pt to benefit from acute PT to address deficits. Pt ambulated 60 ft with RW with min guard assist. PT to continue to follow acutely, will progress mobility as tolerated.     Follow Up Recommendations Follow surgeon's recommendation for DC plan and follow-up therapies;Supervision for mobility/OOB(HHPT )    Equipment Recommendations  None recommended by PT    Recommendations for Other Services       Precautions / Restrictions Precautions Precautions: Fall Required Braces or Orthoses: Knee Immobilizer - Left Knee Immobilizer - Left: On when out of bed or walking;Discontinue once straight leg raise with < 10 degree lag Restrictions Weight Bearing Restrictions: No LLE Weight Bearing: Weight bearing as tolerated      Mobility  Bed Mobility Overal bed mobility: Needs Assistance Bed Mobility: Supine to Sit     Supine to sit: Min assist;HOB elevated     General bed mobility comments: Min assist for LLE management and scooting to EOB. Verbal cuing for sequencing. Pt with initial complaint of mild dizziness upon sitting up, passed within seconds.   Transfers Overall transfer level: Needs assistance Equipment used: Rolling walker (2 wheeled) Transfers: Sit to/from Stand Sit to Stand: Min guard;From elevated surface         General transfer comment: Min guard for safety. Verbal cuing for hand placement on RW and bed when standing.   Ambulation/Gait Ambulation/Gait assistance:  Min guard;+2 safety/equipment Gait Distance (Feet): 60 Feet Assistive device: Rolling walker (2 wheeled) Gait Pattern/deviations: Step-to pattern;Decreased stride length;Decreased weight shift to left;Decreased stance time - left;Antalgic Gait velocity: decr   General Gait Details: Min guard for safety. Verbal cuing for sequencing, turning.   Stairs            Wheelchair Mobility    Modified Rankin (Stroke Patients Only)       Balance Overall balance assessment: Mild deficits observed, not formally tested                                           Pertinent Vitals/Pain Pain Assessment: 0-10 Pain Score: 2  Pain Location: L knee  Pain Descriptors / Indicators: Aching Pain Intervention(s): Limited activity within patient's tolerance;Repositioned;Monitored during session;Ice applied;Premedicated before session    Home Living Family/patient expects to be discharged to:: Private residence Living Arrangements: Spouse/significant other;Parent(Pt's mother lives with them, 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 Caregiver with mother during the day, will have caregiver in the evenings for the time being while pt is recovering from surgery. ) Available Help at Discharge: Family;Available PRN/intermittently Type of Home: House Home Access: Stairs to enter Entrance Stairs-Rails: None Entrance Stairs-Number of Steps: 1 Home Layout: One level;Laundry or work area in Char[employer] fundraiser area downstairs in basement, can do without it for a little while ) Home Equipment: Bedside commode;Walker - 2 wheels;Cane - single point      Prior Function Level of Independence: Independent  Hand Dominance   Dominant Hand: Right    Extremity/Trunk Assessment   Upper Extremity Assessment Upper Extremity Assessment: Overall WFL for tasks assessed    Lower Extremity Assessment Lower Extremity Assessment: Overall WFL for tasks assessed;LLE  deficits/detail LLE Deficits / Details: suspected post-surgical weakness; able to perform SLR with assist with KI donned, ankle pumps  LLE Sensation: WNL    Cervical / Trunk Assessment Cervical / Trunk Assessment: Normal  Communication   Communication: No difficulties  Cognition Arousal/Alertness: Awake/alert Behavior During Therapy: WFL for tasks assessed/performed Overall Cognitive Status: Within Functional Limits for tasks assessed                                        General Comments      Exercises Total Joint Exercises Ankle Circles/Pumps: AROM;Both;5 reps;Seated   Assessment/Plan    PT Assessment Patient needs continued PT services  PT Problem List Decreased strength;Pain;Decreased range of motion;Decreased activity tolerance;Decreased knowledge of use of DME;Decreased balance;Decreased safety awareness;Decreased mobility       PT Treatment Interventions DME instruction;Therapeutic activities;Gait training;Therapeutic exercise;Patient/family education;Stair training;Balance training;Functional mobility training    PT Goals (Current goals can be found in the Care Plan section)  Acute Rehab PT Goals PT Goal Formulation: With patient Time For Goal Achievement: 11/23/17 Potential to Achieve Goals: Good    Frequency 7X/week   Barriers to discharge        Co-evaluation               AM-PAC PT "6 Clicks" Daily Activity  Outcome Measure Difficulty turning over in bed (including adjusting bedclothes, sheets and blankets)?: Unable Difficulty moving from lying on back to sitting on the side of the bed? : Unable Difficulty sitting down on and standing up from a chair with arms (e.g., wheelchair, bedside commode, etc,.)?: Unable Help needed moving to and from a bed to chair (including a wheelchair)?: A Little Help needed walking in hospital room?: A Little Help needed climbing 3-5 steps with a railing? : A Little 6 Click Score: 12    End of  Session Equipment Utilized During Treatment: Gait belt Activity Tolerance: Patient tolerated treatment well Patient left: in chair;with chair alarm set;with call bell/phone within reach;with SCD's reapplied Nurse Communication: Mobility status PT Visit Diagnosis: Other abnormalities of gait and mobility (R26.89);Difficulty in walking, not elsewhere classified (R26.2)    Time: 8938-1017 PT Time Calculation (min) (ACUTE ONLY): 31 min   Charges:   PT Evaluation $PT Eval Low Complexity: 1 Low PT Treatments $Gait Training: 8-22 mins        Julien Girt, PT Acute Rehabilitation Services Pager [phone]  Office [phone]    D Gibbon 11/16/2017, 5:14 PM

## 2017-11-16 NOTE — Anesthesia Procedure Notes (Signed)
Spinal  Patient location during procedure: OR Start time: 11/16/2017 7:39 AM End time: 11/16/2017 7:42 AM Staffing Anesthesiologist: Montez Hageman, MD Resident/CRNA: Glory Buff, CRNA Performed: resident/CRNA  Preanesthetic Checklist Completed: patient identified, site marked, surgical consent, pre-op evaluation, timeout performed, IV checked, risks and benefits discussed and monitors and equipment checked Spinal Block Patient position: sitting Prep: DuraPrep Patient monitoring: heart rate, cardiac monitor, continuous pulse ox and blood pressure Approach: midline Location: L3-4 Injection technique: single-shot Needle Needle type: Pencan  Needle gauge: 24 G Needle length: 9 cm Needle insertion depth: 6 cm Assessment Sensory level: T6 Additional Notes Kit expiration date checked and verified.  Sterile prep and drape, skin local with 1% lidocaine, stick x 1, - heme, - paraesthesia, + CSF per and post injection.  Patient tolerated procedure well.

## 2017-11-16 NOTE — Transfer of Care (Signed)
Immediate Anesthesia Transfer of Care Note  Patient: Meredith Rose  Procedure(s) Performed: LEFT TOTAL KNEE ARTHROPLASTY (Left Knee)  Patient Location: PACU  Anesthesia Type:Spinal  Level of Consciousness: awake, alert  and oriented  Airway & Oxygen Therapy: Patient Spontanous Breathing and Patient connected to face mask oxygen  Post-op Assessment: Report given to RN and Post -op Vital signs reviewed and stable  Post vital signs: Reviewed and stable  Last Vitals:  Vitals Value Taken Time  BP 124/68 11/16/2017  9:55 AM  Temp    Pulse 51 11/16/2017  9:57 AM  Resp    SpO2 99 % 11/16/2017  9:57 AM  Vitals shown include unvalidated device data.  Last Pain:  Vitals:   11/16/17 0556  TempSrc: Oral      Patients Stated Pain Goal: 3 (23/01/72 0910)  Complications: No apparent anesthesia complications

## 2017-11-16 NOTE — Discharge Instructions (Signed)

## 2017-11-16 NOTE — Anesthesia Procedure Notes (Signed)
Date/Time: 11/16/2017 7:33 AM Performed by: Glory Buff, CRNA Oxygen Delivery Method: Simple face mask

## 2017-11-17 DIAGNOSIS — M1712 Unilateral primary osteoarthritis, left knee: Secondary | ICD-10-CM | POA: Diagnosis not present

## 2017-11-17 LAB — CBC
HCT: 37.9 % (ref 36.0–46.0)
Hemoglobin: 11.6 g/dL — ABNORMAL LOW (ref 12.0–15.0)
MCH: 27.2 pg (ref 26.0–34.0)
MCHC: 30.6 g/dL (ref 30.0–36.0)
MCV: 89 fL (ref 80.0–100.0)
Platelets: 270 10*3/uL (ref 150–400)
RBC: 4.26 MIL/uL (ref 3.87–5.11)
RDW: 14.4 % (ref 11.5–15.5)
WBC: 15.4 10*3/uL — AB (ref 4.0–10.5)
nRBC: 0 % (ref 0.0–0.2)

## 2017-11-17 LAB — BASIC METABOLIC PANEL
Anion gap: 9 (ref 5–15)
BUN: 13 mg/dL (ref 8–23)
CALCIUM: 8.8 mg/dL — AB (ref 8.9–10.3)
CO2: 26 mmol/L (ref 22–32)
CREATININE: 0.62 mg/dL (ref 0.44–1.00)
Chloride: 104 mmol/L (ref 98–111)
GFR calc non Af Amer: >60 mL/min (ref >60)
Glucose, Bld: 127 mg/dL — ABNORMAL HIGH (ref 70–99)
Potassium: 3.9 mmol/L (ref 3.5–5.1)
SODIUM: 139 mmol/L (ref 135–145)

## 2017-11-17 MED ORDER — BACLOFEN 10 MG PO TABS: 10.0000 mg | ORAL_TABLET | Freq: Three times a day (TID) | ORAL | 0 refills | Status: DC

## 2017-11-17 NOTE — Progress Notes (Signed)
Physical Therapy Treatment Patient Details Name: Meredith Rose MRN: [id number] DOB: [birth year] Today's Date: 11/17/2017    History of Present Illness 67 YO female s/p L TKR on 11/16/17. PMH includes ileus, HTN, anxiety, ruptured appendicitis with appendectomy 2013, ovarian cancer with radical hysterectomy, L and R knee arthroscopies, R TKR 2013.    PT Comments    Pt continues to progress with ambulation.  Spouse present to review stairs.  Also reviewed use of KI and home therex with written instruction provided.   Follow Up Recommendations  Follow surgeon's recommendation for DC plan and follow-up therapies;Supervision for mobility/OOB     Equipment Recommendations  None recommended by PT    Recommendations for Other Services       Precautions / Restrictions Precautions Precautions: Fall Required Braces or Orthoses: Knee Immobilizer - Left Knee Immobilizer - Left: On when out of bed or walking;Discontinue once straight leg raise with < 10 degree lag Restrictions Weight Bearing Restrictions: No LLE Weight Bearing: Weight bearing as tolerated    Mobility  Bed Mobility Overal bed mobility: Needs Assistance Bed Mobility: Supine to Sit     Supine to sit: Min guard        Transfers Overall transfer level: Needs assistance Equipment used: Rolling walker (2 wheeled) Transfers: Sit to/from Stand Sit to Stand: Supervision         General transfer comment: cues for LE management and use of UEs to self assist  Ambulation/Gait Ambulation/Gait assistance: Min guard;Supervision Gait Distance (Feet): 100 Feet Assistive device: Rolling walker (2 wheeled) Gait Pattern/deviations: Step-to pattern;Decreased stride length;Decreased weight shift to left;Decreased stance time - left;Antalgic Gait velocity: decr   General Gait Details: cues for posture and position from RW   Stairs Stairs: Yes Stairs assistance: Min assist Stair Management: No  rails;Backwards;Forwards;With walker Number of Stairs: 3 General stair comments: single step 3x - twice bkwd and once fwd.   Cues for sequence and foot/RW placement.  Spouse present and written information provided   Wheelchair Mobility    Modified Rankin (Stroke Patients Only)       Balance Overall balance assessment: Mild deficits observed, not formally tested                                          Cognition Arousal/Alertness: Awake/alert Behavior During Therapy: WFL for tasks assessed/performed Overall Cognitive Status: Within Functional Limits for tasks assessed                                        Exercises Total Joint Exercises Ankle Circles/Pumps: AROM;Both;5 reps;Seated Quad Sets: AROM;Both;10 reps;Supine Heel Slides: AAROM;Left;Supine;15 reps Straight Leg Raises: AAROM;AROM;Left;15 reps;Supine Goniometric ROM: AAROM L knee -10 - 60    General Comments        Pertinent Vitals/Pain Pain Assessment: 0-10 Pain Score: 3  Pain Location: L knee  Pain Descriptors / Indicators: Aching Pain Intervention(s): Limited activity within patient's tolerance;Monitored during session;Premedicated before session;Ice applied    Home Living                      Prior Function            PT Goals (current goals can now be found in the care plan section) Acute Rehab PT Goals Patient Stated Goal: Regain  IND PT Goal Formulation: With patient Time For Goal Achievement: 11/23/17 Potential to Achieve Goals: Good Progress towards PT goals: Progressing toward goals    Frequency    7X/week      PT Plan Current plan remains appropriate    Co-evaluation              AM-PAC PT "6 Clicks" Daily Activity  Outcome Measure  Difficulty turning over in bed (including adjusting bedclothes, sheets and blankets)?: A Lot Difficulty moving from lying on back to sitting on the side of the bed? : A Lot Difficulty sitting down on  and standing up from a chair with arms (e.g., wheelchair, bedside commode, etc,.)?: A Lot Help needed moving to and from a bed to chair (including a wheelchair)?: A Little Help needed walking in hospital room?: A Little Help needed climbing 3-5 steps with a railing? : A Little 6 Click Score: 15    End of Session Equipment Utilized During Treatment: Gait belt Activity Tolerance: Patient tolerated treatment well Patient left: in chair;with chair alarm set;with call bell/phone within reach;with family/visitor present Nurse Communication: Mobility status PT Visit Diagnosis: Other abnormalities of gait and mobility (R26.89);Difficulty in walking, not elsewhere classified (R26.2)     Time: 7711-6579 PT Time Calculation (min) (ACUTE ONLY): 29 min  Charges:  $Gait Training: 8-22 mins $Therapeutic Exercise: 8-22 mins $Therapeutic Activity: 8-22 mins                     Debe Coder PT Acute Rehabilitation Services Pager [phone] Office [phone]    , 11/17/2017, 12:52 PM

## 2017-11-17 NOTE — Evaluation (Signed)
Occupational Therapy Evaluation Patient Details Name: Meredith Rose MRN: [id number] DOB: 1950-10-04 Today's Date: 11/17/2017    History of Present Illness 67 YO female s/p L TKR on 11/16/17. PMH includes ileus, HTN, anxiety, ruptured appendicitis with appendectomy 2013, ovarian cancer with radical hysterectomy, L and R knee arthroscopies, R TKR 2013.   Clinical Impression   Pt doing well and likely for dc home today. Feel pt ok to dc from OT standpoint but if here after today, would continue to work toward goals. Educated pt on tub transfers and safety with appropriate DME for tub and 3in1. Husband present for session also. Pt will benefit from OT to increase ADL independence. Will follow.    Follow Up Recommendations  No OT follow up    Equipment Recommendations  3 in 1 bedside commode    Recommendations for Other Services       Precautions / Restrictions Precautions Precautions: Fall Required Braces or Orthoses: Knee Immobilizer - Left Knee Immobilizer - Left: On when out of bed or walking;Discontinue once straight leg raise with < 10 degree lag Restrictions Weight Bearing Restrictions: No LLE Weight Bearing: Weight bearing as tolerated      Mobility Bed Mobility      General bed mobility comments: in chair.   Transfers Overall transfer level: Needs assistance Equipment used: Rolling walker (2 wheeled) Transfers: Sit to/from Stand Sit to Stand: Min guard         General transfer comment: cues for hand placement and LE management.    Balance Overall balance assessment: Mild deficits observed, not formally tested                                         ADL either performed or assessed with clinical judgement   ADL Overall ADL's : Needs assistance/impaired Eating/Feeding: Independent;Sitting   Grooming: Wash/dry hands;Min guard;Standing   Upper Body Bathing: Set up;Sitting   Lower Body Bathing: Moderate assistance;Sit to/from stand    Upper Body Dressing : Set up;Sitting   Lower Body Dressing: Moderate assistance;Sit to/from stand   Toilet Transfer: Min guard;Ambulation;RW;Minimal assistance;BSC   Toileting- Water quality scientist and Hygiene: Min guard;Sit to/from stand         General ADL Comments: Pt has a tub at home and declines need for ordering tub seat at hospital. She states she would like to explore her options of seats and have husband obtain one. Educated on tubbench veresus seat option and sitting and pivoting LEs over tub versus stepping in tub. Educated on need to be able to SLR with L LE before attempting step in tub. Educated pt and spouse on KI wear and how to don/doff and when to wear. Also educated pt on 3in1 and appropriate height. Husband can assist with LB self care tasks at home. Cues for correct walker sequence and safety with functional transfers.      Vision Patient Visual Report: No change from baseline       Perception     Praxis      Pertinent Vitals/Pain Pain Assessment: 0-10 Pain Score: 3  Pain Location: L knee  Pain Descriptors / Indicators: Aching Pain Intervention(s): Limited activity within patient's tolerance;Monitored during session;Premedicated before session;Ice applied     Hand Dominance Right   Extremity/Trunk Assessment Upper Extremity Assessment Upper Extremity Assessment: Overall WFL for tasks assessed           Communication  Communication Communication: No difficulties   Cognition Arousal/Alertness: Awake/alert Behavior During Therapy: WFL for tasks assessed/performed Overall Cognitive Status: Within Functional Limits for tasks assessed                                     General Comments       Exercises    Shoulder Instructions      Home Living Family/patient expects to be discharged to:: Private residence Living Arrangements: Spouse/significant other;Parent(Pt's mother lives with them, 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 Caregiver with mother during the day, will have caregiver in the evenings for the time being while pt is recovering from surgery. ) Available Help at Discharge: Family;Available PRN/intermittently Type of Home: House Home Access: Stairs to enter [employer] of Steps: 1 Entrance Stairs-Rails: None Home Layout: One level;Laundry or work area in Charity fundraiser area downstairs in basement, can do without it for a little while )     Bathroom Shower/Tub: Teacher, early years/pre: [place]: Environmental consultant - 2 wheels;Cane - single point   Additional Comments: 3in1 ordered.       Prior Functioning/Environment Level of Independence: Independent                 OT Problem List: Decreased strength;Decreased activity tolerance;Decreased knowledge of use of DME or AE      OT Treatment/Interventions: Self-care/ADL training;DME and/or AE instruction;Therapeutic activities;Patient/family education    OT Goals(Current goals can be found in the care plan section) Acute Rehab OT Goals Patient Stated Goal: return to independence OT Goal Formulation: With patient/family Time For Goal Achievement: 11/24/17 Potential to Achieve Goals: Good  OT Frequency: Min 2X/week   Barriers to D/C:            Co-evaluation              AM-PAC PT "6 Clicks" Daily Activity     Outcome Measure Help from another person eating meals?: None Help from another person taking care of personal grooming?: A Little Help from another person toileting, which includes using toliet, bedpan, or urinal?: A Little Help from another person bathing (including washing, rinsing, drying)?: A Little Help from another person to put on and taking off regular upper body clothing?: None Help from another person to put on and taking off regular lower body clothing?: A Lot 6 Click Score: 19   End of Session Equipment Utilized During Treatment: Left knee immobilizer;Gait belt;Rolling  walker  Activity Tolerance: Patient tolerated treatment well Patient left: in chair;with call bell/phone within reach;with family/visitor present  OT Visit Diagnosis: Muscle weakness (generalized) (M62.81)                Time: 2876-8115 OT Time Calculation (min): 26 min Charges:  OT Evaluation $OT Eval Low Complexity: 1 Low OT Treatments $Therapeutic Activity: 8-22 mins    Jae Dire  OTR/L Acute Rehab [phone] 11/17/2017, 1:42 PM

## 2017-11-17 NOTE — Progress Notes (Signed)
Physical Therapy Treatment Patient Details Name: Meredith Rose MRN: [id number] DOB: 01/29/1951 Today's Date: 11/17/2017    History of Present Illness 67 YO female s/p L TKR on 11/16/17. PMH includes ileus, HTN, anxiety, ruptured appendicitis with appendectomy 2013, ovarian cancer with radical hysterectomy, L and R knee arthroscopies, R TKR 2013.    PT Comments    Pt motivated and progressing well with mobility.   Follow Up Recommendations  Follow surgeon's recommendation for DC plan and follow-up therapies;Supervision for mobility/OOB     Equipment Recommendations  None recommended by PT    Recommendations for Other Services       Precautions / Restrictions Precautions Precautions: Fall Required Braces or Orthoses: Knee Immobilizer - Left Knee Immobilizer - Left: On when out of bed or walking;Discontinue once straight leg raise with < 10 degree lag(Pt performed IND SLR this am) Restrictions Weight Bearing Restrictions: No LLE Weight Bearing: Weight bearing as tolerated    Mobility  Bed Mobility Overal bed mobility: Needs Assistance Bed Mobility: Supine to Sit     Supine to sit: Min guard        Transfers Overall transfer level: Needs assistance Equipment used: Rolling walker (2 wheeled) Transfers: Sit to/from Stand Sit to Stand: Min guard;Supervision         General transfer comment: Min guard for safety. Verbal cuing for hand placement on RW and bed when standing.   Ambulation/Gait Ambulation/Gait assistance: Min guard;Supervision Gait Distance (Feet): 170 Feet Assistive device: Rolling walker (2 wheeled) Gait Pattern/deviations: Step-to pattern;Decreased stride length;Decreased weight shift to left;Decreased stance time - left;Antalgic Gait velocity: decr   General Gait Details: Min guard for safety. Verbal cuing for sequencing, turning.    Stairs             Wheelchair Mobility    Modified Rankin (Stroke Patients Only)        Balance Overall balance assessment: Mild deficits observed, not formally tested                                          Cognition Arousal/Alertness: Awake/alert Behavior During Therapy: WFL for tasks assessed/performed Overall Cognitive Status: Within Functional Limits for tasks assessed                                        Exercises Total Joint Exercises Ankle Circles/Pumps: AROM;Both;5 reps;Seated Quad Sets: AROM;Both;10 reps;Supine Heel Slides: AAROM;Left;Supine;15 reps Straight Leg Raises: AAROM;AROM;Left;15 reps;Supine Goniometric ROM: AAROM L knee -10 - 60    General Comments        Pertinent Vitals/Pain Pain Assessment: 0-10 Pain Score: 4  Pain Location: L knee  Pain Descriptors / Indicators: Aching Pain Intervention(s): Limited activity within patient's tolerance;Monitored during session;Premedicated before session;Ice applied    Home Living                      Prior Function            PT Goals (current goals can now be found in the care plan section) Acute Rehab PT Goals PT Goal Formulation: With patient Time For Goal Achievement: 11/23/17 Potential to Achieve Goals: Good Progress towards PT goals: Progressing toward goals    Frequency    7X/week      PT Plan Current plan remains  appropriate    Co-evaluation              AM-PAC PT "6 Clicks" Daily Activity  Outcome Measure  Difficulty turning over in bed (including adjusting bedclothes, sheets and blankets)?: A Lot Difficulty moving from lying on back to sitting on the side of the bed? : A Lot Difficulty sitting down on and standing up from a chair with arms (e.g., wheelchair, bedside commode, etc,.)?: A Lot Help needed moving to and from a bed to chair (including a wheelchair)?: A Little Help needed walking in hospital room?: A Little Help needed climbing 3-5 steps with a railing? : A Little 6 Click Score: 15    End of Session Equipment  Utilized During Treatment: Gait belt Activity Tolerance: Patient tolerated treatment well Patient left: in chair;with chair alarm set;with call bell/phone within reach;with family/visitor present Nurse Communication: Mobility status PT Visit Diagnosis: Other abnormalities of gait and mobility (R26.89);Difficulty in walking, not elsewhere classified (R26.2)     Time: 1610-9604 PT Time Calculation (min) (ACUTE ONLY): 34 min  Charges:  $Gait Training: 8-22 mins $Therapeutic Exercise: 8-22 mins                     [place] Pager [phone] Office [phone]    , 11/17/2017, 12:45 PM

## 2017-11-17 NOTE — Discharge Summary (Addendum)
Physician Discharge Summary  Patient ID: Meredith Rose MRN: [id number] DOB/AGE: [birth year] 67 y.o.  Admit date: 11/16/2017 Discharge date: 11/17/2017  Admission Diagnoses:  Left knee primary localized osteoarthritis  Discharge Diagnoses:  Active Problems:   S/P knee replacement   Past Medical History:  Diagnosis Date  . Anxiety   . Arthritis    OA  . Congestion of throat    SINUSES, NONPRODUCTIVE COUGH  . Hypertension    takes meds daily  . Hypothyroidism    takes meds daily  . Ovarian cancer ([place]) 2002  . Ruptured appendicitis 06/03/11  . 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 female who was admitted 11/16/2017 with a diagnosis of <principal problem not specified> and went to the operating room on 11/16/2017 and underwent the above named procedures.    She was given perioperative antibiotics:  Anti-infectives (From admission, onward)   Start     Dose/Rate Route Frequency Ordered Stop   11/16/17 1400  ceFAZolin (ANCEF) IVPB 1 g/50 mL premix     1 g 100 mL/hr over 30 Minutes Intravenous Every 6 hours 11/16/17 1147 11/16/17 2110   11/16/17 0600  ceFAZolin (ANCEF) IVPB 2g/100 mL premix     2 g 200 mL/hr over 30 Minutes Intravenous On call to O.R. 11/16/17 4332 11/16/17 0813    .  She was given sequential compression devices, early ambulation, and aspirin for DVT prophylaxis.  She benefited maximally from the hospital stay and there were no complications.    Recent vital signs:  Vitals:   11/17/17 0509 11/17/17 0855  BP: (!) 115/54 (!) 112/56  Pulse: (!) 52 (!) 55  Resp:  15  Temp: (!) 97.5 F (36.4 C) 97.8 F (36.6 C)  SpO2: 95% 94%    Recent laboratory studies:  Lab Results  Component Value Date   HGB 11.6 (L) 11/17/2017   HGB 13.0 11/08/2017   HGB 9.7 (L)  09/25/2011   Lab Results  Component Value Date   WBC 15.4 (H) 11/17/2017   PLT 270 11/17/2017   Lab Results  Component Value Date   INR 0.94 11/08/2017   Lab Results  Component Value Date   NA 139 11/17/2017   K 3.9 11/17/2017   CL 104 11/17/2017   CO2 26 11/17/2017   BUN 13 11/17/2017   CREATININE 0.62 11/17/2017   GLUCOSE 127 (H) 11/17/2017    Discharge Medications:   Allergies as of 11/17/2017      Reactions   Tape Itching   BANDAID OR PAPER TAPE IF LEFT ON TOO LONG       Medication List    TAKE these medications   aspirin 81 MG tablet Take 1 tablet (81 mg total) by mouth 2 (two) times daily. Take x30 days post op   baclofen 10 MG tablet Commonly known as:  LIORESAL Take 1 tablet (10 mg total) by mouth 3 (three) times daily. As needed for muscle spasm   hydrochlorothiazide 25 MG tablet Commonly known as:  HYDRODIURIL Take 25 mg by mouth daily.   HYDROcodone-acetaminophen 7.5-325 MG tablet Commonly known as:  NORCO Take 1 tablet by mouth every 4 (four) hours as needed for moderate pain.   KLOR-CON M20 20 MEQ tablet Generic drug:  potassium chloride SA Take 20 mEq by mouth 2 (two) times daily.  levothyroxine 100 MCG tablet Commonly known as:  SYNTHROID, LEVOTHROID Take 100 mcg by mouth daily before breakfast.   losartan 100 MG tablet Commonly known as:  COZAAR Take 100 mg by mouth daily.   MUCINEX ALLERGY PO Take by mouth. FAST MAX, COLD, FLU AND SORE THROAT   propranolol 20 MG tablet Commonly known as:  INDERAL Take 20 mg by mouth daily.   sertraline 100 MG tablet Commonly known as:  ZOLOFT Take 100 mg by mouth at bedtime.   simvastatin 20 MG tablet Commonly known as:  ZOCOR Take 20 mg by mouth at bedtime.   SYSTANE 0.4-0.3 % Soln Generic drug:  Polyethyl Glycol-Propyl Glycol Place 1 drop into both eyes every morning.            Durable Medical Equipment  (From admission, onward)         Start     Ordered   11/16/17 1148  DME  Walker rolling  Once    Question:  Patient needs a walker to treat with the following condition  Answer:  Primary localized osteoarthritis of left knee   11/16/17 1147   11/16/17 1148  DME 3 n 1  Once     11/16/17 1147          Diagnostic Studies: Dg Chest 2 View  Result Date: 11/08/2017 CLINICAL DATA:  Preoperative evaluation for upcoming knee replacement EXAM: CHEST - 2 VIEW COMPARISON:  09/19/2011 FINDINGS: Cardiac shadow is stable at the upper limits of normal. The lungs are well aerated bilaterally. Mild interstitial changes are seen without focal infiltrate. No effusion is seen. No bony abnormality is noted. IMPRESSION: Mild interstitial changes without acute infiltrate. This may be related to chronic bronchitis. Electronically Signed   By: Inez Catalina M.D.   On: 11/08/2017 15:39    Disposition: Discharge disposition: 01-Home or [place]    Earlie Server, MD. Schedule an appointment as soon as possible for a visit in 2 weeks.   Specialty:  Orthopedic Surgery Contact information: [address] [address] [phone]        Home, [facility] Follow up.   Specialty:  [place] Why:  physical therapy Contact information: [place] [address] [phone]            Signed: Johnny Bridge 11/17/2017, 12:48 PM

## 2017-11-17 NOTE — Plan of Care (Signed)
Pt alert and oriented, resting with husband at the bedside.  Plan to d/c home today per MD order. RN will monitor.

## 2017-11-17 NOTE — Progress Notes (Addendum)
Patient ID: Meredith Rose, female   DOB: October 16, 1950, 67 y.o.   MRN: [id number]     Subjective:  Patient reports pain as mild.  Patient in the chair and doing well.  Denies any CP or SOB  Objective:   VITALS:   Vitals:   11/16/17 2122 11/17/17 0257 11/17/17 0509 11/17/17 0855  BP: (!) 101/52 110/66 (!) 115/54 (!) 112/56  Pulse: (!) 58 (!) 56 (!) 52 (!) 55  Resp: 15   15  Temp: (!) 97.4 F (36.3 C) (!) 97.5 F (36.4 C) (!) 97.5 F (36.4 C) 97.8 F (36.6 C)  TempSrc: Oral Oral Oral Oral  SpO2: 94% 96% 95% 94%  Weight:      Height:        ABD soft Sensation intact distally Dorsiflexion/Plantar flexion intact Incision: dressing C/D/I and no drainage   Lab Results  Component Value Date   WBC 15.4 (H) 11/17/2017   HGB 11.6 (L) 11/17/2017   HCT 37.9 11/17/2017   MCV 89.0 11/17/2017   PLT 270 11/17/2017   BMET    Component Value Date/Time   NA 139 11/17/2017 0444   K 3.9 11/17/2017 0444   CL 104 11/17/2017 0444   CO2 26 11/17/2017 0444   GLUCOSE 127 (H) 11/17/2017 0444   BUN 13 11/17/2017 0444   CREATININE 0.62 11/17/2017 0444   CALCIUM 8.8 (L) 11/17/2017 0444   GFRNONAA >60 11/17/2017 0444   GFRAA >60 11/17/2017 0444     Assessment/Plan: 1 Day Post-Op   Active Problems:   S/P knee replacement   Advance diet Up with therapy Discharge home with home health WBAT Dry dressing PRN Follow up with Dr French Ana as scheduled    Rande Brunt, BRANDON 11/17/2017, 11:56 AM  Discussed and agree with above.  Added baclofen to dc meds per request.  Marchia Bond, MD Cell [phone]

## 2017-11-17 NOTE — Progress Notes (Signed)
Discharge paperwork discussed with pt and husband at the bedside.  They demonstrated understanding. Pt was escorted in stable condition by wheelchair to main lobby.  

## 2017-11-20 ENCOUNTER — Encounter (HOSPITAL_COMMUNITY): Payer: Self-pay | Admitting: Orthopedic Surgery

## 2019-07-10 ENCOUNTER — Other Ambulatory Visit: Payer: Self-pay | Admitting: Physician Assistant

## 2019-07-10 ENCOUNTER — Ambulatory Visit: Payer: Self-pay | Admitting: Physician Assistant

## 2019-07-10 NOTE — H&P (Signed)
Meredith Rose is an 69 y.o. female.   Chief Complaint: left knee pain HPI: Patient of Dr. French Ana, hx of Left Total Knee Arthroplasty in Oct 2019 who has done extremely well with it, was seen in our outpatient office today with c/o severe left knee pain without new injury or trauma onset approx 2 days ago.  No precipitating illness/infection, denies fevers, chills, sweats, erythema of knee, urinary symptoms, cough, chest pain, SOB, or recent dental work.  She has significant 8/10 pain worse with weight bearing/walking diffusely about the left knee with some swelling.  During office visit we obtained some baseline blood work (CBC with diff, sed rate, CRP) significant for WBC 14.1 with Neutrophils 10490, Monocytes 1311), also aspirated the left knee for approx 17ml of murky dark yellow/Mathews fluid to obtain (gm stain, cell count, culture, crystals significant for total nucleated cell count 68810 and neutrophils 98, no organisms on initial culture or gm stain, moderated PMN Leukocytes, no crystals).  CRP/sed rate still pending.    Past Medical History:  Diagnosis Date   Anxiety    Arthritis    OA   Congestion of throat    SINUSES, NONPRODUCTIVE COUGH   Hypertension    takes meds daily   Hypothyroidism    takes meds daily   Ovarian cancer ([place]) 2002   Ruptured appendicitis 06/03/11   Squamous cell carcinoma 10/2009   right shin    Past Surgical History:  Procedure Laterality Date   ABDOMINAL HYSTERECTOMY  2002   "radical; for ovarian cancer"   ABDOMINAL WOUND DEHISCENCE  2002-2003   "twice"   APPENDECTOMY  06/05/2011   Procedure: APPENDECTOMY;  Surgeon: Gwenyth Ober, MD;  Location: [facility];  Service: General;;   CARPAL TUNNEL RELEASE Right AFTER 2013   KNEE ARTHROSCOPY  ~ 2007   left   KNEE ARTHROSCOPY  12/2010   right   SQUAMOUS CELL CARCINOMA EXCISION  10/2009   right shin   TOTAL KNEE ARTHROPLASTY  09/22/2011   Procedure: TOTAL KNEE ARTHROPLASTY;  Surgeon: Yvette Rack., MD;  Location: [facility];  Service: Orthopedics;  Laterality: Right;   TOTAL KNEE ARTHROPLASTY Left 11/16/2017   Procedure: LEFT TOTAL KNEE ARTHROPLASTY;  Surgeon: Earlie Server, MD;  Location: [facility];  Service: Orthopedics;  Laterality: Left;    No family history on file. Social History:  reports that she has never smoked. She has never used smokeless tobacco. She reports that she does not drink alcohol and does not use drugs.  Allergies:  Allergies  Allergen Reactions   Tape Itching    BANDAID OR PAPER TAPE IF LEFT ON TOO LONG     (Not in a hospital admission)   No results found for this or any previous visit (from the past 48 hour(s)). No results found.  Review of Systems  Constitutional: Negative for chills, diaphoresis, fatigue and fever.  Respiratory: Negative for chest tightness and shortness of breath.   Cardiovascular: Negative for chest pain.  Gastrointestinal: Positive for nausea. Negative for abdominal pain and vomiting.  Endocrine: Negative for polyuria.  Genitourinary: Negative for difficulty urinating, dysuria, flank pain, hematuria and pelvic pain.  Musculoskeletal: Positive for arthralgias and joint swelling.  Skin: Negative for color change, rash and wound.  Allergic/Immunologic: Negative for immunocompromised state.  All other systems reviewed and are negative.   There were no vitals taken for this visit. Physical Exam  Constitutional: She is oriented to person, place, and time.  Non-toxic appearance. She does not  appear ill. No distress.  HENT:  Head: Normocephalic and atraumatic.  Nose: No congestion.  Eyes: Conjunctivae are normal.  Cardiovascular: Normal rate and normal pulses.  Respiratory: Effort normal. No respiratory distress. She has no wheezes.  GI: Normal appearance. She exhibits no distension. There is no abdominal tenderness.  Musculoskeletal:     Left knee: Swelling and effusion present. No erythema. Tenderness present.      Instability Tests: Anterior drawer test negative. Posterior drawer test negative.     Comments: Antalgic gait favoring left knee   Lymphadenopathy:    She has no cervical adenopathy.  Neurological: She is alert and oriented to person, place, and time. Gait abnormal.  Skin: Skin is warm and dry. She is not diaphoretic. No erythema.  Psychiatric: Her behavior is normal. Mood normal.     Assessment/Plan Septic arthritis left total knee  Dr. French Ana had a long discussion with patient regarding need for admission to hospital for irrigation and debridement with possible poly exchange possible hardware/implant removal/possible use of antibiotic beads or spacer.  IV antibiotics and ID consult, pain control.  We will plan on direct admit Friday 07/11/19 with planned surgery to follow in a timely manner after screened appropriately preoperatively.  We will contact the patient directly with instructions regarding admission ASAP.    Chriss Czar, PA-C 07/10/2019, 9:02 PM

## 2019-07-10 NOTE — H&P (Deleted)
  The note originally documented on this encounter has been moved the the encounter in which it belongs.  

## 2019-07-11 ENCOUNTER — Inpatient Hospital Stay (HOSPITAL_COMMUNITY)
Admission: AD | Admit: 2019-07-11 | Discharge: 2019-07-16 | DRG: 486 | Disposition: A | Discharge status: Home / Self Care | Payer: Medicare Other | Attending: Orthopedic Surgery | Admitting: Orthopedic Surgery

## 2019-07-11 ENCOUNTER — Inpatient Hospital Stay (HOSPITAL_COMMUNITY): Payer: Medicare Other

## 2019-07-11 ENCOUNTER — Other Ambulatory Visit: Payer: Self-pay | Admitting: Orthopedic Surgery

## 2019-07-11 ENCOUNTER — Other Ambulatory Visit: Payer: Self-pay

## 2019-07-11 ENCOUNTER — Ambulatory Visit: Payer: Self-pay | Admitting: Physician Assistant

## 2019-07-11 DIAGNOSIS — Z9049 Acquired absence of other specified parts of digestive tract: Secondary | ICD-10-CM | POA: Diagnosis not present

## 2019-07-11 DIAGNOSIS — Z8543 Personal history of malignant neoplasm of ovary: Secondary | ICD-10-CM

## 2019-07-11 DIAGNOSIS — L237 Allergic contact dermatitis due to plants, except food: Secondary | ICD-10-CM | POA: Diagnosis not present

## 2019-07-11 DIAGNOSIS — T8453XA Infection and inflammatory reaction due to internal right knee prosthesis, initial encounter: Secondary | ICD-10-CM | POA: Diagnosis not present

## 2019-07-11 DIAGNOSIS — G8929 Other chronic pain: Secondary | ICD-10-CM | POA: Diagnosis present

## 2019-07-11 DIAGNOSIS — Y831 Surgical operation with implant of artificial internal device as the cause of abnormal reaction of the patient, or of later complication, without mention of misadventure at the time of the procedure: Secondary | ICD-10-CM | POA: Diagnosis present

## 2019-07-11 DIAGNOSIS — I10 Essential (primary) hypertension: Secondary | ICD-10-CM | POA: Diagnosis present

## 2019-07-11 DIAGNOSIS — M009 Pyogenic arthritis, unspecified: Secondary | ICD-10-CM

## 2019-07-11 DIAGNOSIS — F419 Anxiety disorder, unspecified: Secondary | ICD-10-CM | POA: Diagnosis present

## 2019-07-11 DIAGNOSIS — Z85828 Personal history of other malignant neoplasm of skin: Secondary | ICD-10-CM | POA: Diagnosis not present

## 2019-07-11 DIAGNOSIS — Z96653 Presence of artificial knee joint, bilateral: Secondary | ICD-10-CM | POA: Diagnosis present

## 2019-07-11 DIAGNOSIS — E039 Hypothyroidism, unspecified: Secondary | ICD-10-CM | POA: Diagnosis present

## 2019-07-11 DIAGNOSIS — M00862 Arthritis due to other bacteria, left knee: Secondary | ICD-10-CM | POA: Diagnosis not present

## 2019-07-11 DIAGNOSIS — Z6841 Body Mass Index (BMI) 40.0 and over, adult: Secondary | ICD-10-CM | POA: Diagnosis not present

## 2019-07-11 DIAGNOSIS — D62 Acute posthemorrhagic anemia: Secondary | ICD-10-CM | POA: Diagnosis not present

## 2019-07-11 DIAGNOSIS — Z01818 Encounter for other preprocedural examination: Secondary | ICD-10-CM

## 2019-07-11 DIAGNOSIS — Z20822 Contact with and (suspected) exposure to covid-19: Secondary | ICD-10-CM | POA: Diagnosis present

## 2019-07-11 DIAGNOSIS — Z90710 Acquired absence of both cervix and uterus: Secondary | ICD-10-CM | POA: Diagnosis not present

## 2019-07-11 DIAGNOSIS — T8454XA Infection and inflammatory reaction due to internal left knee prosthesis, initial encounter: Principal | ICD-10-CM | POA: Diagnosis present

## 2019-07-11 DIAGNOSIS — B954 Other streptococcus as the cause of diseases classified elsewhere: Secondary | ICD-10-CM | POA: Diagnosis present

## 2019-07-11 DIAGNOSIS — E669 Obesity, unspecified: Secondary | ICD-10-CM | POA: Diagnosis present

## 2019-07-11 LAB — COMPREHENSIVE METABOLIC PANEL
ALT: 19 U/L (ref 0–44)
AST: 22 U/L (ref 15–41)
Albumin: 4 g/dL (ref 3.5–5.0)
Alkaline Phosphatase: 80 U/L (ref 38–126)
Anion gap: 11 (ref 5–15)
BUN: 13 mg/dL (ref 8–23)
CO2: 27 mmol/L (ref 22–32)
Calcium: 9.2 mg/dL (ref 8.9–10.3)
Chloride: 98 mmol/L (ref 98–111)
Creatinine, Ser: 0.77 mg/dL (ref 0.44–1.00)
GFR calc Af Amer: >60 mL/min (ref >60)
GFR calc non Af Amer: >60 mL/min (ref >60)
Glucose, Bld: 108 mg/dL — ABNORMAL HIGH (ref 70–99)
Potassium: 3.5 mmol/L (ref 3.5–5.1)
Sodium: 136 mmol/L (ref 135–145)
Total Bilirubin: 1.1 mg/dL (ref 0.3–1.2)
Total Protein: 7.6 g/dL (ref 6.5–8.1)

## 2019-07-11 LAB — MRSA PCR SCREENING: MRSA by PCR: NEGATIVE

## 2019-07-11 LAB — CBC WITH DIFFERENTIAL/PLATELET
Abs Immature Granulocytes: 0.05 10*3/uL (ref 0.00–0.07)
Basophils Absolute: 0.1 10*3/uL (ref 0.0–0.1)
Basophils Relative: 1 %
Eosinophils Absolute: 0.3 10*3/uL (ref 0.0–0.5)
Eosinophils Relative: 2 %
HCT: 39.9 % (ref 36.0–46.0)
Hemoglobin: 12.8 g/dL (ref 12.0–15.0)
Immature Granulocytes: 0 %
Lymphocytes Relative: 14 %
Lymphs Abs: 1.7 10*3/uL (ref 0.7–4.0)
MCH: 27.3 pg (ref 26.0–34.0)
MCHC: 32.1 g/dL (ref 30.0–36.0)
MCV: 85.1 fL (ref 80.0–100.0)
Monocytes Absolute: 0.9 10*3/uL (ref 0.1–1.0)
Monocytes Relative: 8 %
Neutro Abs: 9.1 10*3/uL — ABNORMAL HIGH (ref 1.7–7.7)
Neutrophils Relative %: 75 %
Platelets: 299 10*3/uL (ref 150–400)
RBC: 4.69 MIL/uL (ref 3.87–5.11)
RDW: 14.5 % (ref 11.5–15.5)
WBC: 12.1 10*3/uL — ABNORMAL HIGH (ref 4.0–10.5)
nRBC: 0 % (ref 0.0–0.2)

## 2019-07-11 LAB — URINALYSIS, ROUTINE W REFLEX MICROSCOPIC
Bilirubin Urine: NEGATIVE
Glucose, UA: NEGATIVE mg/dL
Ketones, ur: NEGATIVE mg/dL
Nitrite: NEGATIVE
Protein, ur: NEGATIVE mg/dL
Specific Gravity, Urine: 1.017 (ref 1.005–1.030)
pH: 6 (ref 5.0–8.0)

## 2019-07-11 LAB — TYPE AND SCREEN
ABO/RH(D): A POS
Antibody Screen: NEGATIVE

## 2019-07-11 LAB — PROTIME-INR
INR: 1.1 (ref 0.8–1.2)
Prothrombin Time: 13.7 seconds (ref 11.4–15.2)

## 2019-07-11 LAB — APTT: aPTT: 33 seconds (ref 24–36)

## 2019-07-11 LAB — SARS CORONAVIRUS 2 BY RT PCR (HOSPITAL ORDER, PERFORMED IN ~~LOC~~ HOSPITAL LAB): SARS Coronavirus 2: NEGATIVE

## 2019-07-11 MED ORDER — VANCOMYCIN HCL 2000 MG/400ML IV SOLN
2000.0000 mg | Freq: Once | INTRAVENOUS | Status: AC
  Administered 2019-07-11: 2000 mg via INTRAVENOUS
  Filled 2019-07-11: qty 400

## 2019-07-11 MED ORDER — HYDROCHLOROTHIAZIDE 25 MG PO TABS
25.0000 mg | ORAL_TABLET | Freq: Every day | ORAL | Status: DC
  Administered 2019-07-12 – 2019-07-16 (×5): 25 mg via ORAL
  Filled 2019-07-11 (×6): qty 1

## 2019-07-11 MED ORDER — SIMVASTATIN 20 MG PO TABS
20.0000 mg | ORAL_TABLET | Freq: Every day | ORAL | Status: DC
  Administered 2019-07-11 – 2019-07-15 (×5): 20 mg via ORAL
  Filled 2019-07-11 (×5): qty 1

## 2019-07-11 MED ORDER — LEVOTHYROXINE SODIUM 100 MCG PO TABS
100.0000 ug | ORAL_TABLET | Freq: Every day | ORAL | Status: DC
  Administered 2019-07-12 – 2019-07-16 (×5): 100 ug via ORAL
  Filled 2019-07-11 (×5): qty 1

## 2019-07-11 MED ORDER — SODIUM CHLORIDE 0.9 % IV SOLN: INTRAVENOUS | Status: DC

## 2019-07-11 MED ORDER — VANCOMYCIN HCL 750 MG/150ML IV SOLN
750.0000 mg | Freq: Two times a day (BID) | INTRAVENOUS | Status: DC
  Administered 2019-07-12 (×2): 750 mg via INTRAVENOUS
  Filled 2019-07-11 (×3): qty 150

## 2019-07-11 MED ORDER — HYDROCORTISONE 1 % EX CREA
TOPICAL_CREAM | Freq: Three times a day (TID) | CUTANEOUS | Status: DC | PRN
  Filled 2019-07-11 (×2): qty 28

## 2019-07-11 MED ORDER — PROPRANOLOL HCL 20 MG PO TABS
20.0000 mg | ORAL_TABLET | Freq: Every day | ORAL | Status: DC
  Administered 2019-07-12 – 2019-07-16 (×5): 20 mg via ORAL
  Filled 2019-07-11 (×5): qty 1

## 2019-07-11 MED ORDER — SODIUM CHLORIDE 0.9 % IV SOLN
2.0000 g | Freq: Every day | INTRAVENOUS | Status: DC
  Administered 2019-07-11 – 2019-07-16 (×6): 2 g via INTRAVENOUS
  Filled 2019-07-11 (×6): qty 2

## 2019-07-11 MED ORDER — HYDROCODONE-ACETAMINOPHEN 5-325 MG PO TABS
1.0000 | ORAL_TABLET | ORAL | Status: DC | PRN
  Administered 2019-07-11 – 2019-07-12 (×2): 1 via ORAL
  Filled 2019-07-11 (×3): qty 1

## 2019-07-11 MED ORDER — AMLODIPINE BESYLATE 5 MG PO TABS
5.0000 mg | ORAL_TABLET | Freq: Every day | ORAL | Status: DC
  Administered 2019-07-12 – 2019-07-16 (×5): 5 mg via ORAL
  Filled 2019-07-11 (×6): qty 1

## 2019-07-11 MED ORDER — DOCUSATE SODIUM 100 MG PO CAPS: 100.0000 mg | ORAL_CAPSULE | Freq: Two times a day (BID) | ORAL | Status: DC | PRN

## 2019-07-11 MED ORDER — POTASSIUM CHLORIDE CRYS ER 20 MEQ PO TBCR
20.0000 meq | EXTENDED_RELEASE_TABLET | Freq: Two times a day (BID) | ORAL | Status: DC
  Administered 2019-07-11 – 2019-07-16 (×10): 20 meq via ORAL
  Filled 2019-07-11 (×11): qty 1

## 2019-07-11 MED ORDER — ENOXAPARIN SODIUM 40 MG/0.4ML ~~LOC~~ SOLN
40.0000 mg | SUBCUTANEOUS | Status: DC
  Administered 2019-07-11 – 2019-07-15 (×5): 40 mg via SUBCUTANEOUS
  Filled 2019-07-11 (×5): qty 0.4

## 2019-07-11 MED ORDER — SERTRALINE HCL 50 MG PO TABS
100.0000 mg | ORAL_TABLET | Freq: Every day | ORAL | Status: DC
  Administered 2019-07-11 – 2019-07-15 (×5): 100 mg via ORAL
  Filled 2019-07-11 (×3): qty 2
  Filled 2019-07-11 (×2): qty 1

## 2019-07-11 NOTE — Progress Notes (Signed)
Subjective:   Procedure(s) (LRB): LEFT KNEE RADICAL IRRIGATION AND DEBRIDEMENT WITH POLY REVISION LEFT KNEE REPLACEMENT (Left) Patient reports pain as moderate.    Objective: Vital signs in last 24 hours: Temp:  [98.5 F (36.9 C)-98.6 F (37 C)] 98.6 F (37 C) (06/11 1332) Pulse Rate:  [71-81] 71 (06/11 1332) Resp:  [16-18] 16 (06/11 1332) BP: (133-148)/(72-75) 133/75 (06/11 1332) SpO2:  [97 %] 97 % (06/11 1332) Weight:  [109.8 kg] 109.8 kg (06/11 1500)  Intake/Output from previous day: No intake/output data recorded. Intake/Output this shift: Total I/O In: 3.5 [I.V.:3.5] Out: 100 [Urine:100]  Recent Labs    07/11/19 1119  HGB 12.8   Recent Labs    07/11/19 1119  WBC 12.1*  RBC 4.69  HCT 39.9  PLT 299   Recent Labs    07/11/19 1119  NA 136  K 3.5  CL 98  CO2 27  BUN 13  CREATININE 0.77  GLUCOSE 108*  CALCIUM 9.2   Recent Labs    07/11/19 1119  INR 1.1    Neurovascular intact No cellulitis present   Assessment/Plan:   Procedure(s) (LRB): LEFT KNEE RADICAL IRRIGATION AND DEBRIDEMENT WITH POLY REVISION LEFT KNEE REPLACEMENT (Left)   Lab results CRP 106.9, Sed rate 50  Spoke with pt she did have recent "squamous cell removed by dermatology just below L knee unclear if this could be source of possible infection pt just mentioned. ID consulted and started on ABX Plan is for I&D poly exchange likely Mon with [name].     Chriss Czar 07/11/2019, 4:48 PM

## 2019-07-11 NOTE — Progress Notes (Signed)
Pharmacy Antibiotic Note  Meredith Rose is a 69 y.o. female admitted on 07/11/2019 with osteomyelitis.  Pharmacy has been consulted for vancomycin dosing; ceftriaxone per MD.  Plan:  Vancomycin 2000 mg IV now, then 750 mg IV q12 hr   Measure vancomycin AUC at steady state as indicated  SCr q48 while on vanc  Rocephin per MD; dosing appropriate   Weight: 109.8 kg (242 lb)  Temp (24hrs), Avg:98.6 F (37 C), Min:98.5 F (36.9 C), Max:98.6 F (37 C)  Recent Labs  Lab 07/11/19 1119  WBC 12.1*  CREATININE 0.77    CrCl cannot be calculated (Unknown ideal weight.).    Allergies  Allergen Reactions  . Tape Itching    BANDAID OR PAPER TAPE IF LEFT ON TOO LONG      Thank you for allowing pharmacy to be a part of this patient's care.  ,  A 07/11/2019 3:17 PM
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
## 2019-07-11 NOTE — Progress Notes (Signed)
PCP - Kelton Pillar, MD Cardiologist -   PPM/ICD -  Device Orders -  Rep Notified -   Chest x-ray -  EKG - 07-11-19 Stress Test -  ECHO -  Cardiac Cath -   Sleep Study -  CPAP -   Fasting Blood Sugar -  Checks Blood Sugar _____ times a day  Blood Thinner Instructions: Aspirin Instructions:  ERAS Protcol - PRE-SURGERY Ensure or G2-   COVID TEST-    Anesthesia review:   Patient denies shortness of breath, fever, cough and chest pain at PAT appointment   All instructions explained to the patient, with a verbal understanding of the material. Patient agrees to go over the instructions while at home for a better understanding. Patient also instructed to self quarantine after being tested for COVID-19. The opportunity to ask questions was provided.

## 2019-07-11 NOTE — Patient Instructions (Signed)
DUE TO COVID-19 ONLY ONE VISITOR IS ALLOWED TO COME WITH YOU AND STAY IN THE WAITING ROOM ONLY DURING PRE OP AND PROCEDURE DAY OF SURGERY. THE 1 VISITOR MAY VISIT WITH YOU AFTER SURGERY IN YOUR PRIVATE ROOM DURING VISITING HOURS ONLY!  YOU NEED TO HAVE A COVID 19 TEST ON_______ @_______ , THIS TEST MUST BE DONE BEFORE SURGERY, COME  [place]  , [address].  ([place]) ONCE YOUR COVID TEST IS COMPLETED, PLEASE BEGIN THE QUARANTINE INSTRUCTIONS AS OUTLINED IN YOUR HANDOUT.                Meredith Rose  07/11/2019   Your procedure is scheduled on: 07-18-19   [facility] to [facility]  Entr[facility]    Report to Admitting at 5:30 AM     Call this number if you have problems the morning of surgery [phone]    Remember: Do not eat food or drink liquids :After Midnight..     Take these medicines the morning of surgery with A SIP OF WATER: Amlodipine (Norvasc), Levothyroxine (Synthroid), and Propranolol (Inderal)   BRUSH YOUR TEETH MORNING OF SURGERY AND RINSE YOUR MOUTH OUT, NO CHEWING GUM CANDY OR MINTS                               You may not have any metal on your body including hair pins and              piercings     Do not wear jewelry, make-up, lotions, powders or perfumes, deodorant              Do not wear nail polish on your fingernails.  Do not shave  48 hours prior to surgery.                 Do not bring valuables to the hospital. [facility].  Contacts, dentures or bridgework may not be worn into surgery.  You may bring an overnight bag    :  Special Instructions: N/A              Please read over the following fact sheets you were given: _____________________________________________________________________             [facility] - Preparing for Surgery Before surgery, you can play an important role.  Because skin is not sterile, your skin needs to be as free of germs  as possible.  You can reduce the number of germs on your skin by washing with CHG (chlorahexidine gluconate) soap before surgery.  CHG is an antiseptic cleaner which kills germs and bonds with the skin to continue killing germs even after washing. Please DO NOT use if you have an allergy to CHG or antibacterial soaps.  If your skin becomes reddened/irritated stop using the CHG and inform your nurse when you arrive at Short Stay. Do not shave (including legs and underarms) for at least 48 hours prior to the first CHG shower.  You may shave your face/neck. Please follow these instructions carefully:  1.  Shower with CHG Soap the night before surgery and the  morning of Surgery.  2.  If you choose to wash your hair, wash your hair first as usual with your  normal  shampoo.  3.  After you shampoo, rinse  your hair and body thoroughly to remove the  shampoo.                           4.  Use CHG as you would any other liquid soap.  You can apply chg directly  to the skin and wash                       Gently with a scrungie or clean washcloth.  5.  Apply the CHG Soap to your body ONLY FROM THE NECK DOWN.   Do not use on face/ open                           Wound or open sores. Avoid contact with eyes, ears mouth and genitals (private parts).                       Wash face,  Genitals (private parts) with your normal soap.             6.  Wash thoroughly, paying special attention to the area where your surgery  will be performed.  7.  Thoroughly rinse your body with warm water from the neck down.  8.  DO NOT shower/wash with your normal soap after using and rinsing off  the CHG Soap.                9.  Pat yourself dry with a clean towel.            10.  Wear clean pajamas.            11.  Place clean sheets on your bed the night of your first shower and do not  sleep with pets. Day of Surgery : Do not apply any lotions/deodorants the morning of surgery.  Please wear clean clothes to the hospital/surgery  center.  FAILURE TO FOLLOW THESE INSTRUCTIONS MAY RESULT IN THE CANCELLATION OF YOUR SURGERY PATIENT SIGNATURE_________________________________  NURSE SIGNATURE__________________________________  ________________________________________________________________________

## 2019-07-12 LAB — CREATININE, SERUM
Creatinine, Ser: 0.77 mg/dL (ref 0.44–1.00)
GFR calc Af Amer: >60 mL/min (ref >60)
GFR calc non Af Amer: >60 mL/min (ref >60)

## 2019-07-12 LAB — SEDIMENTATION RATE: Sed Rate: 58 mm/hr — ABNORMAL HIGH (ref 0–22)

## 2019-07-12 LAB — HEPATITIS C ANTIBODY: HCV Ab: NONREACTIVE

## 2019-07-12 LAB — URINE CULTURE: Culture: NO GROWTH

## 2019-07-12 LAB — HIV ANTIBODY (ROUTINE TESTING W REFLEX): HIV Screen 4th Generation wRfx: NONREACTIVE

## 2019-07-12 LAB — C-REACTIVE PROTEIN: CRP: 12.3 mg/dL — ABNORMAL HIGH (ref <1.0)

## 2019-07-12 MED ORDER — ACETAMINOPHEN 325 MG PO TABS
650.0000 mg | ORAL_TABLET | Freq: Four times a day (QID) | ORAL | Status: DC | PRN
  Administered 2019-07-12 – 2019-07-13 (×2): 650 mg via ORAL
  Filled 2019-07-12 (×2): qty 2

## 2019-07-12 NOTE — Consult Note (Signed)
Reason for Consult:Infected left total knee arthroplasty Referring Physician: Dr. Earlie Server  Meredith Rose is an 69 y.o. female.  HPI: 69 year old female who had primary cemented Depuy Sigma RP Left total knee arthroplasty 11/16/17. Patient noted the spontaneous onset of discomfort in her left total knee 07/08/19 and by the next morning had difficulty bearing weight on it.  She was seen by Dr. French Ana on 07/09/19 with some swelling and pain in the knee which was aspirated yielding 30 cc of dark brownish fluid that was sent off for Gram stain culture and crystals.  In addition blood work was also sent off for CBC sed rate and CRP.  At that time the patient's pain is increased she had difficulty putting weight on the knee but remained afebrile.  The CBC showed an elevated white cell count of 14,000 the sed rate was 50 and the CRP was 109.  Cell count on the synovial fluid was 64,098% polys but there were no organisms on the Gram stain.The patient was then admitted to the hospital 07/11/19 and Infectious diseases consultation was obtained and she was started on IV Rocephin and vancomycin empirically.  Overnight the increased warmth and heat in her left knee diminished she maintained a good range of motion flexing to 120 and to palpation this morning I could not detect a recurrent effusion.  A few hours ago the cultures came back positive for Streptococcus mitus and this information was relayed to Dr. Linus Salmons of infectious diseases.     Past Medical History:  Diagnosis Date  . Anxiety   . Arthritis    OA  . Congestion of throat    SINUSES, NONPRODUCTIVE COUGH  . Hypertension    takes meds daily  . Hypothyroidism    takes meds daily  . Ovarian cancer ([place]) 2002  . Ruptured appendicitis 06/03/11  . 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 ABDOMINAL HYSTERECTOMY  2002   "radical; for ovarian cancer"  . ABDOMINAL WOUND DEHISCENCE   2002-2003   "twice"  . APPENDECTOMY  06/05/2011   Procedure: APPENDECTOMY;  Surgeon: Gwenyth Ober, MD;  Location: [facility];  Service: General;;  . CARPAL TUNNEL RELEASE Right AFTER 2013  . KNEE ARTHROSCOPY  ~ 2007   left  . KNEE ARTHROSCOPY  12/2010   right  . SQUAMOUS CELL CARCINOMA EXCISION  10/2009   right shin  . TOTAL KNEE ARTHROPLASTY  09/22/2011   Procedure: TOTAL KNEE ARTHROPLASTY;  Surgeon: Yvette Rack., MD;  Location: [facility];  Service: Orthopedics;  Laterality: Right;  . TOTAL KNEE ARTHROPLASTY Left 11/16/2017   Procedure: LEFT TOTAL KNEE ARTHROPLASTY;  Surgeon: Earlie Server, MD;  Location: [facility];  Service: Orthopedics;  Laterality: Left;    No family history on file.  Social History:  reports that she has never smoked. She has never used smokeless tobacco. She reports that she does not drink alcohol and does not use drugs.  Allergies:  Allergies  Allergen Reactions  . Tape Itching    BANDAID OR PAPER TAPE IF LEFT ON TOO LONG     Medications: I have reviewed the patient's current medications.  Results for orders placed or performed during the hospital encounter of 07/11/19 (from the past 48 hour(s))  SARS Coronavirus 2 by RT PCR (hospital order, performed in [facility] hospital lab) Nasopharyngeal Nasopharyngeal Swab     Status: None   Collection Time: 07/11/19 10:46 AM  Specimen: Nasopharyngeal Swab  Result Value Ref Range   SARS Coronavirus 2 NEGATIVE NEGATIVE    Comment: (NOTE) SARS-CoV-2 target nucleic acids are NOT DETECTED.  The SARS-CoV-2 RNA is generally detectable in upper and lower respiratory specimens during the acute phase of infection. The lowest concentration of SARS-CoV-2 viral copies this assay can detect is 250 copies / mL. A negative result does not preclude SARS-CoV-2 infection and should not be used as the sole basis for treatment or other patient management decisions.  A negative result may occur with improper specimen collection /  handling, submission of specimen other than nasopharyngeal swab, presence of viral mutation(s) within the areas targeted by this assay, and inadequate number of viral copies (<250 copies / mL). A negative result must be combined with clinical observations, patient history, and epidemiological information.  Fact Sheet for Patients:   StrictlyIdeas.no  Fact Sheet for Healthcare Providers: BankingDealers.co.za  This test is not yet approved or  cleared by the [place] FDA and has been authorized for detection and/or diagnosis of SARS-CoV-2 by FDA under an Emergency Use Authorization (EUA).  This EUA will remain in effect (meaning this test can be used) for the duration of the COVID-19 declaration under Section 564(b)(1) of the Act, 21 U.S.C. section 360bbb-3(b)(1), unless the authorization is terminated or revoked sooner.  Performed at [facility], [place] [address], [place], [place] [address]   Urinalysis, Routine w reflex microscopic     Status: Abnormal   Collection Time: 07/11/19 11:15 AM  Result Value Ref Range   Color, Urine YELLOW YELLOW   APPearance CLEAR CLEAR   Specific Gravity, Urine 1.017 1.005 - 1.030   pH 6.0 5.0 - 8.0   Glucose, UA NEGATIVE NEGATIVE mg/dL   Hgb urine dipstick SMALL (A) NEGATIVE   Bilirubin Urine NEGATIVE NEGATIVE   Ketones, ur NEGATIVE NEGATIVE mg/dL   Protein, ur NEGATIVE NEGATIVE mg/dL   Nitrite NEGATIVE NEGATIVE   Leukocytes,Ua TRACE (A) NEGATIVE   RBC / HPF 0-5 0 - 5 RBC/hpf   WBC, UA 0-5 0 - 5 WBC/hpf   Bacteria, UA RARE (A) NONE SEEN   Squamous Epithelial / LPF 0-5 0 - 5   Mucus PRESENT     Comment: Performed at [facility], [place] [address] [place], [place] [address]  Urine culture     Status: None   Collection Time: 07/11/19 11:15 AM   Specimen: Urine, Clean Catch  Result Value Ref Range   Specimen Description      URINE, CLEAN CATCH Performed at  [facility], [place] [address], [place], [place] [address]    Special Requests      NONE Performed at [facility], [place] [address], [place], [place] [address]    Culture      NO GROWTH Performed at [facility], [place] [address], [place], [place] [address]    Report Status 07/12/2019 FINAL   APTT     Status: None   Collection Time: 07/11/19 11:19 AM  Result Value Ref Range   aPTT 33 24 - 36 seconds    Comment: Performed at [facility], [place] [address] [place], [place] [address]  CBC WITH DIFFERENTIAL     Status: Abnormal   Collection Time: 07/11/19 11:19 AM  Result Value Ref Range   WBC 12.1 (H) 4.0 - 10.5 K/uL   RBC 4.69 3.87 - 5.11 MIL/uL   Hemoglobin 12.8 12.0 - 15.0 g/dL  HCT 39.9 36 - 46 %   MCV 85.1 80.0 - 100.0 fL   MCH 27.3 26.0 - 34.0 pg   MCHC 32.1 30.0 - 36.0 g/dL   RDW 14.5 11.5 - 15.5 %   Platelets 299 150 - 400 K/uL   nRBC 0.0 0.0 - 0.2 %   Neutrophils Relative % 75 %   Neutro Abs 9.1 (H) 1.7 - 7.7 K/uL   Lymphocytes Relative 14 %   Lymphs Abs 1.7 0.7 - 4.0 K/uL   Monocytes Relative 8 %   Monocytes Absolute 0.9 0 - 1 K/uL   Eosinophils Relative 2 %   Eosinophils Absolute 0.3 0 - 0 K/uL   Basophils Relative 1 %   Basophils Absolute 0.1 0 - 0 K/uL   Immature Granulocytes 0 %   Abs Immature Granulocytes 0.05 0.00 - 0.07 K/uL    Comment: Performed at [facility], [place] [address] [place], [place] [address]  Comprehensive metabolic panel     Status: Abnormal   Collection Time: 07/11/19 11:19 AM  Result Value Ref Range   Sodium 136 135 - 145 mmol/L   Potassium 3.5 3.5 - 5.1 mmol/L   Chloride 98 98 - 111 mmol/L   CO2 27 22 - 32 mmol/L   Glucose, Bld 108 (H) 70 - 99 mg/dL    Comment: Glucose reference range applies only to samples taken after fasting for at least 8 hours.   BUN 13 8 - 23 mg/dL   Creatinine, Ser 0.77 0.44 - 1.00 mg/dL   Calcium 9.2 8.9 - 10.3 mg/dL   Total  Protein 7.6 6.5 - 8.1 g/dL   Albumin 4.0 3.5 - 5.0 g/dL   AST 22 15 - 41 U/L   ALT 19 0 - 44 U/L   Alkaline Phosphatase 80 38 - 126 U/L   Total Bilirubin 1.1 0.3 - 1.2 mg/dL   GFR calc non Af Amer >60 >60 mL/min   GFR calc Af Amer >60 >60 mL/min   Anion gap 11 5 - 15    Comment: Performed at [facility], [place] [address] [place], [place] [address]  Protime-INR     Status: None   Collection Time: 07/11/19 11:19 AM  Result Value Ref Range   Prothrombin Time 13.7 11.4 - 15.2 seconds   INR 1.1 0.8 - 1.2    Comment: (NOTE) INR goal varies based on device and disease states. Performed at [facility], [place] [address] [place], [place] [address]   Type and screen Order type and screen if day of surgery is less than 15 days from draw of preadmission visit or order morning of surgery if day of surgery is greater than 6 days from preadmission visit.     Status: None   Collection Time: 07/11/19 11:19 AM  Result Value Ref Range   ABO/RH(D) A POS    Antibody Screen NEG    Sample Expiration      07/14/2019,2359 Performed at [facility], [place] [address] [place] [address] [place], [place] [address]   Culture, blood (routine x 2)     Status: None (Preliminary result)   Collection Time: 07/11/19 11:19 AM   Specimen: BLOOD LEFT ARM  Result Value Ref Range   Specimen Description      BLOOD LEFT ARM Performed at [place] [address], [place],  [address]    Special Requests      BOTTLES DRAWN AEROBIC AND ANAEROBIC Blood Culture adequate  volume Performed at [facility], [place] [address] [place], [place] [address]    Culture  Setup Time PENDING    Culture      NO GROWTH < 24 HOURS Performed at [facility], [place] [address] [place], [place] [address]    Report Status PENDING   MRSA PCR Screening     Status: None   Collection Time: 07/11/19 11:22 AM   Specimen: Nasal Mucosa; Nasopharyngeal    Result Value Ref Range   MRSA by PCR NEGATIVE NEGATIVE    Comment:        The GeneXpert MRSA Assay (FDA approved for NASAL specimens only), is one component of a comprehensive MRSA colonization surveillance program. It is not intended to diagnose MRSA infection nor to guide or monitor treatment for MRSA infections. Performed at [facility], [place] [address], [place], [place] [address]   Culture, blood (routine x 2)     Status: None (Preliminary result)   Collection Time: 07/11/19 11:23 AM   Specimen: BLOOD LEFT ARM  Result Value Ref Range   Specimen Description      BLOOD LEFT ARM Performed at [place] [address] [place], [place] [address]    Special Requests      BOTTLES DRAWN AEROBIC AND ANAEROBIC Blood Culture adequate volume Performed at [place] [address] [place], [place] [address]    Culture      NO GROWTH < 24 HOURS Performed at [place] [address], [place], [place] [address]    Report Status PENDING   Sedimentation rate     Status: Abnormal   Collection Time: 07/12/19  2:30 AM  Result Value Ref Range   Sed Rate 58 (H) 0 - 22 mm/hr    Comment: Performed at [facility], [place] [address] [place], [place] [address]  C-reactive protein     Status: Abnormal   Collection Time: 07/12/19  2:30 AM  Result Value Ref Range   CRP 12.3 (H) <1.0 mg/dL    Comment: Performed at [facility], [place] [address] [place], [place] [address]  HIV Antibody (routine testing w rflx)     Status: None   Collection Time: 07/12/19  2:30 AM  Result Value Ref Range   HIV Screen 4th Generation wRfx Non Reactive Non Reactive    Comment: Performed at [facility], [place] [address], [place], [place] [address]  Hepatitis C antibody     Status: None   Collection Time: 07/12/19  2:30 AM  Result Value Ref Range   HCV Ab NON REACTIVE NON REACTIVE    Comment:  (NOTE) Nonreactive HCV antibody screen is consistent with no HCV infections,  unless recent infection is suspected or other evidence exists to indicate HCV infection.  Performed at [facility], [place] [address], [place],  [address]   Creatinine, serum     Status: None   Collection Time: 07/12/19  2:30 AM  Result Value Ref Range   Creatinine, Ser 0.77 0.44 - 1.00 mg/dL   GFR calc non Af Amer >60 >60 mL/min   GFR calc Af Amer >60 >60 mL/min    Comment: Performed at [facility], [place] [address] [place],  [address]    DG Chest 2 View  Result Date: 07/11/2019 CLINICAL DATA:  Septic arthritis of left knee. EXAM: CHEST - 2 VIEW COMPARISON:  11/08/2017 FINDINGS: The cardiomediastinal  contours are normal. Chronic bronchial and interstitial thickening, unchanged from prior exam. Subsegmental atelectasis at the left lung base. Mild biapical pleuroparenchymal scarring. Pulmonary vasculature is normal. No consolidation, pleural effusion, or pneumothorax. No acute osseous abnormalities are seen. IMPRESSION: 1. Chronic bronchial and interstitial thickening. 2. Subsegmental atelectasis at the left lung base. Electronically Signed   By: Keith Rake M.D.   On: 07/11/2019 16:58    Review of Systems Blood pressure 111/62, pulse 71, temperature 98.4 F (36.9 C), resp. rate 18, weight 109.8 kg, SpO2 93 %. Physical Exam: On physical examination the morning of 07/12/19 Left total kneerange of motion was 5/120 collateral ligaments are stable markedly decreased warmth compared to the previous day.Good quadriceps and hamstring power skin is intact toes are pink and well perfused neurovascular intact distally.  The contralateral right total knee also has a well-healed incision no effusion no discomfort to palpation.  Assessment/Plan: Acute hematogenous infection of left total knee with Streptococcus mitus symptoms first noticed 3 days prior to admission yesterday.  Plan:  Options were discussed at length with the patient, Streptococcus is sensitive to Rocephin and does not form any biofilm.  We will proceed with radical irrigation and debridement and polyethylene bearing exchange.  Surgery is planned for Monday at 1230.  Patient understands the risks and benefits of the procedure expected success rate with a 1 stage revision and IV antibiotics followed by oral antibiotics is somewhere around 90%.  If this fails she will be a candidate for a 2 stage revision with a temporary antibiotic-loaded cemented spacer.  [name] 07/12/2019, 7:35 PM

## 2019-07-12 NOTE — Plan of Care (Signed)
  Problem: Coping: Goal: Level of anxiety will decrease 07/12/2019 2334 by Blase Mess, RN Outcome: Progressing 07/12/2019 2333 by Blase Mess, RN Outcome: Progressing   Problem: Elimination: Goal: Will not experience complications related to bowel motility 07/12/2019 2334 by Blase Mess, RN Outcome: Progressing 07/12/2019 2333 by Blase Mess, RN Outcome: Progressing Goal: Will not experience complications related to urinary retention 07/12/2019 2334 by Blase Mess, RN Outcome: Progressing 07/12/2019 2333 by Blase Mess, RN Outcome: Progressing   Problem: Pain Managment: Goal: General experience of comfort will improve 07/12/2019 2334 by Blase Mess, RN Outcome: Progressing 07/12/2019 2333 by Blase Mess, RN Outcome: Progressing   Problem: Safety: Goal: Ability to remain free from injury will improve 07/12/2019 2334 by Blase Mess, RN Outcome: Progressing 07/12/2019 2333 by Blase Mess, RN Outcome: Progressing   Problem: Skin Integrity: Goal: Risk for impaired skin integrity will decrease 07/12/2019 2334 by Blase Mess, RN Outcome: Progressing 07/12/2019 2333 by Blase Mess, RN Outcome: Progressing

## 2019-07-12 NOTE — H&P (View-Only) (Signed)
Reason for Consult:Infected left total knee arthroplasty Referring Physician: Dr. Earlie Server  Meredith Rose is an 69 y.o. female.  HPI: 69 year old female who had primary cemented Depuy Sigma RP Left total knee arthroplasty 11/16/17. Patient noted the spontaneous onset of discomfort in her left total knee 07/08/19 and by the next morning had difficulty bearing weight on it.  She was seen by Dr. French Ana on 07/09/19 with some swelling and pain in the knee which was aspirated yielding 30 cc of dark brownish fluid that was sent off for Gram stain culture and crystals.  In addition blood work was also sent off for CBC sed rate and CRP.  At that time the patient's pain is increased she had difficulty putting weight on the knee but remained afebrile.  The CBC showed an elevated white cell count of 14,000 the sed rate was 50 and the CRP was 109.  Cell count on the synovial fluid was 64,098% polys but there were no organisms on the Gram stain.The patient was then admitted to the hospital 07/11/19 and Infectious diseases consultation was obtained and she was started on IV Rocephin and vancomycin empirically.  Overnight the increased warmth and heat in her left knee diminished she maintained a good range of motion flexing to 120 and to palpation this morning I could not detect a recurrent effusion.  A few hours ago the cultures came back positive for Streptococcus mitus and this information was relayed to Dr. Linus Salmons of infectious diseases.     Past Medical History:  Diagnosis Date  . Anxiety   . Arthritis    OA  . Congestion of throat    SINUSES, NONPRODUCTIVE COUGH  . Hypertension    takes meds daily  . Hypothyroidism    takes meds daily  . Ovarian cancer ([place]) 2002  . Ruptured appendicitis 06/03/11  . 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 ABDOMINAL HYSTERECTOMY  2002   "radical; for ovarian cancer"  . ABDOMINAL WOUND DEHISCENCE   2002-2003   "twice"  . APPENDECTOMY  06/05/2011   Procedure: APPENDECTOMY;  Surgeon: Gwenyth Ober, MD;  Location: [facility];  Service: General;;  . CARPAL TUNNEL RELEASE Right AFTER 2013  . KNEE ARTHROSCOPY  ~ 2007   left  . KNEE ARTHROSCOPY  12/2010   right  . SQUAMOUS CELL CARCINOMA EXCISION  10/2009   right shin  . TOTAL KNEE ARTHROPLASTY  09/22/2011   Procedure: TOTAL KNEE ARTHROPLASTY;  Surgeon: Yvette Rack., MD;  Location: [facility];  Service: Orthopedics;  Laterality: Right;  . TOTAL KNEE ARTHROPLASTY Left 11/16/2017   Procedure: LEFT TOTAL KNEE ARTHROPLASTY;  Surgeon: Earlie Server, MD;  Location: [facility];  Service: Orthopedics;  Laterality: Left;    No family history on file.  Social History:  reports that she has never smoked. She has never used smokeless tobacco. She reports that she does not drink alcohol and does not use drugs.  Allergies:  Allergies  Allergen Reactions  . Tape Itching    BANDAID OR PAPER TAPE IF LEFT ON TOO LONG     Medications: I have reviewed the patient's current medications.  Results for orders placed or performed during the hospital encounter of 07/11/19 (from the past 48 hour(s))  SARS Coronavirus 2 by RT PCR (hospital order, performed in [facility] hospital lab) Nasopharyngeal Nasopharyngeal Swab     Status: None   Collection Time: 07/11/19 10:46 AM  Specimen: Nasopharyngeal Swab  Result Value Ref Range   SARS Coronavirus 2 NEGATIVE NEGATIVE    Comment: (NOTE) SARS-CoV-2 target nucleic acids are NOT DETECTED.  The SARS-CoV-2 RNA is generally detectable in upper and lower respiratory specimens during the acute phase of infection. The lowest concentration of SARS-CoV-2 viral copies this assay can detect is 250 copies / mL. A negative result does not preclude SARS-CoV-2 infection and should not be used as the sole basis for treatment or other patient management decisions.  A negative result may occur with improper specimen collection /  handling, submission of specimen other than nasopharyngeal swab, presence of viral mutation(s) within the areas targeted by this assay, and inadequate number of viral copies (<250 copies / mL). A negative result must be combined with clinical observations, patient history, and epidemiological information.  Fact Sheet for Patients:   StrictlyIdeas.no  Fact Sheet for Healthcare Providers: BankingDealers.co.za  This test is not yet approved or  cleared by the [place] FDA and has been authorized for detection and/or diagnosis of SARS-CoV-2 by FDA under an Emergency Use Authorization (EUA).  This EUA will remain in effect (meaning this test can be used) for the duration of the COVID-19 declaration under Section 564(b)(1) of the Act, 21 U.S.C. section 360bbb-3(b)(1), unless the authorization is terminated or revoked sooner.  Performed at [facility], [place] [address] [place], [place] [address]   Urinalysis, Routine w reflex microscopic     Status: Abnormal   Collection Time: 07/11/19 11:15 AM  Result Value Ref Range   Color, Urine YELLOW YELLOW   APPearance CLEAR CLEAR   Specific Gravity, Urine 1.017 1.005 - 1.030   pH 6.0 5.0 - 8.0   Glucose, UA NEGATIVE NEGATIVE mg/dL   Hgb urine dipstick SMALL (A) NEGATIVE   Bilirubin Urine NEGATIVE NEGATIVE   Ketones, ur NEGATIVE NEGATIVE mg/dL   Protein, ur NEGATIVE NEGATIVE mg/dL   Nitrite NEGATIVE NEGATIVE   Leukocytes,Ua TRACE (A) NEGATIVE   RBC / HPF 0-5 0 - 5 RBC/hpf   WBC, UA 0-5 0 - 5 WBC/hpf   Bacteria, UA RARE (A) NONE SEEN   Squamous Epithelial / LPF 0-5 0 - 5   Mucus PRESENT     Comment: Performed at [facility], [place] [address], [place], [place] [address]  Urine culture     Status: None   Collection Time: 07/11/19 11:15 AM   Specimen: Urine, Clean Catch  Result Value Ref Range   Specimen Description      URINE, CLEAN CATCH Performed at  [facility], [place] [address] [place], [place] [address]    Special Requests      NONE Performed at [facility], [place] [address], [place], [place] [address]    Culture      NO GROWTH Performed at [facility], [place] [address], [place], [place] [address]    Report Status 07/12/2019 FINAL   APTT     Status: None   Collection Time: 07/11/19 11:19 AM  Result Value Ref Range   aPTT 33 24 - 36 seconds    Comment: Performed at [facility], [place] [address] [place], [place] [address]  CBC WITH DIFFERENTIAL     Status: Abnormal   Collection Time: 07/11/19 11:19 AM  Result Value Ref Range   WBC 12.1 (H) 4.0 - 10.5 K/uL   RBC 4.69 3.87 - 5.11 MIL/uL   Hemoglobin 12.8 12.0 - 15.0 g/dL  HCT 39.9 36 - 46 %   MCV 85.1 80.0 - 100.0 fL   MCH 27.3 26.0 - 34.0 pg   MCHC 32.1 30.0 - 36.0 g/dL   RDW 14.5 11.5 - 15.5 %   Platelets 299 150 - 400 K/uL   nRBC 0.0 0.0 - 0.2 %   Neutrophils Relative % 75 %   Neutro Abs 9.1 (H) 1.7 - 7.7 K/uL   Lymphocytes Relative 14 %   Lymphs Abs 1.7 0.7 - 4.0 K/uL   Monocytes Relative 8 %   Monocytes Absolute 0.9 0 - 1 K/uL   Eosinophils Relative 2 %   Eosinophils Absolute 0.3 0 - 0 K/uL   Basophils Relative 1 %   Basophils Absolute 0.1 0 - 0 K/uL   Immature Granulocytes 0 %   Abs Immature Granulocytes 0.05 0.00 - 0.07 K/uL    Comment: Performed at [facility], [place] [address], [place], [place] [address]  Comprehensive metabolic panel     Status: Abnormal   Collection Time: 07/11/19 11:19 AM  Result Value Ref Range   Sodium 136 135 - 145 mmol/L   Potassium 3.5 3.5 - 5.1 mmol/L   Chloride 98 98 - 111 mmol/L   CO2 27 22 - 32 mmol/L   Glucose, Bld 108 (H) 70 - 99 mg/dL    Comment: Glucose reference range applies only to samples taken after fasting for at least 8 hours.   BUN 13 8 - 23 mg/dL   Creatinine, Ser 0.77 0.44 - 1.00 mg/dL   Calcium 9.2 8.9 - 10.3 mg/dL   Total  Protein 7.6 6.5 - 8.1 g/dL   Albumin 4.0 3.5 - 5.0 g/dL   AST 22 15 - 41 U/L   ALT 19 0 - 44 U/L   Alkaline Phosphatase 80 38 - 126 U/L   Total Bilirubin 1.1 0.3 - 1.2 mg/dL   GFR calc non Af Amer >60 >60 mL/min   GFR calc Af Amer >60 >60 mL/min   Anion gap 11 5 - 15    Comment: Performed at [facility], [place] [address], [place], [place] [address]  Protime-INR     Status: None   Collection Time: 07/11/19 11:19 AM  Result Value Ref Range   Prothrombin Time 13.7 11.4 - 15.2 seconds   INR 1.1 0.8 - 1.2    Comment: (NOTE) INR goal varies based on device and disease states. Performed at [facility], [place] [address] [place], [place] [address]   Type and screen Order type and screen if day of surgery is less than 15 days from draw of preadmission visit or order morning of surgery if day of surgery is greater than 6 days from preadmission visit.     Status: None   Collection Time: 07/11/19 11:19 AM  Result Value Ref Range   ABO/RH(D) A POS    Antibody Screen NEG    Sample Expiration      07/14/2019,2359 Performed at [facility], [place] [address] [place], [place] [address]   Culture, blood (routine x 2)     Status: None (Preliminary result)   Collection Time: 07/11/19 11:19 AM   Specimen: BLOOD LEFT ARM  Result Value Ref Range   Specimen Description      BLOOD LEFT ARM Performed at [place] [address] [place], [place] [address]    Special Requests      BOTTLES DRAWN AEROBIC AND ANAEROBIC Blood Culture adequate  volume Performed at [facility], [place] [address] [place], [place] [address]    Culture  Setup Time PENDING    Culture      NO GROWTH < 24 HOURS Performed at [facility], [place] [address], [place], [place] [address]    Report Status PENDING   MRSA PCR Screening     Status: None   Collection Time: 07/11/19 11:22 AM   Specimen: Nasal Mucosa; Nasopharyngeal    Result Value Ref Range   MRSA by PCR NEGATIVE NEGATIVE    Comment:        The GeneXpert MRSA Assay (FDA approved for NASAL specimens only), is one component of a comprehensive MRSA colonization surveillance program. It is not intended to diagnose MRSA infection nor to guide or monitor treatment for MRSA infections. Performed at [facility], [place] [address], [place], [place] [address]   Culture, blood (routine x 2)     Status: None (Preliminary result)   Collection Time: 07/11/19 11:23 AM   Specimen: BLOOD LEFT ARM  Result Value Ref Range   Specimen Description      BLOOD LEFT ARM Performed at [place] [address], [place], [place] [address]    Special Requests      BOTTLES DRAWN AEROBIC AND ANAEROBIC Blood Culture adequate volume Performed at [place] [address], [place], [place] [address]    Culture      NO GROWTH < 24 HOURS Performed at [place] [address] [place], [place] [address]    Report Status PENDING   Sedimentation rate     Status: Abnormal   Collection Time: 07/12/19  2:30 AM  Result Value Ref Range   Sed Rate 58 (H) 0 - 22 mm/hr    Comment: Performed at [facility], [place] [address] [place], [place] [address]  C-reactive protein     Status: Abnormal   Collection Time: 07/12/19  2:30 AM  Result Value Ref Range   CRP 12.3 (H) <1.0 mg/dL    Comment: Performed at [facility], [place] [address] [place], [place] [address]  HIV Antibody (routine testing w rflx)     Status: None   Collection Time: 07/12/19  2:30 AM  Result Value Ref Range   HIV Screen 4th Generation wRfx Non Reactive Non Reactive    Comment: Performed at [facility], [place] [address], [place], [place] [address]  Hepatitis C antibody     Status: None   Collection Time: 07/12/19  2:30 AM  Result Value Ref Range   HCV Ab NON REACTIVE NON REACTIVE    Comment:  (NOTE) Nonreactive HCV antibody screen is consistent with no HCV infections,  unless recent infection is suspected or other evidence exists to indicate HCV infection.  Performed at [facility], [place] [address] [place],  [address]   Creatinine, serum     Status: None   Collection Time: 07/12/19  2:30 AM  Result Value Ref Range   Creatinine, Ser 0.77 0.44 - 1.00 mg/dL   GFR calc non Af Amer >60 >60 mL/min   GFR calc Af Amer >60 >60 mL/min    Comment: Performed at [facility], [place] [address] [place],  [address]    DG Chest 2 View  Result Date: 07/11/2019 CLINICAL DATA:  Septic arthritis of left knee. EXAM: CHEST - 2 VIEW COMPARISON:  11/08/2017 FINDINGS: The cardiomediastinal  contours are normal. Chronic bronchial and interstitial thickening, unchanged from prior exam. Subsegmental atelectasis at the left lung base. Mild biapical pleuroparenchymal scarring. Pulmonary vasculature is normal. No consolidation, pleural effusion, or pneumothorax. No acute osseous abnormalities are seen. IMPRESSION: 1. Chronic bronchial and interstitial thickening. 2. Subsegmental atelectasis at the left lung base. Electronically Signed   By: Keith Rake M.D.   On: 07/11/2019 16:58    Review of Systems Blood pressure 111/62, pulse 71, temperature 98.4 F (36.9 C), resp. rate 18, weight 109.8 kg, SpO2 93 %. Physical Exam: On physical examination the morning of 07/12/19 Left total kneerange of motion was 5/120 collateral ligaments are stable markedly decreased warmth compared to the previous day.Good quadriceps and hamstring power skin is intact toes are pink and well perfused neurovascular intact distally.  The contralateral right total knee also has a well-healed incision no effusion no discomfort to palpation.  Assessment/Plan: Acute hematogenous infection of left total knee with Streptococcus mitus symptoms first noticed 3 days prior to admission yesterday.  Plan:  Options were discussed at length with the patient, Streptococcus is sensitive to Rocephin and does not form any biofilm.  We will proceed with radical irrigation and debridement and polyethylene bearing exchange.  Surgery is planned for Monday at 1230.  Patient understands the risks and benefits of the procedure expected success rate with a 1 stage revision and IV antibiotics followed by oral antibiotics is somewhere around 90%.  If this fails she will be a candidate for a 2 stage revision with a temporary antibiotic-loaded cemented spacer.  [name] 07/12/2019, 7:35 PM

## 2019-07-12 NOTE — Progress Notes (Signed)
Spoke with Anderson Malta RN re vancomycin via PIV.  Per ID note, to be getting a PICC for LT ABT once cultures negative.

## 2019-07-12 NOTE — Plan of Care (Signed)
  Problem: Clinical Measurements: Goal: Respiratory complications will improve Outcome: Progressing   Problem: Clinical Measurements: Goal: Cardiovascular complication will be avoided Outcome: Progressing   Problem: Activity: Goal: Risk for activity intolerance will decrease Outcome: Progressing   Problem: Coping: Goal: Level of anxiety will decrease Outcome: Progressing   Problem: Pain Managment: Goal: General experience of comfort will improve Outcome: Progressing   

## 2019-07-12 NOTE — Progress Notes (Signed)
Orthopaedic Trauma Progress Note  S: Sleeping comfortably this morning. Pain manageable currently.   O:  Vitals:   07/11/19 2225 07/12/19 0556  BP: 133/61 (!) 125/101  Pulse: 72 70  Resp: 18 14  Temp: 98.3 F (36.8 C) 98.4 F (36.9 C)  SpO2: 91% 93%    General: Sleeping comfortably in bed, NAD Respiratory: No increased work of breathing. Left Lower Extremity: No cellulitis noted. Neurovascularly intact  Labs:  Results for orders placed or performed during the hospital encounter of 07/11/19 (from the past 24 hour(s))  Sedimentation rate     Status: Abnormal   Collection Time: 07/12/19  2:30 AM  Result Value Ref Range   Sed Rate 58 (H) 0 - 22 mm/hr  C-reactive protein     Status: Abnormal   Collection Time: 07/12/19  2:30 AM  Result Value Ref Range   CRP 12.3 (H) <1.0 mg/dL  HIV Antibody (routine testing w rflx)     Status: None   Collection Time: 07/12/19  2:30 AM  Result Value Ref Range   HIV Screen 4th Generation wRfx Non Reactive Non Reactive  Hepatitis C antibody     Status: None   Collection Time: 07/12/19  2:30 AM  Result Value Ref Range   HCV Ab NON REACTIVE NON REACTIVE  Creatinine, serum     Status: None   Collection Time: 07/12/19  2:30 AM  Result Value Ref Range   Creatinine, Ser 0.77 0.44 - 1.00 mg/dL   GFR calc non Af Amer >60 >60 mL/min   GFR calc Af Amer >60 >60 mL/min    Assessment: 69 year old female with left septic knee s/p TKA October 2019  Weightbearing: WBAT LLE Showering: Would hold off on showering until after surgery. Okay for bed bath   CV/Blood loss: Hgb stable. Hemodynamically stable  Pain management: Continue current regimen  VTE prophylaxis: Lovenox, SCDs  ID: Vancomycin, Ceftriaxone  Medical co-morbidities: HTN, Hypothyroidism  Dispo: Infectious disease team following. Continue to follow cultures, will need PICC line. Plan for return to OR on Monday with [name] for I&D with poly exchange     A. Carmie Kanner [facility] [phone] (office) [email or web address]

## 2019-07-12 NOTE — Plan of Care (Signed)

## 2019-07-13 ENCOUNTER — Encounter (HOSPITAL_COMMUNITY): Payer: Self-pay | Admitting: Orthopedic Surgery

## 2019-07-13 MED ORDER — TRANEXAMIC ACID 1000 MG/10ML IV SOLN
2000.0000 mg | INTRAVENOUS | Status: DC
  Filled 2019-07-13: qty 20

## 2019-07-13 MED ORDER — BUPIVACAINE LIPOSOME 1.3 % IJ SUSP
20.0000 mL | INTRAMUSCULAR | Status: DC
  Filled 2019-07-13: qty 20

## 2019-07-13 NOTE — Plan of Care (Signed)
  Problem: Clinical Measurements: Goal: Respiratory complications will improve Outcome: Progressing   Problem: Clinical Measurements: Goal: Cardiovascular complication will be avoided Outcome: Progressing   Problem: Coping: Goal: Level of anxiety will decrease Outcome: Progressing   Problem: Elimination: Goal: Will not experience complications related to bowel motility Outcome: Progressing   Problem: Pain Managment: Goal: General experience of comfort will improve Outcome: Progressing

## 2019-07-13 NOTE — Progress Notes (Signed)
Orthopaedic Trauma Progress Note  S: Doing fairly well, pain controlled. Wants to shower. Nervous about surgery tomorrow. Cultures negative thus far  O:  Vitals:   07/12/19 2122 07/13/19 0626  BP: (!) 125/91 (!) 109/58  Pulse: 78 62  Resp: 18 19  Temp: 98.5 F (36.9 C) 98.6 F (37 C)  SpO2: 99% 95%    General: Resting comfortably in bed, NAD Respiratory: No increased work of breathing. Left Lower Extremity: No cellulitis noted. No significant tenderness with palpation about the knee. Did not attempt ROM. Neurovascularly intact  Labs:  No results found for this or any previous visit (from the past 24 hour(s)).  Assessment: 69 year old female with left septic knee s/p TKA October 2019  Weightbearing: WBAT LLE Showering: Okay to shower with assistance/supervision. Must use shower chair  CV/Blood loss: Hgb stable. Hemodynamically stable  Pain management: Continue current regimen  VTE prophylaxis: Lovenox, SCDs  ID: Vancomycin, Ceftriaxone  Medical co-morbidities: HTN, Hypothyroidism  Dispo: Infectious disease team following. Will need PICC line. Plan for return to OR on Monday with [name] for I&D with poly exchange     A. Carmie Kanner [facility] [phone] (office) [email or web address]

## 2019-07-13 NOTE — Progress Notes (Signed)
    [place] for Infectious Disease   Reason for visit: Follow up on PJI  Interval History: culture from the clinic positive for Strep mitis.   Plan for a 1 stage surgery tomorrow by [name]  Physical Exam: Constitutional:  Vitals:   07/12/19 2122 07/13/19 0626  BP: (!) 125/91 (!) 109/58  Pulse: 78 62  Resp: 18 19  Temp: 98.5 F (36.9 C) 98.6 F (37 C)  SpO2: 99% 95%   patient appears in NAD  Impression: PJI, Strep mitis.  Plan: 1.  Continue ceftriaxone 2.  Stop vancomycin  Dr. Tommy Medal to follow up tomorrow

## 2019-07-14 ENCOUNTER — Inpatient Hospital Stay (HOSPITAL_COMMUNITY): Payer: Medicare Other | Admitting: Certified Registered"

## 2019-07-14 ENCOUNTER — Encounter (HOSPITAL_COMMUNITY)
Admission: AD | Disposition: A | Discharge status: Home / Self Care | Payer: Self-pay | Source: Home / Self Care | Attending: Orthopedic Surgery

## 2019-07-14 ENCOUNTER — Encounter (HOSPITAL_COMMUNITY): Payer: Self-pay | Admitting: Orthopedic Surgery

## 2019-07-14 ENCOUNTER — Encounter (HOSPITAL_COMMUNITY)
Admission: RE | Admit: 2019-07-14 | Discharge: 2019-07-14 | Disposition: A | Discharge status: Home / Self Care | Payer: Medicare Other | Source: Ambulatory Visit | Attending: Orthopedic Surgery | Admitting: Orthopedic Surgery

## 2019-07-14 DIAGNOSIS — T8453XA Infection and inflammatory reaction due to internal right knee prosthesis, initial encounter: Secondary | ICD-10-CM

## 2019-07-14 HISTORY — PX: I & D KNEE WITH POLY EXCHANGE: SHX5024

## 2019-07-14 LAB — URINALYSIS, ROUTINE W REFLEX MICROSCOPIC
Bacteria, UA: NONE SEEN
Bilirubin Urine: NEGATIVE
Glucose, UA: NEGATIVE mg/dL
Ketones, ur: NEGATIVE mg/dL
Leukocytes,Ua: NEGATIVE
Nitrite: NEGATIVE
Protein, ur: NEGATIVE mg/dL
Specific Gravity, Urine: 1.011 (ref 1.005–1.030)
pH: 6 (ref 5.0–8.0)

## 2019-07-14 SURGERY — IRRIGATION AND DEBRIDEMENT KNEE WITH POLY EXCHANGE
Anesthesia: Spinal | Site: Knee | Laterality: Left

## 2019-07-14 MED ORDER — BUPIVACAINE HCL (PF) 0.25 % IJ SOLN
INTRAMUSCULAR | Status: DC | PRN
  Administered 2019-07-14: 30 mL

## 2019-07-14 MED ORDER — ONDANSETRON HCL 4 MG/2ML IJ SOLN: 4.0000 mg | Freq: Once | INTRAMUSCULAR | Status: DC | PRN

## 2019-07-14 MED ORDER — SODIUM CHLORIDE (PF) 0.9 % IJ SOLN
INTRAMUSCULAR | Status: AC
  Filled 2019-07-14: qty 50

## 2019-07-14 MED ORDER — 0.9 % SODIUM CHLORIDE (POUR BTL) OPTIME
TOPICAL | Status: DC | PRN
  Administered 2019-07-14: 1000 mL

## 2019-07-14 MED ORDER — GABAPENTIN 100 MG PO CAPS
100.0000 mg | ORAL_CAPSULE | Freq: Three times a day (TID) | ORAL | Status: DC
  Administered 2019-07-14 – 2019-07-16 (×6): 100 mg via ORAL
  Filled 2019-07-14 (×6): qty 1

## 2019-07-14 MED ORDER — CHLORHEXIDINE GLUCONATE 4 % EX LIQD
60.0000 mL | Freq: Once | CUTANEOUS | Status: AC
  Administered 2019-07-14: 4 via TOPICAL
  Filled 2019-07-14: qty 60

## 2019-07-14 MED ORDER — ONDANSETRON HCL 4 MG/2ML IJ SOLN
INTRAMUSCULAR | Status: DC | PRN
  Administered 2019-07-14: 4 mg via INTRAVENOUS

## 2019-07-14 MED ORDER — ONDANSETRON HCL 4 MG PO TABS: 4.0000 mg | ORAL_TABLET | Freq: Four times a day (QID) | ORAL | Status: DC | PRN

## 2019-07-14 MED ORDER — SODIUM CHLORIDE (PF) 0.9 % IJ SOLN
INTRAMUSCULAR | Status: DC | PRN
  Administered 2019-07-14: 50 mL

## 2019-07-14 MED ORDER — SODIUM CHLORIDE 0.9 % IR SOLN
Status: DC | PRN
  Administered 2019-07-14: 3000 mL

## 2019-07-14 MED ORDER — STERILE WATER FOR IRRIGATION IR SOLN
Status: DC | PRN
  Administered 2019-07-14: 1000 mL

## 2019-07-14 MED ORDER — LACTATED RINGERS IV SOLN: INTRAVENOUS | Status: DC

## 2019-07-14 MED ORDER — BISACODYL 5 MG PO TBEC: 5.0000 mg | DELAYED_RELEASE_TABLET | Freq: Every day | ORAL | Status: DC | PRN

## 2019-07-14 MED ORDER — PROPOFOL 500 MG/50ML IV EMUL
INTRAVENOUS | Status: DC | PRN
  Administered 2019-07-14: 50 ug/kg/min via INTRAVENOUS
  Administered 2019-07-14: 75 ug/kg/min via INTRAVENOUS

## 2019-07-14 MED ORDER — DEXAMETHASONE SODIUM PHOSPHATE 10 MG/ML IJ SOLN
INTRAMUSCULAR | Status: DC | PRN
  Administered 2019-07-14: 10 mg via INTRAVENOUS

## 2019-07-14 MED ORDER — ASPIRIN 81 MG PO CHEW
81.0000 mg | CHEWABLE_TABLET | Freq: Two times a day (BID) | ORAL | Status: DC
  Administered 2019-07-14 – 2019-07-16 (×4): 81 mg via ORAL
  Filled 2019-07-14 (×4): qty 1

## 2019-07-14 MED ORDER — MENTHOL 3 MG MT LOZG: 1.0000 | LOZENGE | OROMUCOSAL | Status: DC | PRN

## 2019-07-14 MED ORDER — POVIDONE-IODINE 10 % EX SWAB: 2.0000 "application " | Freq: Once | CUTANEOUS | Status: DC

## 2019-07-14 MED ORDER — METOCLOPRAMIDE HCL 5 MG PO TABS: 5.0000 mg | ORAL_TABLET | Freq: Three times a day (TID) | ORAL | Status: DC | PRN

## 2019-07-14 MED ORDER — TRANEXAMIC ACID-NACL 1000-0.7 MG/100ML-% IV SOLN: 1000.0000 mg | INTRAVENOUS | Status: DC

## 2019-07-14 MED ORDER — HYDROGEN PEROXIDE 3 % EX SOLN
CUTANEOUS | Status: AC
  Filled 2019-07-14: qty 473

## 2019-07-14 MED ORDER — PHENOL 1.4 % MT LIQD
1.0000 | OROMUCOSAL | Status: DC | PRN
  Filled 2019-07-14: qty 177

## 2019-07-14 MED ORDER — DEXAMETHASONE SODIUM PHOSPHATE 10 MG/ML IJ SOLN
10.0000 mg | Freq: Once | INTRAMUSCULAR | Status: AC
  Administered 2019-07-15: 10 mg via INTRAVENOUS
  Filled 2019-07-14: qty 1

## 2019-07-14 MED ORDER — BUPIVACAINE LIPOSOME 1.3 % IJ SUSP
INTRAMUSCULAR | Status: DC | PRN
  Administered 2019-07-14: 20 mL

## 2019-07-14 MED ORDER — METOCLOPRAMIDE HCL 5 MG/ML IJ SOLN: 5.0000 mg | Freq: Three times a day (TID) | INTRAMUSCULAR | Status: DC | PRN

## 2019-07-14 MED ORDER — BUPIVACAINE HCL (PF) 0.25 % IJ SOLN
INTRAMUSCULAR | Status: AC
  Filled 2019-07-14: qty 30

## 2019-07-14 MED ORDER — MEPERIDINE HCL 50 MG/ML IJ SOLN: 6.2500 mg | INTRAMUSCULAR | Status: DC | PRN

## 2019-07-14 MED ORDER — DIPHENHYDRAMINE HCL 12.5 MG/5ML PO ELIX: 12.5000 mg | ORAL_SOLUTION | ORAL | Status: DC | PRN

## 2019-07-14 MED ORDER — DOCUSATE SODIUM 100 MG PO CAPS
100.0000 mg | ORAL_CAPSULE | Freq: Two times a day (BID) | ORAL | Status: DC
  Administered 2019-07-14 – 2019-07-16 (×4): 100 mg via ORAL
  Filled 2019-07-14 (×4): qty 1

## 2019-07-14 MED ORDER — TIZANIDINE HCL 2 MG PO TABS
2.0000 mg | ORAL_TABLET | Freq: Four times a day (QID) | ORAL | 0 refills | Status: DC | PRN
Start: 2019-07-14 — End: 2020-09-08

## 2019-07-14 MED ORDER — TRANEXAMIC ACID-NACL 1000-0.7 MG/100ML-% IV SOLN
1000.0000 mg | INTRAVENOUS | Status: AC
  Administered 2019-07-14: 1000 mg via INTRAVENOUS
  Filled 2019-07-14: qty 100

## 2019-07-14 MED ORDER — MIDAZOLAM HCL 2 MG/2ML IJ SOLN
1.0000 mg | INTRAMUSCULAR | Status: DC
  Administered 2019-07-14: 2 mg via INTRAVENOUS
  Filled 2019-07-14: qty 2

## 2019-07-14 MED ORDER — ONDANSETRON HCL 4 MG/2ML IJ SOLN
INTRAMUSCULAR | Status: AC
  Filled 2019-07-14: qty 2

## 2019-07-14 MED ORDER — KCL IN DEXTROSE-NACL 20-5-0.45 MEQ/L-%-% IV SOLN
INTRAVENOUS | Status: DC
  Filled 2019-07-14 (×2): qty 1000

## 2019-07-14 MED ORDER — ALUM & MAG HYDROXIDE-SIMETH 200-200-20 MG/5ML PO SUSP: 30.0000 mL | ORAL | Status: DC | PRN

## 2019-07-14 MED ORDER — FENTANYL CITRATE (PF) 100 MCG/2ML IJ SOLN
INTRAMUSCULAR | Status: AC
  Filled 2019-07-14: qty 2

## 2019-07-14 MED ORDER — CEFAZOLIN SODIUM-DEXTROSE 2-4 GM/100ML-% IV SOLN
2.0000 g | INTRAVENOUS | Status: AC
  Administered 2019-07-14: 2 g via INTRAVENOUS
  Filled 2019-07-14: qty 100

## 2019-07-14 MED ORDER — LIDOCAINE 2% (20 MG/ML) 5 ML SYRINGE
INTRAMUSCULAR | Status: AC
  Filled 2019-07-14: qty 5

## 2019-07-14 MED ORDER — OXYCODONE HCL 5 MG PO TABS
5.0000 mg | ORAL_TABLET | ORAL | Status: DC | PRN
  Administered 2019-07-14 – 2019-07-16 (×5): 10 mg via ORAL
  Filled 2019-07-14 (×5): qty 2

## 2019-07-14 MED ORDER — FENTANYL CITRATE (PF) 100 MCG/2ML IJ SOLN
50.0000 ug | INTRAMUSCULAR | Status: DC
  Administered 2019-07-14: 50 ug via INTRAVENOUS
  Filled 2019-07-14: qty 2

## 2019-07-14 MED ORDER — HYDROMORPHONE HCL 1 MG/ML IJ SOLN: 0.2500 mg | INTRAMUSCULAR | Status: DC | PRN

## 2019-07-14 MED ORDER — FENTANYL CITRATE (PF) 100 MCG/2ML IJ SOLN
INTRAMUSCULAR | Status: DC | PRN
  Administered 2019-07-14: 50 ug via INTRAVENOUS

## 2019-07-14 MED ORDER — ACETAMINOPHEN 325 MG PO TABS
325.0000 mg | ORAL_TABLET | Freq: Four times a day (QID) | ORAL | Status: DC | PRN
  Administered 2019-07-16: 650 mg via ORAL
  Filled 2019-07-14: qty 2

## 2019-07-14 MED ORDER — MIDAZOLAM HCL 2 MG/2ML IJ SOLN
INTRAMUSCULAR | Status: AC
  Filled 2019-07-14: qty 2

## 2019-07-14 MED ORDER — METHOCARBAMOL 1000 MG/10ML IJ SOLN
500.0000 mg | Freq: Four times a day (QID) | INTRAVENOUS | Status: DC | PRN
  Filled 2019-07-14: qty 5

## 2019-07-14 MED ORDER — FENTANYL CITRATE (PF) 250 MCG/5ML IJ SOLN
INTRAMUSCULAR | Status: AC
  Filled 2019-07-14: qty 5

## 2019-07-14 MED ORDER — PROPOFOL 10 MG/ML IV BOLUS
INTRAVENOUS | Status: DC | PRN
  Administered 2019-07-14: 20 mg via INTRAVENOUS

## 2019-07-14 MED ORDER — HYDROMORPHONE HCL 1 MG/ML IJ SOLN: 0.5000 mg | INTRAMUSCULAR | Status: DC | PRN

## 2019-07-14 MED ORDER — METHOCARBAMOL 500 MG PO TABS
500.0000 mg | ORAL_TABLET | Freq: Four times a day (QID) | ORAL | Status: DC | PRN
  Administered 2019-07-15: 500 mg via ORAL
  Filled 2019-07-14: qty 1

## 2019-07-14 MED ORDER — POVIDONE-IODINE 10 % EX SWAB
2.0000 "application " | Freq: Once | CUTANEOUS | Status: AC
  Administered 2019-07-14: 2 via TOPICAL

## 2019-07-14 MED ORDER — ASPIRIN EC 81 MG PO TBEC
81.0000 mg | DELAYED_RELEASE_TABLET | Freq: Two times a day (BID) | ORAL | 0 refills | Status: DC
Start: 2019-07-14 — End: 2022-08-28

## 2019-07-14 MED ORDER — CHLORHEXIDINE GLUCONATE 0.12 % MT SOLN
15.0000 mL | Freq: Once | OROMUCOSAL | Status: AC
  Administered 2019-07-14: 15 mL via OROMUCOSAL

## 2019-07-14 MED ORDER — ONDANSETRON HCL 4 MG/2ML IJ SOLN: 4.0000 mg | Freq: Four times a day (QID) | INTRAMUSCULAR | Status: DC | PRN

## 2019-07-14 MED ORDER — PROPOFOL 10 MG/ML IV BOLUS
INTRAVENOUS | Status: AC
  Filled 2019-07-14: qty 20

## 2019-07-14 MED ORDER — DEXAMETHASONE SODIUM PHOSPHATE 10 MG/ML IJ SOLN
INTRAMUSCULAR | Status: AC
  Filled 2019-07-14: qty 1

## 2019-07-14 MED ORDER — OXYCODONE-ACETAMINOPHEN 5-325 MG PO TABS: 1.0000 | ORAL_TABLET | ORAL | 0 refills | Status: DC | PRN

## 2019-07-14 MED ORDER — HYDROGEN PEROXIDE 3 % EX SOLN
CUTANEOUS | Status: DC | PRN
  Administered 2019-07-14: 1

## 2019-07-14 MED ORDER — POLYETHYLENE GLYCOL 3350 17 G PO PACK: 17.0000 g | PACK | Freq: Every day | ORAL | Status: DC | PRN

## 2019-07-14 MED ORDER — PANTOPRAZOLE SODIUM 40 MG PO TBEC
40.0000 mg | DELAYED_RELEASE_TABLET | Freq: Every day | ORAL | Status: DC
  Administered 2019-07-14 – 2019-07-15 (×2): 40 mg via ORAL
  Filled 2019-07-14 (×2): qty 1

## 2019-07-14 MED ORDER — FLEET ENEMA 7-19 GM/118ML RE ENEM: 1.0000 | ENEMA | Freq: Once | RECTAL | Status: DC | PRN

## 2019-07-14 SURGICAL SUPPLY — 51 items
BNDG COHESIVE 4X5 TAN STRL (GAUZE/BANDAGES/DRESSINGS) IMPLANT
BNDG ELASTIC 4X5.8 VLCR STR LF (GAUZE/BANDAGES/DRESSINGS) ×2 IMPLANT
BNDG ELASTIC 6X5.8 VLCR STR LF (GAUZE/BANDAGES/DRESSINGS) ×2 IMPLANT
BNDG GAUZE ELAST 4 BULKY (GAUZE/BANDAGES/DRESSINGS) IMPLANT
COVER SURGICAL LIGHT HANDLE (MISCELLANEOUS) ×3 IMPLANT
COVER WAND RF STERILE (DRAPES) ×3 IMPLANT
CUFF TOURN SGL QUICK 34 (TOURNIQUET CUFF) ×3
CUFF TRNQT CYL 34X4.125X (TOURNIQUET CUFF) IMPLANT
DRSG AQUACEL AG ADV 3.5X14 (GAUZE/BANDAGES/DRESSINGS) ×3 IMPLANT
DURAPREP 26ML APPLICATOR (WOUND CARE) ×3 IMPLANT
ELECT REM PT RETURN 15FT ADLT (MISCELLANEOUS) IMPLANT
EVACUATOR 1/8 PVC DRAIN (DRAIN) ×2 IMPLANT
GAUZE SPONGE 4X4 12PLY STRL (GAUZE/BANDAGES/DRESSINGS) ×2 IMPLANT
GAUZE XEROFORM 1X8 LF (GAUZE/BANDAGES/DRESSINGS) ×2 IMPLANT
GLOVE BIO SURGEON STRL SZ7.5 (GLOVE) ×3 IMPLANT
GLOVE BIO SURGEON STRL SZ8.5 (GLOVE) ×3 IMPLANT
GLOVE BIOGEL PI IND STRL 8 (GLOVE) ×1 IMPLANT
GLOVE BIOGEL PI IND STRL 9 (GLOVE) ×1 IMPLANT
GLOVE BIOGEL PI INDICATOR 8 (GLOVE) ×2
GLOVE BIOGEL PI INDICATOR 9 (GLOVE) ×2
GOWN STRL REUS W/ TWL LRG LVL3 (GOWN DISPOSABLE) ×3 IMPLANT
GOWN STRL REUS W/ TWL XL LVL3 (GOWN DISPOSABLE) ×1 IMPLANT
GOWN STRL REUS W/TWL LRG LVL3 (GOWN DISPOSABLE) ×9
GOWN STRL REUS W/TWL XL LVL3 (GOWN DISPOSABLE) ×3
HANDPIECE INTERPULSE COAX TIP (DISPOSABLE)
HOOD PEEL AWAY FLYTE STAYCOOL (MISCELLANEOUS) ×6 IMPLANT
KIT TURNOVER KIT A (KITS) ×3 IMPLANT
MANIFOLD NEPTUNE II (INSTRUMENTS) ×3 IMPLANT
NEEDLE HYPO 22GX1.5 SAFETY (NEEDLE) ×4 IMPLANT
NS IRRIG 1000ML POUR BTL (IV SOLUTION) ×3 IMPLANT
PACK TOTAL KNEE CUSTOM (KITS) ×3 IMPLANT
PAD ARMBOARD 7.5X6 YLW CONV (MISCELLANEOUS) ×6 IMPLANT
PENCIL SMOKE EVACUATOR (MISCELLANEOUS) ×3 IMPLANT
PLATE ROT INSERT 12.5MM SIZE 3 (Plate) ×2 IMPLANT
SET HNDPC FAN SPRY TIP SCT (DISPOSABLE) IMPLANT
SUT ETHILON 3 0 PS 1 (SUTURE) IMPLANT
SUT VIC AB 1 CT1 27 (SUTURE) ×3
SUT VIC AB 1 CT1 27XBRD ANTBC (SUTURE) IMPLANT
SUT VIC AB 1 CT1 36 (SUTURE) ×3 IMPLANT
SUT VIC AB 1 CTX 36 (SUTURE) ×3
SUT VIC AB 1 CTX36XBRD ANBCTR (SUTURE) IMPLANT
SUT VIC AB 2-0 CT1 27 (SUTURE) ×3
SUT VIC AB 2-0 CT1 TAPERPNT 27 (SUTURE) IMPLANT
SUT VIC AB 3-0 CT1 27 (SUTURE) ×9
SUT VIC AB 3-0 CT1 TAPERPNT 27 (SUTURE) ×3 IMPLANT
SWAB CULTURE ESWAB REG 1ML (MISCELLANEOUS) IMPLANT
SYR 10ML LL (SYRINGE) ×4 IMPLANT
TRAY FOLEY MTR SLVR 14FR STAT (SET/KITS/TRAYS/PACK) ×2 IMPLANT
WATER STERILE IRR 1000ML POUR (IV SOLUTION) ×3 IMPLANT
WRAP KNEE MAXI GEL POST OP (GAUZE/BANDAGES/DRESSINGS) ×2 IMPLANT
YANKAUER SUCT BULB TIP NO VENT (SUCTIONS) ×3 IMPLANT

## 2019-07-14 NOTE — Discharge Instructions (Signed)

## 2019-07-14 NOTE — Anesthesia Procedure Notes (Addendum)
Spinal  Patient location during procedure: OR Start time: 07/14/2019 12:58 PM End time: 07/14/2019 1:01 PM Staffing Performed: anesthesiologist  Anesthesiologist: Lillia Abed, MD Preanesthetic Checklist Completed: patient identified, IV checked, risks and benefits discussed, surgical consent, monitors and equipment checked, pre-op evaluation and timeout performed Spinal Block Patient position: sitting Prep: ChloraPrep Patient monitoring: cardiac monitor, continuous pulse ox and blood pressure Approach: right paramedian Location: L3-4 Needle Needle type: Pencan  Needle gauge: 24 G Needle length: 9 cm Needle insertion depth: 7 cm

## 2019-07-14 NOTE — Op Note (Addendum)
DATE OF PROCEDURE: 07/14/2019  PREOPERATIVE DIAGNOSIS: Infected Left Total Knee  Estimated body mass index is 35.23 kg/(m^2) as calculated from the following:  Height as of this encounter: 6' 2.5" (1.892 m).  Weight as of this encounter: 126.1 kg (278 lb).  POSTOPERATIVE DIAGNOSIS: Infected Left Total Knee  PROCEDURE: Radical irrigation and debridement of infected total knee arthroplasty with revision of tibial bearing to a number 3 x 12.5 mm Sigma RP bearing SURGEON: DTOIZ,TIWPY J  ASSISTANT: [employer] (present throughout entire procedure and necessary for timely completion of the procedure)  ANESTHESIA: Spinal, abductor canal block DRAINS: foley, 2 medium hemovac in knee joint, one small Hemovac in the subcutaneous tissue  TOURNIQUET TIME: None COMPLICATIONS: None SPECIMENS: Infected Synovial fluid and tissue for Gram stain and culture.  INDICATIONS FOR PROCEDURE: Patient is status post total knee arthroplasty 10/17/2017 did well until 6 days ago when she developed swelling and pain in the knee acutely.  She was seen by her surgeon the next day and he aspirated 30 cc of brownish purulent fluid from the knee that was sent off for Gram stain and culture cell count was 66,000 and on the second day cultures came back positive for Streptococcus mitis, oral organism.  Patient was admitted for IV antibiotics to get the knee to cool off and prepared for irrigation debridement and polyethylene bearing revision.  Risk and benefits of surgery and the discussed and all questions answered.  The organism is sensitive to Rocephin she has a PICC line placed and will be on IV antibiotics for minimum 6 weeks. DESCRIPTION OF PROCEDURE: The patient identified by armband, and taken to the operating room, appropriate anesthetic  monitors were attached and spinal anesthesia induced with  the patient in supine position, Foley catheter was inserted. Tourniquet  applied high to the operative thigh. Lateral post  and foot positioner  applied to the table, the lower extremity was then prepped and draped  in usual sterile fashion from the ankle to the tourniquet. Time-out procedure was performed. We began the operation by making the anterior midline incision starting at handbreadth above the patella going over the patella 1 cm medial to and 6 cm distal to the tibial tubercle, reproducing the old incision. Immediately encountered large amounts of subcutaneous hematoma were evacuated and there was obvious communication distally between the prepatellar space and the joint itself. Small bleeders in the skin and the  subcutaneous tissue identified and cauterized. A medial parapatellar arthrotomy was accomplished. We immediately encountered cloudy purulent fluid. The patella was subluxed laterally and the superficial medial collateral ligament was then elevated from anterior to posterior along the proximal flare of the tibia. We then set about sharply excising all inflamed synovium that was visible with the patella everted and the knee flexed. Polyethylene bearing was then removed sharply with an osteotome, preserving the stem. With the knee hyperflexed. We sharply removed also inflamed synovium medially laterally and posteriorly, and then irrigated alternating with pulse lavage,  and hydrogen peroxide again for 1 minute intervals. This was continued for 2 full cycles. After thorough irrigation and debridement 2 large bore Hemovac drains were placed in the wound.  We then inserted a new number 3 x 12.5 mm Depuy Sigma RP bearing.  The patella was reduced and the knee was taken the range of motion from 0 to 120 degrees without difficulty.  We then closed in layers with running 1 Vicryl in the capsule,  3-0 undyed Vicryl in the subcutaneous tissue and  skin. A dressing of Aquacel.The patient was then awakened, extubated, and taken to recovery room without difficulty.  Frederik Pear J  10/11/2012, 2:57 PM

## 2019-07-14 NOTE — Progress Notes (Signed)
Orthopedic Tech Progress Note Patient Details:  Meredith Rose 01-03-1951 [id number] Bone foam left on bed Patient ID: Meredith Rose, female   DOB: 06/24/1950, 69 y.o.   MRN: [id number]   [place] November 07/14/2019, 6:43 PM

## 2019-07-14 NOTE — Evaluation (Signed)
Physical Therapy Evaluation Patient Details Name: Meredith Rose MRN: [id number] DOB: [birth year] Today's Date: 07/14/2019   History of Present Illness   Patient is 69 y.o. female s/p Lt TKR (I&D with tibial poly exchange)  on 07/14/19 with PMH significant for ovarian cancer, HTN, OA, anxiety, Rt TKA in 2013, Lt TKA in 2019.  Clinical Impression  AFTEN LIPSEY is a 69 y.o. female POD 0 s/p Lt TKR due to septic knee arthroplasty. Patient reports independence with mobility at baseline. Patient is now limited by functional impairments (see PT problem list below) and requires min assist for transfers and gait with RW. Patient was able to ambulate ~40 feet with RW and min assist. Patient instructed in exercise to facilitate ROM and circulation. Patient will benefit from continued skilled PT interventions to address impairments and progress towards PLOF. Acute PT will follow to progress mobility and stair training in preparation for safe discharge home.     Follow Up Recommendations Outpatient PT;Home health PT;Follow surgeon's recommendation for DC plan and follow-up therapies    Equipment Recommendations  None recommended by PT    Recommendations for Other Services       Precautions / Restrictions Precautions Precautions: Fall Restrictions Weight Bearing Restrictions: No Other Position/Activity Restrictions: WBAT      Mobility  Bed Mobility Overal bed mobility: Needs Assistance Bed Mobility: Supine to Sit     Supine to sit: Min assist;HOB elevated     General bed mobility comments: cues for use of bed rail, light assist to raise trunk upright.  Transfers Overall transfer level: Needs assistance Equipment used: Rolling walker (2 wheeled) Transfers: Sit to/from Stand Sit to Stand: Min assist         General transfer comment: cues for technique/hand placement, light assist to initaite and complete rise.   Ambulation/Gait Ambulation/Gait assistance: Min assist Gait  Distance (Feet): 40 Feet Assistive device: Rolling walker (2 wheeled) Gait Pattern/deviations: Step-to pattern;Decreased stride length;Decreased weight shift to left Gait velocity: decreased   General Gait Details: pt wtih slow cadence. ceus fro step pattern and safe proximity to RW. no overt LOB noted or buckling at Lt knee.  Stairs            Wheelchair Mobility    Modified Rankin (Stroke Patients Only)       Balance Overall balance assessment: Needs assistance Sitting-balance support: Feet supported Sitting balance-Leahy Scale: Good     Standing balance support: During functional activity;Bilateral upper extremity supported Standing balance-Leahy Scale: Poor            Pertinent Vitals/Pain Pain Assessment: 0-10 Pain Score: 3  Pain Location: Lt knee Pain Descriptors / Indicators: Aching;Discomfort Pain Intervention(s): Limited activity within patient's tolerance;Monitored during session;Repositioned;Ice applied    Home Living Family/patient expects to be discharged to:: Private residence Living Arrangements: Spouse/significant other Available Help at Discharge: Family Type of Home: House Home Access: Stairs to enter Entrance Stairs-Rails: None Entrance Stairs-Number of Steps: 1 Home Layout: One level Home Equipment: Environmental consultant - 2 wheels;Shower seat      Prior Function Level of Independence: Independent               Hand Dominance   Dominant Hand: Right    Extremity/Trunk Assessment   Upper Extremity Assessment Upper Extremity Assessment: Overall WFL for tasks assessed    Lower Extremity Assessment Lower Extremity Assessment: LLE deficits/detail LLE Deficits / Details: good quad activation, no extensor lag with SLR LLE Sensation: WNL LLE Coordination: WNL  Cervical / Trunk Assessment Cervical / Trunk Assessment: Normal  Communication   Communication: No difficulties  Cognition Arousal/Alertness: Awake/alert Behavior During Therapy:  WFL for tasks assessed/performed Overall Cognitive Status: Within Functional Limits for tasks assessed                     General Comments      Exercises Total Joint Exercises Ankle Circles/Pumps: AROM;Both;20 reps;Seated Quad Sets: AROM;Left;10 reps;Seated   Assessment/Plan    PT Assessment Patient needs continued PT services  PT Problem List Decreased strength;Decreased activity tolerance;Decreased range of motion;Decreased balance;Decreased mobility;Decreased knowledge of use of DME       PT Treatment Interventions DME instruction;Gait training;Stair training;Functional mobility training;Therapeutic activities;Therapeutic exercise;Balance training;Patient/family education    PT Goals (Current goals can be found in the Care Plan section)  Acute Rehab PT Goals Patient Stated Goal: get home and back to independence and gardening PT Goal Formulation: With patient Time For Goal Achievement: 07/21/19 Potential to Achieve Goals: Good    Frequency 7X/week    AM-PAC PT "6 Clicks" Mobility  Outcome Measure Help needed turning from your back to your side while in a flat bed without using bedrails?: A Little Help needed moving from lying on your back to sitting on the side of a flat bed without using bedrails?: A Little Help needed moving to and from a bed to a chair (including a wheelchair)?: A Little Help needed standing up from a chair using your arms (e.g., wheelchair or bedside chair)?: A Little Help needed to walk in hospital room?: A Little Help needed climbing 3-5 steps with a railing? : A Little 6 Click Score: 18    End of Session Equipment Utilized During Treatment: Gait belt Activity Tolerance: Patient tolerated treatment well Patient left: in chair;with call bell/phone within reach;with chair alarm set Nurse Communication: Mobility status PT Visit Diagnosis: Muscle weakness (generalized) (M62.81);Difficulty in walking, not elsewhere classified (R26.2)     Time: 3545-6256 PT Time Calculation (min) (ACUTE ONLY): 34 min   Charges:   PT Evaluation $PT Eval Low Complexity: 1 Low PT Treatments $Gait Training: 8-22 mins       Verner Mould, DPT Acute Rehabilitation Services  Office [phone] Pager [phone]  07/14/2019 6:58 PM

## 2019-07-14 NOTE — Interval H&P Note (Signed)
History and Physical Interval Note:  07/14/2019 8:30 AM  Meredith Rose  has presented today for surgery, with the diagnosis of INFECTED LEFT KNEE REPLACEMENT.  The various methods of treatment have been discussed with the patient and family. After consideration of risks, benefits and other options for treatment, the patient has consented to  Procedure(s): LEFT KNEE RADICAL IRRIGATION AND DEBRIDEMENT WITH POLY REVISION LEFT KNEE REPLACEMENT (Left) as a surgical intervention.  The patient's history has been reviewed, patient examined, no change in status, stable for surgery.  I have reviewed the patient's chart and labs.  Questions were answered to the patient's satisfaction.     [name]

## 2019-07-14 NOTE — Anesthesia Preprocedure Evaluation (Signed)
Anesthesia Evaluation  Patient identified by MRN, date of birth, ID band Patient awake    Reviewed: Allergy & Precautions, NPO status , Patient's Chart, lab work & pertinent test results  Airway Mallampati: I  TM Distance: >3 FB Neck ROM: Full    Dental   Pulmonary    Pulmonary exam normal        Cardiovascular hypertension, Pt. on medications Normal cardiovascular exam     Neuro/Psych Anxiety    GI/Hepatic   Endo/Other    Renal/GU      Musculoskeletal   Abdominal   Peds  Hematology   Anesthesia Other Findings   Reproductive/Obstetrics                             Anesthesia Physical Anesthesia Plan  ASA: II  Anesthesia Plan: Spinal   Post-op Pain Management:  Regional for Post-op pain   Induction: Intravenous  PONV Risk Score and Plan: 2 and Ondansetron and Treatment may vary due to age or medical condition  Airway Management Planned: Nasal Cannula  Additional Equipment:   Intra-op Plan:   Post-operative Plan:   Informed Consent: I have reviewed the patients History and Physical, chart, labs and discussed the procedure including the risks, benefits and alternatives for the proposed anesthesia with the patient or authorized representative who has indicated his/her understanding and acceptance.       Plan Discussed with: CRNA and Surgeon  Anesthesia Plan Comments:         Anesthesia Quick Evaluation

## 2019-07-14 NOTE — Progress Notes (Signed)
AssistedDr. Ossey with left, ultrasound guided, adductor canal block. Side rails up, monitors on throughout procedure. See vital signs in flow sheet. Tolerated Procedure well.  

## 2019-07-14 NOTE — Plan of Care (Signed)
Plan of care reviewed and discussed with the patient. 

## 2019-07-14 NOTE — Anesthesia Procedure Notes (Signed)
Anesthesia Regional Block: Adductor canal block   Pre-Anesthetic Checklist: ,, timeout performed, Correct Patient, Correct Site, Correct Laterality, Correct Procedure, Correct Position, site marked, Risks and benefits discussed,  Surgical consent,  Pre-op evaluation,  At surgeon's request and post-op pain management  Laterality: Left  Prep: chloraprep       Needles:  Injection technique: Single-shot  Needle Type: Echogenic Stimulator Needle     Needle Length: 9cm  Needle Gauge: 21     Additional Needles:   Narrative:  Start time: 07/14/2019 12:20 PM End time: 07/14/2019 12:30 PM Injection made incrementally with aspirations every 5 mL.  Performed by: Personally  Anesthesiologist: Lillia Abed, MD  Additional Notes: Monitors applied. Patient sedated. Sterile prep and drape,hand hygiene and sterile gloves were used. Relevant anatomy identified.Needle position confirmed.Local anesthetic injected incrementally after negative aspiration. Local anesthetic spread visualized around nerve(s). Vascular puncture avoided. No complications. Image printed for medical record.The patient tolerated the procedure well.    Lillia Abed MD

## 2019-07-14 NOTE — Transfer of Care (Signed)
Immediate Anesthesia Transfer of Care Note  Patient: Meredith Rose  Procedure(s) Performed: LEFT KNEE RADICAL IRRIGATION AND DEBRIDEMENT WITH POLY REVISION LEFT KNEE REPLACEMENT (Left Knee)  Patient Location: PACU  Anesthesia Type:Spinal  Level of Consciousness: awake, alert  and oriented  Airway & Oxygen Therapy: Patient Spontanous Breathing and Patient connected to face mask oxygen  Post-op Assessment: Report given to RN and Post -op Vital signs reviewed and stable  Post vital signs: Reviewed and stable  Last Vitals:  Vitals Value Taken Time  BP 100/64 07/14/19 1509  Temp    Pulse 51 07/14/19 1513  Resp 20 07/14/19 1513  SpO2 96 % 07/14/19 1513  Vitals shown include unvalidated device data.  Last Pain:  Vitals:   07/14/19 1240  TempSrc:   PainSc: 0-No pain      Patients Stated Pain Goal: 2 (09/47/09 6283)  Complications: No complications documented.

## 2019-07-14 NOTE — Anesthesia Postprocedure Evaluation (Signed)
Anesthesia Post Note  Patient: Meredith Rose  Procedure(s) Performed: LEFT KNEE RADICAL IRRIGATION AND DEBRIDEMENT WITH POLY REVISION LEFT KNEE REPLACEMENT (Left Knee)     Patient location during evaluation: PACU Anesthesia Type: Spinal Level of consciousness: oriented and awake and alert Pain management: pain level controlled Vital Signs Assessment: post-procedure vital signs reviewed and stable Respiratory status: spontaneous breathing, respiratory function stable and patient connected to nasal cannula oxygen Cardiovascular status: blood pressure returned to baseline and stable Postop Assessment: no headache, no backache and no apparent nausea or vomiting Anesthetic complications: no   No complications documented.  Last Vitals:  Vitals:   07/14/19 1515 07/14/19 1530  BP: 117/71 113/64  Pulse: (!) 54 (!) 55  Resp: 16 18  Temp:    SpO2: 97% 94%    Last Pain:  Vitals:   07/14/19 1530  TempSrc:   PainSc: Asleep    LLE Motor Response: Purposeful movement (07/14/19 1530)   RLE Motor Response: Purposeful movement (07/14/19 1530)   L Sensory Level: S1-Sole of foot, small toes (07/14/19 1530) R Sensory Level: S1-Sole of foot, small toes (07/14/19 1530)  , DAVID

## 2019-07-14 NOTE — Care Management Important Message (Signed)
Important Message  Patient Details IM Letter given to [place] Case Manager to present to the Patient Name: Meredith Rose MRN: [id number] Date of Birth: Dec 16, 1950   Medicare Important Message Given:  Yes     [name] 07/14/2019, 3:35 PM

## 2019-07-15 ENCOUNTER — Encounter (HOSPITAL_COMMUNITY): Payer: Medicare Other

## 2019-07-15 ENCOUNTER — Encounter (HOSPITAL_COMMUNITY): Payer: Self-pay | Admitting: Orthopedic Surgery

## 2019-07-15 DIAGNOSIS — M00862 Arthritis due to other bacteria, left knee: Secondary | ICD-10-CM

## 2019-07-15 LAB — CBC
HCT: 34.2 % — ABNORMAL LOW (ref 36.0–46.0)
Hemoglobin: 11 g/dL — ABNORMAL LOW (ref 12.0–15.0)
MCH: 27.9 pg (ref 26.0–34.0)
MCHC: 32.2 g/dL (ref 30.0–36.0)
MCV: 86.8 fL (ref 80.0–100.0)
Platelets: 352 10*3/uL (ref 150–400)
RBC: 3.94 MIL/uL (ref 3.87–5.11)
RDW: 13.7 % (ref 11.5–15.5)
WBC: 16.7 10*3/uL — ABNORMAL HIGH (ref 4.0–10.5)
nRBC: 0 % (ref 0.0–0.2)

## 2019-07-15 LAB — BASIC METABOLIC PANEL
Anion gap: 8 (ref 5–15)
BUN: 14 mg/dL (ref 8–23)
CO2: 28 mmol/L (ref 22–32)
Calcium: 9.5 mg/dL (ref 8.9–10.3)
Chloride: 104 mmol/L (ref 98–111)
Creatinine, Ser: 0.6 mg/dL (ref 0.44–1.00)
GFR calc Af Amer: >60 mL/min (ref >60)
GFR calc non Af Amer: >60 mL/min (ref >60)
Glucose, Bld: 137 mg/dL — ABNORMAL HIGH (ref 70–99)
Potassium: 4.6 mmol/L (ref 3.5–5.1)
Sodium: 140 mmol/L (ref 135–145)

## 2019-07-15 NOTE — Progress Notes (Signed)
Physical Therapy Treatment Patient Details Name: Meredith Rose MRN: [id number] DOB: May 26, 1950 Today's Date: 07/15/2019    History of Present Illness Patient is 69 y.o. female s/p Lt TKR (I&D with tibial poly exchange)  on 07/14/19 with PMH significant for ovarian cancer, HTN, OA, anxiety, Rt TKA in 2013, Lt TKA in 2019.    PT Comments    POD # 1 am session Pt stated "the doctor told me not to do too many exercises and not to bend my knee".  Pt is a Revision. Per chart review, nothing is written and consulted LPT.  Advised pt to "start out easy"  And let "pain be your guide" (tolerance).   Assisted with amb in hallway then Then returned to room to perform some TE's following HEP handout.  Instructed on proper tech, freq as well as use of ICE.   Pt plans to D/C to home on IV antiobitics  Follow Up Recommendations  Follow surgeon's recommendation for DC plan and follow-up therapies     Equipment Recommendations  None recommended by PT    Recommendations for Other Services       Precautions / Restrictions Precautions Precautions: Fall Restrictions Weight Bearing Restrictions: No Other Position/Activity Restrictions: WBAT    Mobility  Bed Mobility               General bed mobility comments: OOB in recliner  Transfers Overall transfer level: Needs assistance Equipment used: Rolling walker (2 wheeled) Transfers: Sit to/from Stand Sit to Stand: Min guard;Independent         General transfer comment: 25% VC's on safety with turns  Ambulation/Gait Ambulation/Gait assistance: Supervision;Min guard Gait Distance (Feet): 45 Feet Assistive device: Rolling walker (2 wheeled) Gait Pattern/deviations: Step-to pattern;Decreased stride length;Decreased weight shift to left Gait velocity: decreased   General Gait Details: 25% VC's on safety with turns and increased time   Stairs             Wheelchair Mobility    Modified Rankin (Stroke Patients Only)        Balance                                            Cognition Arousal/Alertness: Awake/alert Behavior During Therapy: WFL for tasks assessed/performed Overall Cognitive Status: Within Functional Limits for tasks assessed                                        Exercises   Total Knee Replacement TE's following HEP handout 10 reps B LE ankle pumps 05 reps towel squeezes 05 reps knee presses 05 reps heel slides  05 reps SLR's 05 reps ABD Educated on use of gait belt to assist with TE's Followed by ICE     General Comments        Pertinent Vitals/Pain Pain Assessment: 0-10 Pain Score: 6  Pain Location: Lt knee Pain Descriptors / Indicators: Aching;Discomfort Pain Intervention(s): Monitored during session;Premedicated before session;Repositioned;Ice applied    Home Living                      Prior Function            PT Goals (current goals can now be found in the care plan section) Progress towards PT goals: Progressing  toward goals    Frequency    7X/week      PT Plan Current plan remains appropriate    Co-evaluation              AM-PAC PT "6 Clicks" Mobility   Outcome Measure  Help needed turning from your back to your side while in a flat bed without using bedrails?: A Little Help needed moving from lying on your back to sitting on the side of a flat bed without using bedrails?: A Little Help needed moving to and from a bed to a chair (including a wheelchair)?: A Little Help needed standing up from a chair using your arms (e.g., wheelchair or bedside chair)?: A Little Help needed to walk in hospital room?: A Little Help needed climbing 3-5 steps with a railing? : A Little 6 Click Score: 18    End of Session Equipment Utilized During Treatment: Gait belt   Patient left: in chair;with call bell/phone within reach;with chair alarm set Nurse Communication: Mobility status PT Visit Diagnosis:  Muscle weakness (generalized) (M62.81);Difficulty in walking, not elsewhere classified (R26.2)     Time: 1010-1035 PT Time Calculation (min) (ACUTE ONLY): 25 min  Charges:  $Gait Training: 8-22 mins $Therapeutic Exercise: 8-22 mins                     Rica Koyanagi  PTA Acute  Rehabilitation Services Pager      [phone] Office      [phone]

## 2019-07-15 NOTE — Progress Notes (Signed)
PATIENT ID: Meredith Rose  MRN: [id number]  DOB/AGE:  1950-09-15 / 69 y.o.  1 Day Post-Op Procedure(s) (LRB): LEFT KNEE RADICAL IRRIGATION AND DEBRIDEMENT WITH POLY REVISION LEFT KNEE REPLACEMENT (Left)    PROGRESS NOTE Subjective: Patient is alert, oriented, no Nausea, no Vomiting, yes passing gas. Taking PO well. Denies SOB, Chest or Calf Pain. Using Incentive Spirometer, PAS in place. Ambulate 40', Patient reports pain as 2/10 .    Objective: Vital signs in last 24 hours: Vitals:   07/14/19 2000 07/14/19 2112 07/15/19 0155 07/15/19 0516  BP: (Abnormal) 107/55 (Abnormal) 110/58 103/60 107/61  Pulse: 65 63 (Abnormal) 57 (Abnormal) 55  Resp: 16 16 16 16   Temp: 97.9 F (36.6 C) 97.9 F (36.6 C) 97.6 F (36.4 C) 97.6 F (36.4 C)  TempSrc: Oral Oral Oral Oral  SpO2: (Abnormal) 89% 93% (Abnormal) 88% (Abnormal) 85%  Weight:      Height:          Intake/Output from previous day: I/O last 3 completed shifts: In: 5039 [P.O.:1440; I.V.:3357.9; IV Piggyback:241] Out: 5038 [Urine:1550; Drains:100; Blood:25]   Intake/Output this shift: No intake/output data recorded.   LABORATORY DATA: Recent Labs    07/15/19 0500  WBC 16.7*  HGB 11.0*  HCT 34.2*  PLT 352  NA 140  K 4.6  CL 104  CO2 28  BUN 14  CREATININE 0.60  GLUCOSE 137*  CALCIUM 9.5    Examination: Neurologically intact ABD soft Neurovascular intact Sensation intact distally Intact pulses distally Dorsiflexion/Plantar flexion intact Incision: dressing C/D/I No cellulitis present Compartment soft} Drain 30cc last shift  Assessment:   1 Day Post-Op Procedure(s) (LRB): LEFT KNEE RADICAL IRRIGATION AND DEBRIDEMENT WITH POLY REVISION LEFT KNEE REPLACEMENT (Left) ADDITIONAL DIAGNOSIS: Expected Acute Blood Loss Anemia, Hypertension Anticipated LOS equal to or greater than 2 midnights due to - Age 51 and older with one or more of the following:  - Obesity  - Expected need for hospital services (PT, OT,  Nursing) required for safe  discharge  - Anticipated need for postoperative skilled nursing care or inpatient rehab  - Active PJI    Plan: PT/OT WBAT, AROM and PROM  DVT Prophylaxis:  SCDx72hrs, ASA 81 mg BID x 2 weeks DISCHARGE PLAN: Home, after drains go quiet, ~1-2 days DISCHARGE NEEDS: Walker and 3-in-1 comode seat, home IV rocephin x 6 wks.     Meredith Rose 07/15/2019, 7:05 AM Patient ID: Meredith Rose, female   DOB: 1950-10-21, 69 y.o.   MRN: [id number]

## 2019-07-15 NOTE — TOC Initial Note (Signed)
Transition of Care [facility]) - Initial/Assessment Note    Patient Details  Name: Meredith Rose MRN: [id number] Date of Birth: [birth year]  Transition of Care [facility]) CM/SW Contact:    Lennart Pall, LCSW Phone Number: 07/15/2019, 11:45 AM  Clinical Narrative:    Met with pt to review d/c needs with anticipation may be ready for d/c by tomorrow.  Carolynn Sayers with [facility] to meet with pt today to review home IV abx management.  Pt denies any DME needs (has rw already and a modified/ higher commode).  Notes she has good support from her spouse at home.  Will continue to follow.               Expected Discharge Plan: [place] Barriers to Discharge: Continued Medical Work up   Patient Goals and CMS Choice Patient states their goals for this hospitalization and ongoing recovery are:: go home   Choice offered to / list presented to : Patient  Expected Discharge Plan and Services Expected Discharge Plan: [place] Acute Care Choice: [place] arrangements for the past 2 months: Single Family Home                 DME Arranged: N/A DME Agency: NA       HH Arranged: RN, IV Antibiotics [place] Agency: [place] ([place]) Date HH Agency Contacted: 07/14/19 Time [place]: 2841 Representative spoke with at [place]: Carolynn Sayers (IV abx) and Santiago Glad [facility])  Prior Living Arrangements/Services Living arrangements for the past 2 months: Single Family Home Lives with:: Spouse Patient language and need for interpreter reviewed:: Yes Do you feel safe going back to the place where you live?: Yes      Need for Family Participation in Patient Care: Yes (Comment) Care giver support system in place?: Yes (comment)   Criminal Activity/Legal Involvement Pertinent to Current Situation/Hospitalization: No - Comment as needed  Activities of Daily Living Home Assistive Devices/Equipment: Walker (specify type) ADL  Screening (condition at time of admission) Patient's cognitive ability adequate to safely complete daily activities?: Yes Is the patient deaf or have difficulty hearing?: No Does the patient have difficulty seeing, even when wearing glasses/contacts?: No Does the patient have difficulty concentrating, remembering, or making decisions?: No Patient able to express need for assistance with ADLs?: Yes Does the patient have difficulty dressing or bathing?: No Independently performs ADLs?: Yes (appropriate for developmental age) Does the patient have difficulty walking or climbing stairs?: No Weakness of Legs: None Weakness of Arms/Hands: None  Permission Sought/Granted                  Emotional Assessment Appearance:: Appears older than stated age Attitude/Demeanor/Rapport: Engaged, Gracious Affect (typically observed): Accepting, Pleasant Orientation: : Oriented to Self, Oriented to Place, Oriented to Situation, Oriented to  Time Alcohol / Substance Use: Not Applicable Psych Involvement: No (comment)  Admission diagnosis:  Septic arthritis of knee, left (HCC) [M00.9] Infection of total right knee replacement [facility]) [T84.53XA] Patient Active Problem List   Diagnosis Date Noted  . Infection of total right knee replacement ([place]) 07/14/2019  . Septic arthritis of knee, left ([place]) 07/11/2019  . S/P knee replacement 11/16/2017  . Hypokalemia 09/23/2011  . Hypotension 09/23/2011  . 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 Postop check 06/20/2011  . Bowel obstruction ([place]) 06/20/2011  . Ileus ([place]) 06/20/2011  . Hypertension   . Anxiety   . Hypothyroidism   .  Ruptured appendicitis s/p laparoscopic converted to open appendectomy and repair of enterotomy 06/06/11 06/03/2011   PCP:  Kelton Pillar, MD Pharmacy:   [id number] [place] [address]Phone: [phone]: [phone]    Social Determinants of Health  (SDOH) Interventions    Readmission Risk Interventions No flowsheet data found.

## 2019-07-15 NOTE — Progress Notes (Signed)
Subjective: No new complaints   Antibiotics:  Anti-infectives (From admission, onward)   Start     Dose/Rate Route Frequency Ordered Stop   07/14/19 0900  ceFAZolin (ANCEF) IVPB 2g/100 mL premix        2 g 200 mL/hr over 30 Minutes Intravenous On call to O.R. 07/14/19 0850 07/14/19 1304   07/12/19 1000  vancomycin (VANCOREADY) IVPB 750 mg/150 mL  Status:  Discontinued        750 mg 150 mL/hr over 60 Minutes Intravenous Every 12 hours 07/11/19 1556 07/13/19 0832   07/11/19 1600  cefTRIAXone (ROCEPHIN) 2 g in sodium chloride 0.9 % 100 mL IVPB     Discontinue     2 g 200 mL/hr over 30 Minutes Intravenous Daily 07/11/19 1454     07/11/19 1515  vancomycin (VANCOREADY) IVPB 2000 mg/400 mL        2,000 mg 200 mL/hr over 120 Minutes Intravenous  Once 07/11/19 1514 07/11/19 1818      Medications: Scheduled Meds: . amLODipine  5 mg Oral Daily  . aspirin  81 mg Oral BID  . docusate sodium  100 mg Oral BID  . enoxaparin (LOVENOX) injection  40 mg Subcutaneous Q24H  . gabapentin  100 mg Oral TID  . hydrochlorothiazide  25 mg Oral Daily  . levothyroxine  100 mcg Oral QAC breakfast  . pantoprazole  40 mg Oral Daily  . potassium chloride SA  20 mEq Oral BID  . propranolol  20 mg Oral Daily  . sertraline  100 mg Oral QHS  . simvastatin  20 mg Oral QHS   Continuous Infusions: . cefTRIAXone (ROCEPHIN)  IV 2 g (07/15/19 1014)  . dextrose 5 % and 0.45 % NaCl with KCl 20 mEq/L 30 mL/hr at 07/15/19 0634  . methocarbamol (ROBAXIN) IV     PRN Meds:.acetaminophen, alum & mag hydroxide-simeth, bisacodyl, diphenhydrAMINE, hydrocortisone cream, HYDROmorphone (DILAUDID) injection, menthol-cetylpyridinium **OR** phenol, methocarbamol **OR** methocarbamol (ROBAXIN) IV, metoCLOPramide **OR** metoCLOPramide (REGLAN) injection, ondansetron **OR** ondansetron (ZOFRAN) IV, oxyCODONE, polyethylene glycol, sodium phosphate    Objective: Weight change:   Intake/Output Summary (Last 24 hours)  at 07/15/2019 1324 Last data filed at 07/15/2019 1014 Gross per 24 hour  Intake 3449.46 ml  Output 1675 ml  Net 1774.46 ml   Blood pressure (!) 124/59, pulse (!) 58, temperature 97.8 F (36.6 C), resp. rate 16, height 5\' 5"  (1.651 m), weight 109.8 kg, SpO2 96 %. Temp:  [97.6 F (36.4 C)-98.5 F (36.9 C)] 97.8 F (36.6 C) (06/15 1104) Pulse Rate:  [53-65] 58 (06/15 1104) Resp:  [10-20] 16 (06/15 1104) BP: (100-128)/(55-75) 124/59 (06/15 1104) SpO2:  [85 %-99 %] 96 % (06/15 1104)  Physical Exam: General: Alert and awake, oriented x3, not in any acute distress. HEENT: anicteric sclera, EOMI CVS regular rate, normal  Chest: , no wheezing, no respiratory distress Abdomen: soft non-distended,  Extremities: knee wrapped Skin: no rashes Neuro: nonfocal  CBC:    BMET Recent Labs    07/15/19 0500  NA 140  K 4.6  CL 104  CO2 28  GLUCOSE 137*  BUN 14  CREATININE 0.60  CALCIUM 9.5     Liver Panel  No results for input(s): PROT, ALBUMIN, AST, ALT, ALKPHOS, BILITOT, BILIDIR, IBILI in the last 72 hours.     Sedimentation Rate No results for input(s): ESRSEDRATE in the last 72 hours. C-Reactive Protein No results for input(s): CRP in the last 72 hours.  Micro Results:  Recent Results (from the past 720 hour(s))  SARS Coronavirus 2 by RT PCR (hospital order, performed in [facility] hospital lab) Nasopharyngeal Nasopharyngeal Swab     Status: None   Collection Time: 07/11/19 10:46 AM   Specimen: Nasopharyngeal Swab  Result Value Ref Range Status   SARS Coronavirus 2 NEGATIVE NEGATIVE Final    Comment: (NOTE) SARS-CoV-2 target nucleic acids are NOT DETECTED.  The SARS-CoV-2 RNA is generally detectable in upper and lower respiratory specimens during the acute phase of infection. The lowest concentration of SARS-CoV-2 viral copies this assay can detect is 250 copies / mL. A negative result does not preclude SARS-CoV-2 infection and should not be used as the sole basis  for treatment or other patient management decisions.  A negative result may occur with improper specimen collection / handling, submission of specimen other than nasopharyngeal swab, presence of viral mutation(s) within the areas targeted by this assay, and inadequate number of viral copies (<250 copies / mL). A negative result must be combined with clinical observations, patient history, and epidemiological information.  Fact Sheet for Patients:   StrictlyIdeas.no  Fact Sheet for Healthcare Providers: BankingDealers.co.za  This test is not yet approved or  cleared by the [place] FDA and has been authorized for detection and/or diagnosis of SARS-CoV-2 by FDA under an Emergency Use Authorization (EUA).  This EUA will remain in effect (meaning this test can be used) for the duration of the COVID-19 declaration under Section 564(b)(1) of the Act, 21 U.S.C. section 360bbb-3(b)(1), unless the authorization is terminated or revoked sooner.  Performed at [facility], [place] [address] [place], [place] [address]   Urine culture     Status: None   Collection Time: 07/11/19 11:15 AM   Specimen: Urine, Clean Catch  Result Value Ref Range Status   Specimen Description   Final    URINE, CLEAN CATCH Performed at [facility], [place] [address] [place], [place] [address]    Special Requests   Final    NONE Performed at [facility], [place] [address] [place], [place] [address]    Culture   Final    NO GROWTH Performed at [facility], [place] [address], [place], [place] [address]    Report Status 07/12/2019 FINAL  Final  Culture, blood (routine x 2)     Status: None (Preliminary result)   Collection Time: 07/11/19 11:19 AM   Specimen: BLOOD LEFT ARM  Result Value Ref Range Status   Specimen Description   Final    BLOOD LEFT ARM Performed at [place] [address], [place], [place] [address]    Special Requests   Final    BOTTLES DRAWN AEROBIC AND ANAEROBIC Blood Culture adequate volume Performed at [place] [address], [place], [place] [address]    Culture  Setup Time PENDING  Incomplete   Culture   Final    NO GROWTH 4 DAYS Performed at [facility], [place] [address], [place],  [address]    Report Status PENDING  Incomplete  MRSA PCR Screening     Status: None   Collection Time: 07/11/19 11:22 AM   Specimen: Nasal Mucosa; Nasopharyngeal  Result Value Ref Range Status   MRSA by PCR NEGATIVE NEGATIVE Final    Comment:        The GeneXpert MRSA Assay (FDA approved for NASAL specimens only), is one component of a comprehensive MRSA colonization  surveillance program. It is not intended to diagnose MRSA infection nor to guide or monitor treatment for MRSA infections. Performed at [facility], [place] [address], [place], [place] [address]   Culture, blood (routine x 2)     Status: None (Preliminary result)   Collection Time: 07/11/19 11:23 AM   Specimen: BLOOD LEFT ARM  Result Value Ref Range Status   Specimen Description   Final    BLOOD LEFT ARM Performed at [place] [address], [place], [place] [address]    Special Requests   Final    BOTTLES DRAWN AEROBIC AND ANAEROBIC Blood Culture adequate volume Performed at [place] [address], [place], [place] [address]    Culture   Final    NO GROWTH 4 DAYS Performed at [facility], [place] [address] [place], [place] [address]    Report Status PENDING  Incomplete    Studies/Results: No results found.    Assessment/Plan:  INTERVAL HISTORY: sp I and D and exchange arthroplasty Strep mitis growing from cultures   Principal Problem:   Septic arthritis of knee, left (HCC) Active Problems:   Infection of total right knee replacement (HCC)    Meredith Rose is a 69 y.o. 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

## 2019-07-15 NOTE — Progress Notes (Signed)
Physical Therapy Treatment Patient Details Name: Meredith Rose MRN: [id number] DOB: 01/20/1951 Today's Date: 07/15/2019    History of Present Illness Patient is 69 y.o. female s/p Lt TKR (I&D with tibial poly exchange)  on 07/14/19 with PMH significant for ovarian cancer, HTN, OA, anxiety, Rt TKA in 2013, Lt TKA in 2019.    PT Comments    POD # 1 pm session Assisted to bathroom to void (150cc) then amb in hallway an increased distance.  Assisted back to bed and performed a few TE's followed by ICE.  Pt plans to D/C to home with IV antibiotics.    Follow Up Recommendations  Follow surgeon's recommendation for DC plan and follow-up therapies     Equipment Recommendations  None recommended by PT    Recommendations for Other Services       Precautions / Restrictions Precautions Precautions: Fall Restrictions Other Position/Activity Restrictions: WBAT    Mobility  Bed Mobility Overal bed mobility: Needs Assistance Bed Mobility: Sit to Supine       Sit to supine: Min assist   General bed mobility comments: assisted back to bed  Transfers Overall transfer level: Needs assistance Equipment used: Rolling walker (2 wheeled) Transfers: Sit to/from Stand Sit to Stand: Min guard;Independent         General transfer comment: 25% VC's on safety with turns  Ambulation/Gait Ambulation/Gait assistance: Supervision;Min guard Gait Distance (Feet): 57 Feet Assistive device: Rolling walker (2 wheeled) Gait Pattern/deviations: Step-to pattern;Decreased stride length;Decreased weight shift to left Gait velocity: decreased   General Gait Details: 25% VC's on safety with turns and increased time   Stairs             Wheelchair Mobility    Modified Rankin (Stroke Patients Only)       Balance                                            Cognition Arousal/Alertness: Awake/alert Behavior During Therapy: WFL for tasks assessed/performed Overall  Cognitive Status: Within Functional Limits for tasks assessed                                        Exercises  10 reps AP, 5 reps HS and SLR     General Comments        Pertinent Vitals/Pain Pain Assessment: 0-10 Pain Score: 6  Pain Location: Lt knee Pain Descriptors / Indicators: Aching;Discomfort Pain Intervention(s): Monitored during session;Premedicated before session;Repositioned;Ice applied    Home Living                      Prior Function            PT Goals (current goals can now be found in the care plan section) Progress towards PT goals: Progressing toward goals    Frequency    7X/week      PT Plan Current plan remains appropriate    Co-evaluation              AM-PAC PT "6 Clicks" Mobility   Outcome Measure  Help needed turning from your back to your side while in a flat bed without using bedrails?: A Little Help needed moving from lying on your back to sitting on the side of a flat  bed without using bedrails?: A Little Help needed moving to and from a bed to a chair (including a wheelchair)?: A Little Help needed standing up from a chair using your arms (e.g., wheelchair or bedside chair)?: A Little Help needed to walk in hospital room?: A Little Help needed climbing 3-5 steps with a railing? : A Little 6 Click Score: 18    End of Session Equipment Utilized During Treatment: Gait belt   Patient left: in bed Nurse Communication: Mobility status PT Visit Diagnosis: Muscle weakness (generalized) (M62.81);Difficulty in walking, not elsewhere classified (R26.2)     Time: 1342-1410 PT Time Calculation (min) (ACUTE ONLY): 28 min  Charges:  $Gait Training: 8-22 mins $Therapeutic Exercise: 8-22 mins                     Rica Koyanagi  PTA Acute  Rehabilitation Services Pager      [phone] Office      [phone]

## 2019-07-16 ENCOUNTER — Inpatient Hospital Stay: Payer: Self-pay

## 2019-07-16 LAB — CBC
HCT: 31.6 % — ABNORMAL LOW (ref 36.0–46.0)
Hemoglobin: 10.1 g/dL — ABNORMAL LOW (ref 12.0–15.0)
MCH: 27.7 pg (ref 26.0–34.0)
MCHC: 32 g/dL (ref 30.0–36.0)
MCV: 86.6 fL (ref 80.0–100.0)
Platelets: 390 10*3/uL (ref 150–400)
RBC: 3.65 MIL/uL — ABNORMAL LOW (ref 3.87–5.11)
RDW: 14 % (ref 11.5–15.5)
WBC: 17.2 10*3/uL — ABNORMAL HIGH (ref 4.0–10.5)
nRBC: 0 % (ref 0.0–0.2)

## 2019-07-16 LAB — CULTURE, BLOOD (ROUTINE X 2)
Culture: NO GROWTH
Special Requests: ADEQUATE

## 2019-07-16 MED ORDER — SODIUM CHLORIDE 0.9% FLUSH: 10.0000 mL | INTRAVENOUS | Status: DC | PRN

## 2019-07-16 MED ORDER — PENICILLIN G POTASSIUM 20000000 UNITS IJ SOLR: 4.0000 10*6.[IU] | INTRAVENOUS | Status: DC

## 2019-07-16 MED ORDER — PENICILLIN G POTASSIUM IV (FOR PTA / DISCHARGE USE ONLY): 24.0000 10*6.[IU] | INTRAVENOUS | 0 refills | Status: AC

## 2019-07-16 MED ORDER — CHLORHEXIDINE GLUCONATE CLOTH 2 % EX PADS: 6.0000 | MEDICATED_PAD | Freq: Every day | CUTANEOUS | Status: DC

## 2019-07-16 NOTE — Progress Notes (Signed)
PATIENT ID: Meredith Rose  MRN: [id number]  DOB/AGE:  10/30/1950 / 69 y.o.  2 Days Post-Op Procedure(s) (LRB): LEFT KNEE RADICAL IRRIGATION AND DEBRIDEMENT WITH POLY REVISION LEFT KNEE REPLACEMENT (Left)    PROGRESS NOTE Subjective: Patient is alert, oriented, no Nausea, no Vomiting, yes passing gas. Taking PO well. Denies SOB, Chest or Calf Pain. Using Incentive Spirometer, PAS in place. Ambulate WBAT with pt walking 57 ft with therapy, Patient reports pain as mild .    Objective: Vital signs in last 24 hours: Vitals:   07/15/19 1342 07/15/19 2117 07/16/19 0225 07/16/19 0510  BP: (!) 101/50 110/68 (!) 105/36 (!) 113/57  Pulse: (!) 55 (!) 53 (!) 52 (!) 52  Resp: 16 16 15 16   Temp: 98 F (36.7 C) (!) 97.5 F (36.4 C) (!) 97.5 F (36.4 C) 97.8 F (36.6 C)  TempSrc:  Oral Oral Oral  SpO2: 92% 93% (!) 88% (!) 88%  Weight:      Height:          Intake/Output from previous day: I/O last 3 completed shifts: In: 2641.3 [P.O.:840; I.V.:1701.3; IV Piggyback:100] Out: 2580 [Urine:2400; Drains:180]   Intake/Output this shift: Total I/O In: 240 [P.O.:240] Out: 0    LABORATORY DATA: Recent Labs    07/15/19 0500 07/16/19 0524  WBC 16.7* 17.2*  HGB 11.0* 10.1*  HCT 34.2* 31.6*  PLT 352 390  NA 140  --   K 4.6  --   CL 104  --   CO2 28  --   BUN 14  --   CREATININE 0.60  --   GLUCOSE 137*  --   CALCIUM 9.5  --     Examination: Neurologically intact Neurovascular intact Sensation intact distally Intact pulses distally Dorsiflexion/Plantar flexion intact Incision: dressing C/D/I No cellulitis present Compartment soft} Drains pulled with out difficulty.  4x4's placed over portals.  Will have nursing staff place aquacell over portals.  Assessment:   2 Days Post-Op Procedure(s) (LRB): LEFT KNEE RADICAL IRRIGATION AND DEBRIDEMENT WITH POLY REVISION LEFT KNEE REPLACEMENT (Left) ADDITIONAL DIAGNOSIS: Expected Acute Blood Loss Anemia, Hypertension Anticipated LOS equal  to or greater than 2 midnights due to - Age 69 and older with one or more of the following:  - Obesity  - Expected need for hospital services (PT, OT, Nursing) required for safe  discharge  - Anticipated need for postoperative skilled nursing care or inpatient rehab  - Active co-morbidities: Chronic pain requiring opiods and Active PJI     Plan: PT/OT WBAT, AROM and PROM  DVT Prophylaxis:  SCDx72hrs, ASA 81 mg BID x 2 weeks DISCHARGE PLAN: Home DISCHARGE NEEDS: Walker, 3-in-1 comode seat and IV Antibiotics with Rocephin x 6 weeks.     [name] 07/16/2019, 9:49 AM

## 2019-07-16 NOTE — Discharge Summary (Signed)
Patient ID: Meredith Rose MRN: [id number] DOB/AGE: 69-19-1952 69 y.o.  Admit date: 07/11/2019 Discharge date: 07/16/2019  Admission Diagnoses:  Principal Problem:   Septic arthritis of knee, left ([place]) Active Problems:   Infection of total right knee replacement [facility])   Discharge Diagnoses:  Same  Past Medical History:  Diagnosis Date  . Anxiety   . Arthritis    OA  . Congestion of throat    SINUSES, NONPRODUCTIVE COUGH  . Hypertension    takes meds daily  . Hypothyroidism    takes meds daily  . Ovarian cancer ([place]) 2002  . Ruptured appendicitis 06/03/11  . 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 female who was admitted 07/11/2019 for operative treatment ofSeptic arthritis of knee, left ([place]). Patient has severe unremitting pain that affects sleep, daily activities, and work/hobbies. After pre-op clearance the patient was taken to the operating room on 07/14/2019 and underwent  Procedure(s): LEFT KNEE RADICAL IRRIGATION AND DEBRIDEMENT WITH POLY REVISION LEFT KNEE REPLACEMENT.    Patient was given perioperative antibiotics:  Anti-infectives (From admission, onward)   Start     Dose/Rate Route Frequency Ordered Stop   07/17/19 0800  penicillin G potassium 4 Million Units in dextrose 5 % 250 mL IVPB     Discontinue     4 Million Units 250 mL/hr over 60 Minutes Intravenous Every 4 hours 07/16/19 1515     07/17/19 0000  penicillin G IVPB     Discontinue     24 Million Units Intravenous Continuous 07/16/19 1605 08/26/19 2359   07/14/19 0900  ceFAZolin (ANCEF) IVPB 2g/100 mL premix        2 g 200 mL/hr over 30 Minutes Intravenous On call to O.R. 07/14/19 0850 07/14/19 1304   07/12/19 1000  vancomycin (VANCOREADY)  IVPB 750 mg/150 mL  Status:  Discontinued        750 mg 150 mL/hr over 60 Minutes Intravenous Every 12 hours 07/11/19 1556 07/13/19 0832   07/11/19 1600  cefTRIAXone (ROCEPHIN) 2 g in sodium chloride 0.9 % 100 mL IVPB  Status:  Discontinued        2 g 200 mL/hr over 30 Minutes Intravenous Daily 07/11/19 1454 07/16/19 1515   07/11/19 1515  vancomycin (VANCOREADY) IVPB 2000 mg/400 mL        2,000 mg 200 mL/hr over 120 Minutes Intravenous  Once 07/11/19 1514 07/11/19 1818       Patient was given sequential compression devices, early ambulation, and chemoprophylaxis to prevent DVT.  Patient benefited maximally from hospital stay and there were no complications.    Recent vital signs:  Patient Vitals for the past 24 hrs:  BP Temp Temp src Pulse Resp SpO2  07/16/19 1430 124/66 98 F (36.7 C) Oral (!) 49 -- 94 %  07/16/19 0510 (!) 113/57 97.8 F (36.6 C) Oral (!) 52 16 (!) 88 %  07/16/19 0225 (!) 105/36 (!) 97.5 F (36.4 C) Oral (!) 52 15 (!) 88 %  07/15/19 2117 110/68 (!) 97.5 F (36.4 C) Oral (!) 53 16 93 %     Recent laboratory studies:  Recent Labs    07/15/19 0500 07/16/19 0524  WBC 16.7* 17.2*  HGB 11.0* 10.1*  HCT 34.2* 31.6*  PLT 352  390  NA 140  --   K 4.6  --   CL 104  --   CO2 28  --   BUN 14  --   CREATININE 0.60  --   GLUCOSE 137*  --   CALCIUM 9.5  --      Discharge Medications:   Allergies as of 07/16/2019      Reactions   Tape Itching   BANDAID OR PAPER TAPE IF LEFT ON TOO LONG       Medication List    STOP taking these medications   aspirin 81 MG tablet Replaced by: aspirin EC 81 MG tablet   baclofen 10 MG tablet Commonly known as: LIORESAL   HYDROcodone-acetaminophen 5-325 MG tablet Commonly known as: NORCO/VICODIN     TAKE these medications   amLODipine 5 MG tablet Commonly known as: NORVASC Take 5 mg by mouth daily.   aspirin EC 81 MG tablet Take 1 tablet (81 mg total) by mouth 2 (two) times daily. Replaces: aspirin 81 MG  tablet   hydrochlorothiazide 25 MG tablet Commonly known as: HYDRODIURIL Take 25 mg by mouth daily.   Klor-Con M20 20 MEQ tablet Generic drug: potassium chloride SA Take 20 mEq by mouth 2 (two) times daily.   levothyroxine 100 MCG tablet Commonly known as: SYNTHROID Take 100 mcg by mouth daily before breakfast.   oxyCODONE-acetaminophen 5-325 MG tablet Commonly known as: PERCOCET/ROXICET Take 1 tablet by mouth every 4 (four) hours as needed for severe pain.   penicillin G  IVPB Inject 24 Million Units into the vein continuous. Infuse PCN G 24 million units daily over 24 hours as continuous infusion Indication: Left knee prosthetic joint infection First Dose: Yes Last Day of Therapy:  7/27/20201 Labs - Once weekly:  CBC/D and BMP, Labs - Every other week:  ESR and CRP Method of administration: Elastomeric (Continuous infusion) Method of administration may be changed at the discretion of home infusion pharmacist based upon assessment of the patient and/or caregiver's ability to self-administer the medication ordered. Start taking on: July 17, 2019   propranolol 20 MG tablet Commonly known as: INDERAL Take 20 mg by mouth daily.   sertraline 100 MG tablet Commonly known as: ZOLOFT Take 100 mg by mouth at bedtime.   simvastatin 20 MG tablet Commonly known as: ZOCOR Take 20 mg by mouth at bedtime.   tiZANidine 2 MG tablet Commonly known as: ZANAFLEX Take 1 tablet (2 mg total) by mouth every 6 (six) hours as needed.            Durable Medical Equipment  (From admission, onward)         Start     Ordered   07/14/19 1649  DME Walker rolling  Once       Question:  Patient needs a walker to treat with the following condition  Answer:  Status post total left knee replacement   07/14/19 1649   07/14/19 1649  DME 3 n 1  Once        07/14/19 1649           Discharge Care Instructions  (From admission, onward)         Start     Ordered   07/16/19 0000  Change  dressing on IV access line weekly and PRN  (Home infusion instructions - [facility] )        07/16/19 1605   07/16/19 0000  Weight bearing as tolerated  07/16/19 1605          Diagnostic Studies: DG Chest 2 View  Result Date: 07/11/2019 CLINICAL DATA:  Septic arthritis of left knee. EXAM: CHEST - 2 VIEW COMPARISON:  11/08/2017 FINDINGS: The cardiomediastinal contours are normal. Chronic bronchial and interstitial thickening, unchanged from prior exam. Subsegmental atelectasis at the left lung base. Mild biapical pleuroparenchymal scarring. Pulmonary vasculature is normal. No consolidation, pleural effusion, or pneumothorax. No acute osseous abnormalities are seen. IMPRESSION: 1. Chronic bronchial and interstitial thickening. 2. Subsegmental atelectasis at the left lung base. Electronically Signed   By: Keith Rake M.D.   On: 07/11/2019 16:58   [place] EKG SITE RITE  Result Date: 07/16/2019 If Site Rite image not attached, placement could not be confirmed due to current cardiac rhythm.   Disposition: Discharge disposition: 01-Home or Self Care       Discharge Instructions    [facility] pharmacist to adjust dose for Vancomycin, Aminoglycosides and other anti-infective therapies as requested by physician.   Complete by: As directed    [facility] to provide Cath Flo 21m   Complete by: As directed    Administer for PICC line occlusion and as ordered by physician for other access device issues.   Anaphylaxis Kit: Provided to treat any anaphylactic reaction to the medication being provided to the patient if First Dose or when requested by physician   Complete by: As directed    Epinephrine 139mml vial / amp: Administer 0.6m44m0.6ml58mubcutaneously once for moderate to severe anaphylaxis, nurse to call physician and pharmacy when reaction occurs and call 911 if needed for immediate care   Diphenhydramine 50mg39mIV vial: Administer 25-50mg 57mM PRN  for first dose reaction, rash, itching, mild reaction, nurse to call physician and pharmacy when reaction occurs   Sodium Chloride 0.9% NS 500ml I61mdminister if needed for hypovolemic blood pressure drop or as ordered by physician after call to physician with anaphylactic reaction   Call MD / Call 911   Complete by: As directed    If you experience chest pain or shortness of breath, CALL 911 and be transported to the hospital emergency room.  If you develope a fever above 101 F, pus (white drainage) or increased drainage or redness at the wound, or calf pain, call your surgeon's office.   Change dressing on IV access line weekly and PRN   Complete by: As directed    Constipation Prevention   Complete by: As directed    Drink plenty of fluids.  Prune juice may be helpful.  You may use a stool softener, such as Colace (over the counter) 100 mg twice a day.  Use MiraLax (over the counter) for constipation as needed.   Diet - low sodium heart healthy   Complete by: As directed    Driving restrictions   Complete by: As directed    No driving for 2 weeks   Flush IV access with Sodium Chloride 0.9% and Heparin 10 units/ml or 100 units/ml   Complete by: As directed    Home infusion instructions - [facility]   Complete by: As directed    Instructions: Flush IV access with Sodium Chloride 0.9% and Heparin 10units/ml or 100units/ml   Change dressing on IV access line: Weekly and PRN   Instructions Cath Flo 2mg: Ad39mister for PICC Line occlusion and as ordered by physician for other access device   [facility] pharmacist to adjust dose for: Vancomycin, Aminoglycosides and other anti-infective  therapies as requested by physician   Increase activity slowly as tolerated   Complete by: As directed    Method of administration may be changed at the discretion of home infusion pharmacist based upon assessment of the patient and/or caregiver's ability to self-administer the medication  ordered   Complete by: As directed    Patient may shower   Complete by: As directed    You may shower without a dressing once there is no drainage.  Do not wash over the wound.  If drainage remains, cover wound with plastic wrap and then shower.   Weight bearing as tolerated   Complete by: As directed        Follow-up Information    Frederik Pear, MD In 2 weeks.   Specialty: Orthopedic Surgery Contact information: [place] [phone]                Signed: [name] 07/16/2019, 4:06 PM

## 2019-07-16 NOTE — Progress Notes (Addendum)
Subjective: No new complaints, ;patient feeling well   Antibiotics:  Anti-infectives (From admission, onward)   Start     Dose/Rate Route Frequency Ordered Stop   07/14/19 0900  ceFAZolin (ANCEF) IVPB 2g/100 mL premix        2 g 200 mL/hr over 30 Minutes Intravenous On call to O.R. 07/14/19 0850 07/14/19 1304   07/12/19 1000  vancomycin (VANCOREADY) IVPB 750 mg/150 mL  Status:  Discontinued        750 mg 150 mL/hr over 60 Minutes Intravenous Every 12 hours 07/11/19 1556 07/13/19 0832   07/11/19 1600  cefTRIAXone (ROCEPHIN) 2 g in sodium chloride 0.9 % 100 mL IVPB     Discontinue     2 g 200 mL/hr over 30 Minutes Intravenous Daily 07/11/19 1454     07/11/19 1515  vancomycin (VANCOREADY) IVPB 2000 mg/400 mL        2,000 mg 200 mL/hr over 120 Minutes Intravenous  Once 07/11/19 1514 07/11/19 1818      Medications: Scheduled Meds: . amLODipine  5 mg Oral Daily  . aspirin  81 mg Oral BID  . docusate sodium  100 mg Oral BID  . enoxaparin (LOVENOX) injection  40 mg Subcutaneous Q24H  . gabapentin  100 mg Oral TID  . hydrochlorothiazide  25 mg Oral Daily  . levothyroxine  100 mcg Oral QAC breakfast  . potassium chloride SA  20 mEq Oral BID  . propranolol  20 mg Oral Daily  . sertraline  100 mg Oral QHS  . simvastatin  20 mg Oral QHS   Continuous Infusions: . cefTRIAXone (ROCEPHIN)  IV 2 g (07/16/19 1051)  . dextrose 5 % and 0.45 % NaCl with KCl 20 mEq/L 30 mL/hr at 07/15/19 0634  . methocarbamol (ROBAXIN) IV     PRN Meds:.acetaminophen, alum & mag hydroxide-simeth, bisacodyl, diphenhydrAMINE, hydrocortisone cream, HYDROmorphone (DILAUDID) injection, menthol-cetylpyridinium **OR** phenol, methocarbamol **OR** methocarbamol (ROBAXIN) IV, metoCLOPramide **OR** metoCLOPramide (REGLAN) injection, ondansetron **OR** ondansetron (ZOFRAN) IV, oxyCODONE, polyethylene glycol, sodium phosphate    Objective: Weight change:   Intake/Output Summary (Last 24 hours) at 07/16/2019  1327 Last data filed at 07/16/2019 0911 Gross per 24 hour  Intake 1278.05 ml  Output 970 ml  Net 308.05 ml   Blood pressure (!) 113/57, pulse (!) 52, temperature 97.8 F (36.6 C), temperature source Oral, resp. rate 16, height 5\' 5"  (1.651 m), weight 109.8 kg, SpO2 (!) 88 %. Temp:  [97.5 F (36.4 C)-98 F (36.7 C)] 97.8 F (36.6 C) (06/16 0510) Pulse Rate:  [52-55] 52 (06/16 0510) Resp:  [15-16] 16 (06/16 0510) BP: (101-113)/(36-68) 113/57 (06/16 0510) SpO2:  [88 %-93 %] 88 % (06/16 0510)  Physical Exam: General: Alert and awake, oriented x3, not in any acute distress. HEENT: anicteric sclera, EOMI CVS regular rate, normal  Chest: , no wheezing, no respiratory distress Abdomen: soft non-distended,  Extremities: knee wrapped Skin: no rashes Neuro: nonfocal  CBC:    BMET Recent Labs    07/15/19 0500  NA 140  K 4.6  CL 104  CO2 28  GLUCOSE 137*  BUN 14  CREATININE 0.60  CALCIUM 9.5     Liver Panel  No results for input(s): PROT, ALBUMIN, AST, ALT, ALKPHOS, BILITOT, BILIDIR, IBILI in the last 72 hours.     Sedimentation Rate No results for input(s): ESRSEDRATE in the last 72 hours. C-Reactive Protein No results for input(s): CRP in the last 72 hours.  Micro Results: Recent  Results (from the past 720 hour(s))  SARS Coronavirus 2 by RT PCR (hospital order, performed in [facility] hospital lab) Nasopharyngeal Nasopharyngeal Swab     Status: None   Collection Time: 07/11/19 10:46 AM   Specimen: Nasopharyngeal Swab  Result Value Ref Range Status   SARS Coronavirus 2 NEGATIVE NEGATIVE Final    Comment: (NOTE) SARS-CoV-2 target nucleic acids are NOT DETECTED.  The SARS-CoV-2 RNA is generally detectable in upper and lower respiratory specimens during the acute phase of infection. The lowest concentration of SARS-CoV-2 viral copies this assay can detect is 250 copies / mL. A negative result does not preclude SARS-CoV-2 infection and should not be used as  the sole basis for treatment or other patient management decisions.  A negative result may occur with improper specimen collection / handling, submission of specimen other than nasopharyngeal swab, presence of viral mutation(s) within the areas targeted by this assay, and inadequate number of viral copies (<250 copies / mL). A negative result must be combined with clinical observations, patient history, and epidemiological information.  Fact Sheet for Patients:   StrictlyIdeas.no  Fact Sheet for Healthcare Providers: BankingDealers.co.za  This test is not yet approved or  cleared by the [place] FDA and has been authorized for detection and/or diagnosis of SARS-CoV-2 by FDA under an Emergency Use Authorization (EUA).  This EUA will remain in effect (meaning this test can be used) for the duration of the COVID-19 declaration under Section 564(b)(1) of the Act, 21 U.S.C. section 360bbb-3(b)(1), unless the authorization is terminated or revoked sooner.  Performed at [facility], [place] [address] [place], [place] [address]   Urine culture     Status: None   Collection Time: 07/11/19 11:15 AM   Specimen: Urine, Clean Catch  Result Value Ref Range Status   Specimen Description   Final    URINE, CLEAN CATCH Performed at [facility], [place] [address], [place], [place] [address]    Special Requests   Final    NONE Performed at [facility], [place] [address], [place], [place] [address]    Culture   Final    NO GROWTH Performed at [facility], [place] [address] [place], [place] [address]    Report Status 07/12/2019 FINAL  Final  Culture, blood (routine x 2)     Status: None (Preliminary result)   Collection Time: 07/11/19 11:19 AM   Specimen: BLOOD LEFT ARM  Result Value Ref Range Status   Specimen Description   Final    BLOOD LEFT ARM Performed at [place] [address] [place], [place] [address]    Special Requests   Final    BOTTLES DRAWN AEROBIC AND ANAEROBIC Blood Culture adequate volume Performed at [place] [address], [place], [place] [address]    Culture  Setup Time PENDING  Incomplete   Culture   Final    NO GROWTH 5 DAYS Performed at [facility], [place] [address] [place], [place] [address]    Report Status PENDING  Incomplete  MRSA PCR Screening     Status: None   Collection Time: 07/11/19 11:22 AM   Specimen: Nasal Mucosa; Nasopharyngeal  Result Value Ref Range Status   MRSA by PCR NEGATIVE NEGATIVE Final    Comment:        The GeneXpert MRSA Assay (FDA approved for NASAL specimens only), is one component of a comprehensive MRSA colonization surveillance  program. It is not intended to diagnose MRSA infection nor to guide or monitor treatment for MRSA infections. Performed at [facility], [place] [address] [place], [place] [address]   Culture, blood (routine x 2)     Status: None   Collection Time: 07/11/19 11:23 AM   Specimen: BLOOD LEFT ARM  Result Value Ref Range Status   Specimen Description   Final    BLOOD LEFT ARM Performed at [place] [address] [place], [place] [address]    Special Requests   Final    BOTTLES DRAWN AEROBIC AND ANAEROBIC Blood Culture adequate volume Performed at [place] [address], [place], [place] [address]    Culture   Final    NO GROWTH 5 DAYS Performed at [facility], [place] [address], [place], [place] [address]    Report Status 07/16/2019 FINAL  Final    Studies/Results: [place] EKG SITE RITE  Result Date: 07/16/2019 If Site Rite image not attached, placement could not be confirmed due to current cardiac rhythm.     Assessment/Plan:  INTERVAL HISTORY: still need SENSIS on Strep intermedius   Principal Problem:   Septic arthritis of knee, left  (HCC) Active Problems:   Infection of total right knee replacement (HCC)    Meredith Rose is a 69 y.o. 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   Meredith Rose has an VIDEO appointment on 07/30/2019 at 1 with Dr. Tommy Medal  We will call her the day before and day of appt  The [facility] is located in the [facility] at  [address] in [place].  [address], which is located to the left of the elevators.  Phone: [phone]  Fax: [phone]  [email or web address]    LOS: 5 days   Alcide Evener 07/16/2019, 1:27 PM

## 2019-07-16 NOTE — Progress Notes (Signed)
Peripherally Inserted Central Catheter Placement  The IV Nurse has discussed with the patient and/or persons authorized to consent for the patient, the purpose of this procedure and the potential benefits and risks involved with this procedure.  The benefits include less needle sticks, lab draws from the catheter, and the patient may be discharged home with the catheter. Risks include, but not limited to, infection, bleeding, blood clot (thrombus formation), and puncture of an artery; nerve damage and irregular heartbeat and possibility to perform a PICC exchange if needed/ordered by physician.  Alternatives to this procedure were also discussed.  Bard Power PICC patient education guide, fact sheet on infection prevention and patient information card has been provided to patient /or left at bedside.    PICC Placement Documentation  PICC Single Lumen 88/50/27 PICC Right Basilic 38 cm 1 cm (Active)  Indication for Insertion or Continuance of Line Home intravenous therapies (PICC only) 07/16/19 1430  Exposed Catheter (cm) 1 cm 07/16/19 1430  Site Assessment Clean;Dry;Intact 07/16/19 1430  Line Status Blood return noted;Flushed;Saline locked 07/16/19 1430  Dressing Type Transparent;Securing device 07/16/19 1430  Dressing Status Clean;Dry;Intact;Antimicrobial disc in place 07/16/19 1430  Dressing Change Due 07/23/19 07/16/19 1430       Meredith Rose 07/16/2019, 2:34 PM

## 2019-07-16 NOTE — Progress Notes (Signed)
PHARMACY CONSULT NOTE FOR:  OUTPATIENT  PARENTERAL ANTIBIOTIC THERAPY (OPAT)  Indication: Left knee prosthetic joint infection Regimen: PCN G 24 million units daily over 24 hours as continuous infusion End date: 08/26/2019  Confirmed with ortho - S. Mitis PCN MIC = 0.06  IV antibiotic discharge orders are pended. To discharging provider:  please sign these orders via discharge navigator,  Select New Orders & click on the button choice - Manage This Unsigned Work.     Thank you for allowing pharmacy to be a part of this patient's care.  Doreene Eland, PharmD, BCPS.   Work Cell: [phone] 07/16/2019 3:22 PM

## 2019-07-16 NOTE — Progress Notes (Signed)
Discharge instructions discussed with patient and family, verbalized agreement and understanding 

## 2019-07-16 NOTE — Progress Notes (Signed)
Physical Therapy Treatment Patient Details Name: Meredith Rose MRN: [id number] DOB: 20-Sep-1950 Today's Date: 07/16/2019    History of Present Illness Patient is 69 y.o. female s/p Lt TKR (I&D with tibial poly exchange)  on 07/14/19 with PMH significant for ovarian cancer, HTN, OA, anxiety, Rt TKA in 2013, Lt TKA in 2019.    PT Comments    POD # 2 am session. Assisted OOB to bathroom.  General bed mobility comments: using her strap to self assist LE and increased time.  General transfer comment: 25% VC's on safety with turns.  Also assisted with a toilet transfer. General Gait Details: 25% VC's on safety with turns and increased time.  Tolerated an increased distance. Then returned to room to perform some TE's following HEP handout.  Instructed on proper tech, freq as well as use of ICE.     Follow Up Recommendations  Follow surgeon's recommendation for DC plan and follow-up therapies     Equipment Recommendations  None recommended by PT    Recommendations for Other Services       Precautions / Restrictions Precautions Precautions: Fall Precaution Comments: instructed no pillow under knee Restrictions Weight Bearing Restrictions: No Other Position/Activity Restrictions: WBAT    Mobility  Bed Mobility Overal bed mobility: Needs Assistance Bed Mobility: Supine to Sit     Supine to sit: Supervision;Min guard     General bed mobility comments: using her strap to self assist LE and increased time  Transfers Overall transfer level: Needs assistance Equipment used: Rolling walker (2 wheeled) Transfers: Sit to/from Stand Sit to Stand: Supervision;Min guard         General transfer comment: 25% VC's on safety with turns.  Also assisted with a toilet transfer.  Ambulation/Gait Ambulation/Gait assistance: Supervision;Min guard Gait Distance (Feet): 68 Feet Assistive device: Rolling walker (2 wheeled) Gait Pattern/deviations: Step-to pattern;Decreased stride  length;Decreased weight shift to left Gait velocity: decreased   General Gait Details: 25% VC's on safety with turns and increased time.  Tolerated an increased distance.   Stairs             Wheelchair Mobility    Modified Rankin (Stroke Patients Only)       Balance                                            Cognition Arousal/Alertness: Awake/alert Behavior During Therapy: WFL for tasks assessed/performed Overall Cognitive Status: Within Functional Limits for tasks assessed                                 General Comments: AxO x 3 pleasant.  Teaches CNA class at [facility]      Exercises   Total Knee Revision/Replacement TE's following HEP handout 10 reps B LE ankle pumps 05 reps towel squeezes 05 reps knee presses 05 reps heel slides  05 reps SAQ's 05 reps SLR's 05 reps ABD Educated on use of gait belt to assist with TE's Followed by ICE     General Comments        Pertinent Vitals/Pain Pain Score: 4  Pain Location: Lt knee Pain Descriptors / Indicators: Aching;Discomfort;Tender;Tightness Pain Intervention(s): Monitored during session;Premedicated before session;Repositioned;Ice applied    Home Living  Prior Function            PT Goals (current goals can now be found in the care plan section) Progress towards PT goals: Progressing toward goals    Frequency    7X/week      PT Plan Current plan remains appropriate    Co-evaluation              AM-PAC PT "6 Clicks" Mobility   Outcome Measure  Help needed turning from your back to your side while in a flat bed without using bedrails?: None Help needed moving from lying on your back to sitting on the side of a flat bed without using bedrails?: A Little Help needed moving to and from a bed to a chair (including a wheelchair)?: A Little Help needed standing up from a chair using your arms (e.g., wheelchair or bedside chair)?: A  Little Help needed to walk in hospital room?: A Little Help needed climbing 3-5 steps with a railing? : A Little 6 Click Score: 19    End of Session Equipment Utilized During Treatment: Gait belt Activity Tolerance: Patient tolerated treatment well Patient left: in chair;with call bell/phone within reach   PT Visit Diagnosis: Muscle weakness (generalized) (M62.81);Difficulty in walking, not elsewhere classified (R26.2)     Time: 4451-4604 PT Time Calculation (min) (ACUTE ONLY): 29 min  Charges:  $Gait Training: 8-22 mins $Therapeutic Exercise: 8-22 mins                     {   PTA Acute  Rehabilitation Services Pager      [phone]  Office      [phone]

## 2019-07-16 NOTE — Progress Notes (Signed)
Physical Therapy Treatment Patient Details Name: Meredith Rose MRN: [id number] DOB: [birth year] Today's Date: 07/16/2019    History of Present Illness Patient is 70 y.o. female s/p Lt TKR (I&D with tibial poly exchange)  on 07/14/19 with PMH significant for ovarian cancer, HTN, OA, anxiety, Rt TKA in 2013, Lt TKA in 2019.    PT Comments    POD # 2 pm session Assisted to bathroom then amb in hallway. General Gait Details: 25% VC's on safety with turns and increased time.  Tolerated an increased distance.  Pt progressing well.  Pt hopes to D/C to home later today.    Follow Up Recommendations  Follow surgeon's recommendation for DC plan and follow-up therapies     Equipment Recommendations  None recommended by PT    Recommendations for Other Services       Precautions / Restrictions Precautions Precautions: Fall Precaution Comments: instructed no pillow under knee Restrictions Weight Bearing Restrictions: No Other Position/Activity Restrictions: WBAT    Mobility  Bed Mobility Overal bed mobility: Needs Assistance Bed Mobility: Supine to Sit     Supine to sit: Supervision;Min guard     General bed mobility comments: assisted back to bed for PICC placement  Transfers Overall transfer level: Needs assistance Equipment used: Rolling walker (2 wheeled) Transfers: Sit to/from Stand Sit to Stand: Supervision;Min guard         General transfer comment: 25% VC's on safety with turns.  Also assisted with a toilet transfer.  Ambulation/Gait Ambulation/Gait assistance: Supervision;Min guard Gait Distance (Feet): 88 Feet Assistive device: Rolling walker (2 wheeled) Gait Pattern/deviations: Step-to pattern;Decreased stride length;Decreased weight shift to left Gait velocity: decreased   General Gait Details: 25% VC's on safety with turns and increased time.  Tolerated an increased distance.   Stairs             Wheelchair Mobility    Modified Rankin (Stroke  Patients Only)       Balance                                            Cognition Arousal/Alertness: Awake/alert Behavior During Therapy: WFL for tasks assessed/performed Overall Cognitive Status: Within Functional Limits for tasks assessed                                 General Comments: AxO x 3 pleasant.  Teaches CNA class at [facility]      Exercises      General Comments        Pertinent Vitals/Pain Pain Score: 4  Pain Location: Lt knee Pain Descriptors / Indicators: Aching;Discomfort;Tender;Tightness Pain Intervention(s): Monitored during session;Premedicated before session;Repositioned;Ice applied    Home Living                      Prior Function            PT Goals (current goals can now be found in the care plan section) Progress towards PT goals: Progressing toward goals    Frequency    7X/week      PT Plan Current plan remains appropriate    Co-evaluation              AM-PAC PT "6 Clicks" Mobility   Outcome Measure  Help needed turning from your back to your side  while in a flat bed without using bedrails?: None Help needed moving from lying on your back to sitting on the side of a flat bed without using bedrails?: A Little Help needed moving to and from a bed to a chair (including a wheelchair)?: A Little Help needed standing up from a chair using your arms (e.g., wheelchair or bedside chair)?: A Little Help needed to walk in hospital room?: A Little Help needed climbing 3-5 steps with a railing? : A Little 6 Click Score: 19    End of Session Equipment Utilized During Treatment: Gait belt Activity Tolerance: Patient tolerated treatment well Patient left: in bed   PT Visit Diagnosis: Muscle weakness (generalized) (M62.81);Difficulty in walking, not elsewhere classified (R26.2)     Time: 1959-7471 PT Time Calculation (min) (ACUTE ONLY): 25 min  Charges:  $Gait Training: 8-22  mins $Therapeutic Activity: 8-22 mins                     Rica Koyanagi  PTA Acute  Rehabilitation Services Pager      [phone] Office      [phone]

## 2019-07-18 ENCOUNTER — Ambulatory Visit: Admit: 2019-07-18 | Payer: Medicare Other | Admitting: Orthopedic Surgery

## 2019-07-18 SURGERY — TOTAL KNEE ARTHROPLASTY WITH REVISION COMPONENTS
Anesthesia: Choice | Site: Knee | Laterality: Left

## 2019-07-19 LAB — CULTURE, BLOOD (ROUTINE X 2)
Culture: NO GROWTH
Special Requests: ADEQUATE

## 2019-07-21 ENCOUNTER — Telehealth: Payer: Self-pay

## 2019-07-21 NOTE — Telephone Encounter (Signed)
Received voicemail from [place], [place] with [facility] stating she needs orders to provide care through 6/27.Attempted to call RN back, but number is not in service. Yates Decamp with [facility] regarding message. Jackelyn Poling will reach out to nursing to confirm orders. [place]

## 2019-07-28 ENCOUNTER — Telehealth: Payer: Self-pay

## 2019-07-28 NOTE — Telephone Encounter (Signed)
Received call from [place] with [facility] reporting WBC results 07/17/19 WBC = 15.4 07/26/19 WBC=15 Routing to MD to make aware. Eugenia Mcalpine

## 2019-07-28 NOTE — Telephone Encounter (Signed)
Ok ,very good

## 2019-07-30 ENCOUNTER — Encounter: Payer: Self-pay | Admitting: Infectious Disease

## 2019-07-30 ENCOUNTER — Other Ambulatory Visit: Payer: Self-pay

## 2019-07-30 ENCOUNTER — Telehealth (INDEPENDENT_AMBULATORY_CARE_PROVIDER_SITE_OTHER): Payer: Medicare Other | Admitting: Infectious Disease

## 2019-07-30 DIAGNOSIS — M00262 Other streptococcal arthritis, left knee: Secondary | ICD-10-CM

## 2019-07-30 DIAGNOSIS — M79604 Pain in right leg: Secondary | ICD-10-CM

## 2019-07-30 DIAGNOSIS — T8453XD Infection and inflammatory reaction due to internal right knee prosthesis, subsequent encounter: Secondary | ICD-10-CM

## 2019-07-30 HISTORY — DX: Pain in right leg: M79.604

## 2019-07-30 MED ORDER — AMOXICILLIN 500 MG PO CAPS: 500.0000 mg | ORAL_CAPSULE | Freq: Three times a day (TID) | ORAL | 11 refills | Status: DC

## 2019-07-30 NOTE — Progress Notes (Signed)
Verbal orders given to [facility] with [facility] and verbal orders given to extend patient';s antibiotics and pull PICC line on 08/26/19 per Dr. Tommy Medal. [place] verbalized understanding and read orders back.  T Brooks Sailors

## 2019-07-30 NOTE — Progress Notes (Signed)
Patient ID: Meredith Rose, female   DOB: Aug 07, 1950, 69 y.o.   MRN: [id number]  Virtual Visit via Video Note  I connected with Meredith Rose on 07/30/19 at 10:30 AM EDT by a video enabled telemedicine application and verified that I am speaking with the correct person using two identifiers.  Location: Patient: Home Provider: [facility]   I discussed the limitations of evaluation and management by telemedicine and the availability of in person appointments. The patient expressed understanding and agreed to proceed.  History of Present Illness:    69 y.o. 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  We had her on ceftriaxone initially then changed over to continuous penicillin infusion which she has gone home on.  Her inflammatory markers have trended down.  Her white count was still a bit at 15,000 but this is down from her last white count while in the hospital which was 17,000.  Her pain is diminished although she does have some pain laterally in her lower leg her incision is clean and dry and intact.  She has follow-up with [name] fairly soon.  She does not have fever chills or other systemic symptoms.  She showed me the PICC line over the phone it looked clean and dry and intact.  She also showed me the penicillin that she is having infused continuously  Past Medical History:  Diagnosis Date   Anxiety    Arthritis    OA   Congestion of throat    SINUSES, NONPRODUCTIVE COUGH   Hypertension    takes meds daily   Hypothyroidism    takes meds daily   Ovarian cancer ([place]) 2002   Ruptured appendicitis 06/03/11   Squamous cell carcinoma 10/2009   right shin    Past Surgical History:  Procedure Laterality Date   ABDOMINAL HYSTERECTOMY  2002   "radical; for ovarian cancer"   ABDOMINAL WOUND DEHISCENCE  2002-2003   "twice"   APPENDECTOMY  06/05/2011   Procedure: APPENDECTOMY;  Surgeon: Gwenyth Ober, MD;  Location: [place];  Service: General;;    CARPAL TUNNEL RELEASE Right AFTER 2013   I & D KNEE WITH POLY EXCHANGE Left 07/14/2019   Procedure: LEFT KNEE RADICAL IRRIGATION AND DEBRIDEMENT WITH POLY REVISION LEFT KNEE REPLACEMENT;  Surgeon: Frederik Pear, MD;  Location: [facility];  Service: Orthopedics;  Laterality: Left;   KNEE ARTHROSCOPY  ~ 2007   left   KNEE ARTHROSCOPY  12/2010   right   SQUAMOUS CELL CARCINOMA EXCISION  10/2009   right shin   TOTAL KNEE ARTHROPLASTY  09/22/2011   Procedure: TOTAL KNEE ARTHROPLASTY;  Surgeon: Yvette Rack., MD;  Location: [place];  Service: Orthopedics;  Laterality: Right;   TOTAL KNEE ARTHROPLASTY Left 11/16/2017   Procedure: LEFT TOTAL KNEE ARTHROPLASTY;  Surgeon: Earlie Server, MD;  Location: [facility];  Service: Orthopedics;  Laterality: Left;    No family history on file.    Social History   Socioeconomic History   Marital status: Married    Spouse name: Not on file   Number of children: Not on file   Years of education: Not on file   Highest education level: Not on file  Occupational History   Not on file  Tobacco Use   Smoking status: Never Smoker   Smokeless tobacco: Never Used  Vaping Use   Vaping Use: Never used  Substance and Sexual Activity   Alcohol use: No    Comment: 06/20/11 "have drank  once or twice in my life"   Drug use: No   Sexual activity: Yes  Other Topics Concern   Not on file  Social History Narrative   Not on file   Social Determinants of Health   Financial Resource Strain:    Difficulty of Paying Living Expenses:   Food Insecurity:    Worried About Charity fundraiser in the Last Year:    Arboriculturist in the Last Year:   Transportation Needs:    Film/video editor (Medical):    Lack of Transportation (Non-Medical):   Physical Activity:    Days of Exercise per Week:    Minutes of Exercise per Session:   Stress:    Feeling of Stress :   Social Connections:    Frequency of Communication with Friends and  Family:    Frequency of Social Gatherings with Friends and Family:    Attends Religious Services:    Active Member of Clubs or Organizations:    Attends Music therapist:    Marital Status:     Allergies  Allergen Reactions   Tape Itching    BANDAID OR PAPER TAPE IF LEFT ON TOO LONG      Current Outpatient Medications:    amLODipine (NORVASC) 5 MG tablet, Take 5 mg by mouth daily., Disp: , Rfl:    amoxicillin (AMOXIL) 500 MG capsule, Take 1 capsule (500 mg total) by mouth 3 (three) times daily., Disp: 90 capsule, Rfl: 11   aspirin EC 81 MG tablet, Take 1 tablet (81 mg total) by mouth 2 (two) times daily., Disp: 60 tablet, Rfl: 0   hydrochlorothiazide (HYDRODIURIL) 25 MG tablet, Take 25 mg by mouth daily., Disp: , Rfl:    KLOR-CON M20 20 MEQ tablet, Take 20 mEq by mouth 2 (two) times daily., Disp: , Rfl: 3   levothyroxine (SYNTHROID, LEVOTHROID) 100 MCG tablet, Take 100 mcg by mouth daily before breakfast. , Disp: , Rfl:    oxyCODONE-acetaminophen (PERCOCET/ROXICET) 5-325 MG tablet, Take 1 tablet by mouth every 4 (four) hours as needed for severe pain., Disp: 30 tablet, Rfl: 0   penicillin G IVPB, Inject 24 Million Units into the vein continuous. Infuse PCN G 24 million units daily over 24 hours as continuous infusion Indication: Left knee prosthetic joint infection First Dose: Yes Last Day of Therapy:  7/27/20201 Labs - Once weekly:  CBC/D and BMP, Labs - Every other week:  ESR and CRP Method of administration: Elastomeric (Continuous infusion) Method of administration may be changed at the discretion of home infusion pharmacist based upon assessment of the patient and/or caregiver's ability to self-administer the medication ordered., Disp: 41 Units, Rfl: 0   propranolol (INDERAL) 20 MG tablet, Take 20 mg by mouth daily., Disp: , Rfl:    sertraline (ZOLOFT) 100 MG tablet, Take 100 mg by mouth at bedtime. , Disp: , Rfl:    simvastatin (ZOCOR) 20 MG tablet, Take  20 mg by mouth at bedtime. 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  Continue IV penicillin and when finishing up IV penicillin PICC line can be discontinued and will start  on amoxicillin 500 mg 3 times daily.  She will continue this for protracted course likely least a year.  I have made a follow-up appointment for her to see me then.  Leukocytosis: Expect much of this is postoperative leukemoid reaction though it has persisted now more than 10 days after discharge.  We will follow up with next week's labs.  Sed rate was trending down which encourages me in her overall picture is encouraging with diminished pain  Pain in lower extremity: Does not sound like a DVT by description sounds more like a muscular strain.      Follow Up Instructions:    I discussed the assessment and treatment plan with the patient. The patient was provided an opportunity to ask questions and all were answered. The patient agreed with the plan and demonstrated an understanding of the instructions.   The patient was advised to call back or seek an in-person evaluation if the symptoms worsen or if the condition fails to improve as anticipated.   Alcide Evener, MD

## 2019-08-05 ENCOUNTER — Encounter: Payer: Self-pay | Admitting: Infectious Disease

## 2019-08-11 ENCOUNTER — Encounter: Payer: Self-pay | Admitting: Infectious Disease

## 2019-09-02 ENCOUNTER — Other Ambulatory Visit: Payer: Self-pay

## 2019-09-02 ENCOUNTER — Other Ambulatory Visit: Payer: Medicare Other

## 2019-09-02 DIAGNOSIS — Z20822 Contact with and (suspected) exposure to covid-19: Secondary | ICD-10-CM

## 2019-09-03 LAB — NOVEL CORONAVIRUS, NAA: SARS-CoV-2, NAA: NOT DETECTED

## 2019-09-03 LAB — SARS-COV-2, NAA 2 DAY TAT

## 2019-09-16 ENCOUNTER — Ambulatory Visit (INDEPENDENT_AMBULATORY_CARE_PROVIDER_SITE_OTHER): Payer: Medicare Other | Admitting: Infectious Disease

## 2019-09-16 ENCOUNTER — Encounter: Payer: Self-pay | Admitting: Infectious Disease

## 2019-09-16 ENCOUNTER — Other Ambulatory Visit: Payer: Self-pay

## 2019-09-16 VITALS — BP 140/83 | HR 61 | Temp 97.8°F | Wt 242.0 lb

## 2019-09-16 DIAGNOSIS — T8453XD Infection and inflammatory reaction due to internal right knee prosthesis, subsequent encounter: Secondary | ICD-10-CM

## 2019-09-16 DIAGNOSIS — T8453XA Infection and inflammatory reaction due to internal right knee prosthesis, initial encounter: Secondary | ICD-10-CM

## 2019-09-16 DIAGNOSIS — M00262 Other streptococcal arthritis, left knee: Secondary | ICD-10-CM | POA: Diagnosis not present

## 2019-09-16 DIAGNOSIS — I10 Essential (primary) hypertension: Secondary | ICD-10-CM

## 2019-09-16 DIAGNOSIS — F419 Anxiety disorder, unspecified: Secondary | ICD-10-CM

## 2019-09-16 DIAGNOSIS — K3532 Acute appendicitis with perforation and localized peritonitis, without abscess: Secondary | ICD-10-CM

## 2019-09-16 NOTE — Progress Notes (Signed)
Subjective:  Chief complaint follow-up for prosthetic joint infection    Patient ID: Meredith Rose, female    DOB: 01/05/1951, 69 y.o.   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  We had her on ceftriaxone initially then changed over to continuous penicillin infusion which she has completed and then switched to amoxicillin.  Her pain is dramatically improved.  Inflammatory markers also trended down.  She was concerned that her white count was elevated when we had checked it with home health labs.  She is seeing [name] is going to see him again in 3 months time.  Wonders if her infection came from the cleaning of her mouth that occurred roughly a month prior to her developing symptoms of infection.  Past Medical History:  Diagnosis Date  . Anxiety   . Arthritis    OA  . Congestion of throat    SINUSES, NONPRODUCTIVE COUGH  . Hypertension    takes meds daily  . Hypothyroidism    takes meds daily  . Leg pain, right 07/30/2019  . Ovarian cancer ([place]) 2002  . Ruptured appendicitis 06/03/11  . 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 ABDOMINAL HYSTERECTOMY  2002   "radical; for ovarian cancer"  . ABDOMINAL WOUND DEHISCENCE  2002-2003   "twice"  . APPENDECTOMY  06/05/2011   Procedure: APPENDECTOMY;  Surgeon: Gwenyth Ober, MD;  Location: [place];  Service: General;;  . CARPAL TUNNEL RELEASE Right AFTER 2013  . I & D KNEE WITH POLY EXCHANGE Left 07/14/2019   Procedure: LEFT KNEE RADICAL IRRIGATION AND DEBRIDEMENT WITH POLY REVISION LEFT KNEE REPLACEMENT;  Surgeon: Frederik Pear, MD;  Location: [facility];  Service: Orthopedics;  Laterality: Left;  . KNEE ARTHROSCOPY  ~ 2007   left  . KNEE ARTHROSCOPY  12/2010   right  . SQUAMOUS CELL CARCINOMA EXCISION  10/2009   right shin  . TOTAL KNEE ARTHROPLASTY  09/22/2011   Procedure: TOTAL KNEE ARTHROPLASTY;  Surgeon: Yvette Rack., MD;  Location: [place];  Service: Orthopedics;  Laterality: Right;  . TOTAL KNEE ARTHROPLASTY Left 11/16/2017   Procedure: LEFT TOTAL KNEE ARTHROPLASTY;  Surgeon: Earlie Server, MD;  Location: [facility];  Service: Orthopedics;  Laterality: Left;    No family history on file.    Social History   Socioeconomic History  . Marital status: Married    Spouse name: Not on file  . Number of children: Not on file  . Years of education: Not on file  . Highest education level: Not on file  Occupational History  . Not on file  Tobacco Use  . Smoking status: Never Smoker  . Smokeless tobacco: Never Used  Vaping Use  . Vaping Use: Never used  Substance and Sexual Activity  . Alcohol use: No    Comment: 06/20/11 "have drank once or twice in my life"  . Drug use: No  . Sexual activity: Yes  Other Topics Concern  . Not on file  Social History Narrative  . Not on file   Social Determinants of Health   Financial Resource Strain:   . Difficulty of Paying Living Expenses:   Food Insecurity:   . Worried About Charity fundraiser in the Last Year:   . [place] in the Last Year:   Transportation Needs:   . Lack of  Transportation (Medical):   Marland Kitchen Lack of Transportation (Non-Medical):   Physical Activity:   . Days of Exercise per Week:   . Minutes of Exercise per Session:   Stress:   . Feeling of Stress :   Social Connections:   . Frequency of Communication with Friends and Family:   . Frequency of Social Gatherings with Friends and Family:   . Attends Religious Services:   . Active Member of Clubs or Organizations:   . Attends Archivist Meetings:   Marland Kitchen Marital Status:     Allergies  Allergen Reactions  . Tape Itching    BANDAID OR PAPER TAPE IF LEFT ON TOO LONG      Current Outpatient Medications:  .  amLODipine (NORVASC) 5 MG tablet, Take 5 mg by mouth daily., Disp: , Rfl:  .  amoxicillin (AMOXIL) 500 MG capsule, Take 1 capsule (500 mg total) by mouth  3 (three) times daily., Disp: 90 capsule, Rfl: 11 .  aspirin EC 81 MG tablet, Take 1 tablet (81 mg total) by mouth 2 (two) times daily., Disp: 60 tablet, Rfl: 0 .  hydrochlorothiazide (HYDRODIURIL) 25 MG tablet, Take 25 mg by mouth daily., Disp: , Rfl:  .  KLOR-CON M20 20 MEQ tablet, Take 20 mEq by mouth 2 (two) times daily., Disp: , Rfl: 3 .  levothyroxine (SYNTHROID, LEVOTHROID) 100 MCG tablet, Take 100 mcg by mouth daily before breakfast. , Disp: , Rfl:  .  propranolol (INDERAL) 20 MG tablet, Take 20 mg by mouth daily., Disp: , Rfl:  .  sertraline (ZOLOFT) 100 MG tablet, Take 100 mg by mouth at bedtime. , Disp: , Rfl:  .  simvastatin (ZOCOR) 20 MG tablet, Take 20 mg by mouth at bedtime. , Disp: , Rfl:  .  VALTREX 1 g tablet, Take 2,000 mg by mouth 2 (two) times daily., Disp: , Rfl:  .  oxyCODONE-acetaminophen (PERCOCET/ROXICET) 5-325 MG tablet, Take 1 tablet by mouth every 4 (four) hours as needed for severe pain. (Patient not taking: Reported on 09/16/2019), Disp: 30 tablet, Rfl: 0 .  tiZANidine (ZANAFLEX) 2 MG tablet, Take 1 tablet (2 mg total) by mouth every 6 (six) hours as needed. (Patient not taking: Reported on 09/16/2019), Disp: 60 tablet, Rfl: 0   Review of Systems  Constitutional: Negative for activity change, appetite change, chills, diaphoresis, fatigue, fever and unexpected weight change.  HENT: Negative for congestion, rhinorrhea, sinus pressure, sneezing, sore throat and trouble swallowing.   Eyes: Negative for photophobia and visual disturbance.  Respiratory: Negative for cough, chest tightness, shortness of breath, wheezing and stridor.   Cardiovascular: Negative for chest pain, palpitations and leg swelling.  Gastrointestinal: Negative for abdominal distention, abdominal pain, anal bleeding, blood in stool, constipation, diarrhea, nausea and vomiting.  Genitourinary: Negative for difficulty urinating, dysuria, flank pain and hematuria.  Musculoskeletal: Negative for  arthralgias, back pain, gait problem, joint swelling and myalgias.  Skin: Negative for color change, pallor, rash and wound.  Neurological: Negative for dizziness, tremors, weakness and light-headedness.  Hematological: Negative for adenopathy. Does not bruise/bleed easily.  Psychiatric/Behavioral: Negative for agitation, behavioral problems, confusion, decreased concentration, dysphoric mood and sleep disturbance.       Objective:   Physical Exam Constitutional:      General: She is not in acute distress.    Appearance: Normal appearance. She is well-developed. She is not ill-appearing or diaphoretic.  HENT:     Head: Normocephalic and atraumatic.     Right Ear: Hearing and external ear  normal.     Left Ear: Hearing and external ear normal.     Nose: No nasal deformity or rhinorrhea.  Eyes:     General: No scleral icterus.    Conjunctiva/sclera: Conjunctivae normal.     Right eye: Right conjunctiva is not injected.     Left eye: Left conjunctiva is not injected.     Pupils: Pupils are equal, round, and reactive to light.  Neck:     Vascular: No JVD.  Cardiovascular:     Rate and Rhythm: Normal rate and regular rhythm.     Heart sounds: Normal heart sounds, S1 normal and S2 normal. No murmur heard.  No friction rub.  Abdominal:     General: Bowel sounds are normal. There is no distension.     Palpations: Abdomen is soft.     Tenderness: There is no abdominal tenderness.  Musculoskeletal:        General: Normal range of motion.     Right shoulder: Normal.     Left shoulder: Normal.     Cervical back: Normal range of motion and neck supple.     Right hip: Normal.     Left hip: Normal.     Right knee: Normal.     Left knee: Normal.  Lymphadenopathy:     Head:     Right side of head: No submandibular, preauricular or posterior auricular adenopathy.     Left side of head: No submandibular, preauricular or posterior auricular adenopathy.     Cervical: No cervical adenopathy.      Right cervical: No superficial or deep cervical adenopathy.    Left cervical: No superficial or deep cervical adenopathy.  Skin:    General: Skin is warm and dry.     Coloration: Skin is not pale.     Findings: No abrasion, bruising, ecchymosis, erythema, lesion or rash.     Nails: There is no clubbing.  Neurological:     Mental Status: She is alert and oriented to person, place, and time.     Sensory: No sensory deficit.     Coordination: Coordination normal.     Gait: Gait normal.  Psychiatric:        Attention and Perception: Attention normal. She is attentive.        Mood and Affect: Mood is anxious.        Speech: Speech normal.        Behavior: Behavior normal. Behavior is cooperative.        Thought Content: Thought content normal.        Judgment: Judgment normal.     Prosthetic joint site September 16 2019:          Assessment & Plan:  PJI: Check inflammatory markers CBC and BMP continue amoxicillin and follow-up in 4 months time.

## 2019-09-17 LAB — CBC WITH DIFFERENTIAL/PLATELET
Absolute Monocytes: 421 cells/uL (ref 200–950)
Basophils Absolute: 62 cells/uL (ref 0–200)
Basophils Relative: 0.9 %
Eosinophils Absolute: 462 cells/uL (ref 15–500)
Eosinophils Relative: 6.7 %
HCT: 39.2 % (ref 35.0–45.0)
Hemoglobin: 12.8 g/dL (ref 11.7–15.5)
Lymphs Abs: 1925 cells/uL (ref 850–3900)
MCH: 27.3 pg (ref 27.0–33.0)
MCHC: 32.7 g/dL (ref 32.0–36.0)
MCV: 83.6 fL (ref 80.0–100.0)
MPV: 9 fL (ref 7.5–12.5)
Monocytes Relative: 6.1 %
Neutro Abs: 4030 cells/uL (ref 1500–7800)
Neutrophils Relative %: 58.4 %
Platelets: 275 10*3/uL (ref 140–400)
RBC: 4.69 10*6/uL (ref 3.80–5.10)
RDW: 15.1 % — ABNORMAL HIGH (ref 11.0–15.0)
Total Lymphocyte: 27.9 %
WBC: 6.9 10*3/uL (ref 3.8–10.8)

## 2019-09-17 LAB — BASIC METABOLIC PANEL WITH GFR
BUN: 12 mg/dL (ref 7–25)
CO2: 30 mmol/L (ref 20–32)
Calcium: 10.1 mg/dL (ref 8.6–10.4)
Chloride: 106 mmol/L (ref 98–110)
Creat: 0.72 mg/dL (ref 0.50–0.99)
GFR, Est African American: 99 mL/min/{1.73_m2} (ref >=60)
GFR, Est Non African American: 85 mL/min/{1.73_m2} (ref >=60)
Glucose, Bld: 115 mg/dL — ABNORMAL HIGH (ref 65–99)
Potassium: 3.5 mmol/L (ref 3.5–5.3)
Sodium: 145 mmol/L (ref 135–146)

## 2019-09-17 LAB — C-REACTIVE PROTEIN: CRP: 7.2 mg/L (ref <8.0)

## 2019-09-17 LAB — SEDIMENTATION RATE: Sed Rate: 9 mm/h (ref 0–30)

## 2020-01-19 ENCOUNTER — Ambulatory Visit (INDEPENDENT_AMBULATORY_CARE_PROVIDER_SITE_OTHER): Payer: Medicare Other | Admitting: Infectious Disease

## 2020-01-19 ENCOUNTER — Encounter: Payer: Self-pay | Admitting: Infectious Disease

## 2020-01-19 ENCOUNTER — Other Ambulatory Visit: Payer: Self-pay

## 2020-01-19 VITALS — BP 169/88 | HR 60 | Temp 97.8°F | Wt 248.0 lb

## 2020-01-19 DIAGNOSIS — T8453XD Infection and inflammatory reaction due to internal right knee prosthesis, subsequent encounter: Secondary | ICD-10-CM

## 2020-01-19 DIAGNOSIS — T8459XD Infection and inflammatory reaction due to other internal joint prosthesis, subsequent encounter: Secondary | ICD-10-CM

## 2020-01-19 DIAGNOSIS — T8454XD Infection and inflammatory reaction due to internal left knee prosthesis, subsequent encounter: Secondary | ICD-10-CM

## 2020-01-19 DIAGNOSIS — M00262 Other streptococcal arthritis, left knee: Secondary | ICD-10-CM

## 2020-01-19 DIAGNOSIS — Z96652 Presence of left artificial knee joint: Secondary | ICD-10-CM

## 2020-01-19 NOTE — Progress Notes (Signed)
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  We had her on ceftriaxone initially then changed over to continuous penicillin infusion which she has completed and then switched to amoxicillin.  Her pain is dramatically improved.  Inflammatory markers also trended down.  She was concerned that her white count was elevated when we had checked it with home health labs.  She saw [name] again is planning on seeing him in March   He tells me that she continues to feel better and that she hardly feels any knee in her pain whatsoever.  She is a bit uncomfortable the idea of coming off antibiotics at this point in time.  Past Medical History:  Diagnosis Date  . Anxiety   . Arthritis    OA  . Congestion of throat    SINUSES, NONPRODUCTIVE COUGH  . Hypertension    takes meds daily  . Hypothyroidism    takes meds daily  . Leg pain, right 07/30/2019  . Ovarian cancer ([place]) 2002  . Ruptured appendicitis 06/03/11  . 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 ABDOMINAL HYSTERECTOMY  2002   "radical; for ovarian cancer"  . ABDOMINAL WOUND DEHISCENCE  2002-2003   "twice"  . APPENDECTOMY  06/05/2011   Procedure: APPENDECTOMY;  Surgeon: Gwenyth Ober, MD;  Location: [place];  Service: General;;  . CARPAL TUNNEL RELEASE Right AFTER 2013  . I & D KNEE WITH POLY EXCHANGE Left 07/14/2019   Procedure: LEFT KNEE RADICAL IRRIGATION AND DEBRIDEMENT WITH POLY REVISION LEFT KNEE REPLACEMENT;  Surgeon: Frederik Pear, MD;  Location: [facility];  Service: Orthopedics;  Laterality: Left;  . KNEE ARTHROSCOPY  ~ 2007   left  . KNEE ARTHROSCOPY  12/2010   right  . SQUAMOUS CELL CARCINOMA EXCISION  10/2009   right shin  . TOTAL KNEE ARTHROPLASTY  09/22/2011    Procedure: TOTAL KNEE ARTHROPLASTY;  Surgeon: Yvette Rack., MD;  Location: [place];  Service: Orthopedics;  Laterality: Right;  . TOTAL KNEE ARTHROPLASTY Left 11/16/2017   Procedure: LEFT TOTAL KNEE ARTHROPLASTY;  Surgeon: Earlie Server, MD;  Location: [facility];  Service: Orthopedics;  Laterality: Left;    No family history on file.    Social History   Socioeconomic History  . Marital status: Married    Spouse name: Not on file  . Number of children: Not on file  . Years of education: Not on file  . Highest education level: Not on file  Occupational History  . Not on file  Tobacco Use  . Smoking status: Never Smoker  . Smokeless tobacco: Never Used  Vaping Use  . Vaping Use: Never used  Substance and Sexual Activity  . Alcohol use: No    Comment: 06/20/11 "have drank once or twice in my life"  . Drug use: No  . Sexual activity: Yes  Other Topics Concern  . Not on file  Social History Narrative  . Not on file   Social Determinants of Health   Financial Resource Strain: Not on file  Food Insecurity: Not on file  Transportation Needs: Not on file  Physical Activity: Not on file  Stress: Not on file  Social Connections: Not on file  Allergies  Allergen Reactions  . Angiotensin Receptor Blockers Other (See Comments)  . Tape Itching    BANDAID OR PAPER TAPE IF LEFT ON TOO LONG      Current Outpatient Medications:  .  amLODipine (NORVASC) 5 MG tablet, Take 5 mg by mouth daily., Disp: , Rfl:  .  amoxicillin (AMOXIL) 500 MG capsule, Take 1 capsule (500 mg total) by mouth 3 (three) times daily., Disp: 90 capsule, Rfl: 11 .  aspirin EC 81 MG tablet, Take 1 tablet (81 mg total) by mouth 2 (two) times daily., Disp: 60 tablet, Rfl: 0 .  hydrochlorothiazide (HYDRODIURIL) 25 MG tablet, Take 25 mg by mouth daily., Disp: , Rfl:  .  KLOR-CON M20 20 MEQ tablet, Take 20 mEq by mouth 2 (two) times daily., Disp: , Rfl: 3 .  levothyroxine (SYNTHROID, LEVOTHROID) 100 MCG tablet,  Take 100 mcg by mouth daily before breakfast., Disp: , Rfl:  .  propranolol (INDERAL) 20 MG tablet, Take 20 mg by mouth daily., Disp: , Rfl:  .  sertraline (ZOLOFT) 100 MG tablet, Take 100 mg by mouth at bedtime. , Disp: , Rfl:  .  simvastatin (ZOCOR) 20 MG tablet, Take 20 mg by mouth at bedtime. , Disp: , Rfl:  .  VALTREX 1 g tablet, Take 2,000 mg by mouth 2 (two) times daily., Disp: , Rfl:  .  oxyCODONE-acetaminophen (PERCOCET/ROXICET) 5-325 MG tablet, Take 1 tablet by mouth every 4 (four) hours as needed for severe pain. (Patient not taking: Reported on 01/19/2020), Disp: 30 tablet, Rfl: 0 .  tiZANidine (ZANAFLEX) 2 MG tablet, Take 1 tablet (2 mg total) by mouth every 6 (six) hours as needed. (Patient not taking: Reported on 01/19/2020), Disp: 60 tablet, Rfl: 0   Review of Systems  Constitutional: Negative for activity change, appetite change, chills, diaphoresis, fatigue, fever and unexpected weight change.  HENT: Negative for congestion, rhinorrhea, sinus pressure, sneezing, sore throat and trouble swallowing.   Eyes: Negative for photophobia and visual disturbance.  Respiratory: Negative for cough, chest tightness, shortness of breath, wheezing and stridor.   Cardiovascular: Negative for chest pain, palpitations and leg swelling.  Gastrointestinal: Negative for abdominal distention, abdominal pain, anal bleeding, blood in stool, constipation, diarrhea, nausea and vomiting.  Genitourinary: Negative for difficulty urinating, dysuria, flank pain and hematuria.  Musculoskeletal: Negative for arthralgias, back pain, gait problem, joint swelling and myalgias.  Skin: Negative for color change, pallor, rash and wound.  Neurological: Negative for dizziness, tremors, weakness and light-headedness.  Hematological: Negative for adenopathy. Does not bruise/bleed easily.  Psychiatric/Behavioral: Negative for agitation, behavioral problems, confusion, decreased concentration, dysphoric mood and sleep  disturbance. The patient is not nervous/anxious.        Objective:   Physical Exam Constitutional:      General: She is not in acute distress.    Appearance: Normal appearance. She is well-developed. She is not ill-appearing or diaphoretic.  HENT:     Head: Normocephalic and atraumatic.     Right Ear: Hearing and external ear normal.     Left Ear: Hearing and external ear normal.     Nose: No nasal deformity or rhinorrhea.  Eyes:     General: No scleral icterus.    Conjunctiva/sclera: Conjunctivae normal.     Right eye: Right conjunctiva is not injected.     Left eye: Left conjunctiva is not injected.     Pupils: Pupils are equal, round, and reactive to light.  Neck:     Vascular: No JVD.  Cardiovascular:     Rate and Rhythm: Normal rate and regular rhythm.     Heart sounds: Normal heart sounds, S1 normal and S2 normal. No murmur heard. No friction rub.  Abdominal:     General: Bowel sounds are normal. There is no distension.     Palpations: Abdomen is soft.     Tenderness: There is no abdominal tenderness.  Musculoskeletal:        General: Normal range of motion.     Right shoulder: Normal.     Left shoulder: Normal.     Cervical back: Normal range of motion and neck supple.     Right hip: Normal.     Left hip: Normal.     Right knee: Normal.     Left knee: Normal.  Lymphadenopathy:     Head:     Right side of head: No submandibular, preauricular or posterior auricular adenopathy.     Left side of head: No submandibular, preauricular or posterior auricular adenopathy.     Cervical: No cervical adenopathy.     Right cervical: No superficial or deep cervical adenopathy.    Left cervical: No superficial or deep cervical adenopathy.  Skin:    General: Skin is warm and dry.     Coloration: Skin is not pale.     Findings: No abrasion, bruising, ecchymosis, erythema, lesion or rash.     Nails: There is no clubbing.  Neurological:     General: No focal deficit present.      Mental Status: She is alert and oriented to person, place, and time.     Sensory: No sensory deficit.     Coordination: Coordination normal.     Gait: Gait normal.  Psychiatric:        Attention and Perception: Attention normal. She is attentive.        Mood and Affect: Mood normal. Mood is not anxious.        Speech: Speech normal.        Behavior: Behavior normal. Behavior is cooperative.        Thought Content: Thought content normal.        Judgment: Judgment normal.     Prosthetic joint site September 16 2019:          Assessment & Plan:  PJI: Check inflammatory markers CBC and BMP  She has gotten to the 6 months time but is still a bit apprehensive about stopping antibiotics.  Therefore we will let her continue on the amoxicillin and plan on seeing her in March around the same time she is seeing Dr. Elesa Massed again.  Because of the type of organism that was isolated I do feel more confident that we can stop antibiotics soon.  At most I would treat her for a year

## 2020-01-20 LAB — BASIC METABOLIC PANEL WITH GFR
BUN: 13 mg/dL (ref 7–25)
CO2: 27 mmol/L (ref 20–32)
Calcium: 10 mg/dL (ref 8.6–10.4)
Chloride: 106 mmol/L (ref 98–110)
Creat: 0.73 mg/dL (ref 0.50–0.99)
GFR, Est African American: 97 mL/min/{1.73_m2} (ref >=60)
GFR, Est Non African American: 84 mL/min/{1.73_m2} (ref >=60)
Glucose, Bld: 107 mg/dL — ABNORMAL HIGH (ref 65–99)
Potassium: 4.3 mmol/L (ref 3.5–5.3)
Sodium: 141 mmol/L (ref 135–146)

## 2020-01-20 LAB — CBC WITH DIFFERENTIAL/PLATELET
Absolute Monocytes: 556 cells/uL (ref 200–950)
Basophils Absolute: 58 cells/uL (ref 0–200)
Basophils Relative: 0.7 %
Eosinophils Absolute: 357 cells/uL (ref 15–500)
Eosinophils Relative: 4.3 %
HCT: 40.4 % (ref 35.0–45.0)
Hemoglobin: 13.5 g/dL (ref 11.7–15.5)
Lymphs Abs: 1876 cells/uL (ref 850–3900)
MCH: 28 pg (ref 27.0–33.0)
MCHC: 33.4 g/dL (ref 32.0–36.0)
MCV: 83.8 fL (ref 80.0–100.0)
MPV: 9.2 fL (ref 7.5–12.5)
Monocytes Relative: 6.7 %
Neutro Abs: 5453 cells/uL (ref 1500–7800)
Neutrophils Relative %: 65.7 %
Platelets: 277 10*3/uL (ref 140–400)
RBC: 4.82 10*6/uL (ref 3.80–5.10)
RDW: 14.3 % (ref 11.0–15.0)
Total Lymphocyte: 22.6 %
WBC: 8.3 10*3/uL (ref 3.8–10.8)

## 2020-01-20 LAB — C-REACTIVE PROTEIN: CRP: 4.5 mg/L (ref <8.0)

## 2020-01-20 LAB — SEDIMENTATION RATE: Sed Rate: 9 mm/h (ref 0–30)

## 2020-04-21 ENCOUNTER — Ambulatory Visit (INDEPENDENT_AMBULATORY_CARE_PROVIDER_SITE_OTHER): Payer: Medicare Other | Admitting: Infectious Disease

## 2020-04-21 ENCOUNTER — Encounter: Payer: Self-pay | Admitting: Infectious Disease

## 2020-04-21 ENCOUNTER — Other Ambulatory Visit: Payer: Self-pay

## 2020-04-21 VITALS — BP 126/67 | HR 63 | Temp 97.8°F | Wt 250.0 lb

## 2020-04-21 DIAGNOSIS — I10 Essential (primary) hypertension: Secondary | ICD-10-CM | POA: Diagnosis not present

## 2020-04-21 DIAGNOSIS — M00262 Other streptococcal arthritis, left knee: Secondary | ICD-10-CM | POA: Diagnosis not present

## 2020-04-21 DIAGNOSIS — T8453XD Infection and inflammatory reaction due to internal right knee prosthesis, subsequent encounter: Secondary | ICD-10-CM

## 2020-04-21 NOTE — Progress Notes (Signed)
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  We had her on ceftriaxone initially then changed over to continuous penicillin infusion which she has completed and then switched to amoxicillin.  Her pain is dramatically improved.  Inflammatory markers also trended down.  She was concerned that her white count was elevated when we had checked it with home health labs.  She saw [name] is seeing her tomorrow.  When I last saw her she was beyond 70 months of therapy but not comfortable stopping antibiotics.  She is now 70 months out from her surgery and I think she is can stop her antibiotics.  We will check inflammatory markers and only if they are abnormal well I asked her to resume antibiotics.  .  Past Medical History:  Diagnosis Date  . Anxiety   . Arthritis    OA  . Congestion of throat    SINUSES, NONPRODUCTIVE COUGH  . Hypertension    takes meds daily  . Hypothyroidism    takes meds daily  . Leg pain, right 07/30/2019  . Ovarian cancer ([place]) 2002  . Ruptured appendicitis 06/03/11  . 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 ABDOMINAL HYSTERECTOMY  2002   "radical; for ovarian cancer"  . ABDOMINAL WOUND DEHISCENCE  2002-2003   "twice"  . APPENDECTOMY  06/05/2011   Procedure: APPENDECTOMY;  Surgeon: Gwenyth Ober, MD;  Location: [facility];  Service: General;;  . CARPAL TUNNEL RELEASE Right AFTER 2013  . I & D KNEE WITH POLY EXCHANGE Left 07/14/2019   Procedure: LEFT KNEE RADICAL IRRIGATION AND DEBRIDEMENT WITH POLY REVISION LEFT KNEE REPLACEMENT;  Surgeon: Frederik Pear, MD;  Location: [facility];  Service: Orthopedics;  Laterality: Left;  . KNEE ARTHROSCOPY  ~ 2007   left  . KNEE ARTHROSCOPY  12/2010   right  . SQUAMOUS CELL  CARCINOMA EXCISION  10/2009   right shin  . TOTAL KNEE ARTHROPLASTY  09/22/2011   Procedure: TOTAL KNEE ARTHROPLASTY;  Surgeon: Yvette Rack., MD;  Location: [facility];  Service: Orthopedics;  Laterality: Right;  . TOTAL KNEE ARTHROPLASTY Left 11/16/2017   Procedure: LEFT TOTAL KNEE ARTHROPLASTY;  Surgeon: Earlie Server, MD;  Location: [facility];  Service: Orthopedics;  Laterality: Left;    No family history on file.    Social History   Socioeconomic History  . Marital status: Married    Spouse name: Not on file  . Number of children: Not on file  . Years of education: Not on file  . Highest education level: Not on file  Occupational History  . Not on file  Tobacco Use  . Smoking status: Never Smoker  . Smokeless tobacco: Never Used  Vaping Use  . Vaping Use: Never used  Substance and Sexual Activity  . Alcohol use: No    Comment: 06/20/11 "have drank once or twice in my life"  . Drug use: No  . Sexual activity: Yes  Other Topics Concern  . Not on file  Social History Narrative  . Not on file   Social Determinants of Health   Financial Resource Strain: Not on file  Food Insecurity: Not on file  Transportation Needs: Not on file  Physical Activity:  Not on file  Stress: Not on file  Social Connections: Not on file    Allergies  Allergen Reactions  . Angiotensin Receptor Blockers Other (See Comments)  . Tape Itching    BANDAID OR PAPER TAPE IF LEFT ON TOO LONG      Current Outpatient Medications:  .  amLODipine (NORVASC) 5 MG tablet, Take 5 mg by mouth daily., Disp: , Rfl:  .  amoxicillin (AMOXIL) 500 MG capsule, Take 1 capsule (500 mg total) by mouth 3 (three) times daily., Disp: 90 capsule, Rfl: 11 .  aspirin EC 81 MG tablet, Take 1 tablet (81 mg total) by mouth 2 (two) times daily., Disp: 60 tablet, Rfl: 0 .  hydrochlorothiazide (HYDRODIURIL) 25 MG tablet, Take 25 mg by mouth daily., Disp: , Rfl:  .  KLOR-CON M20 20 MEQ tablet, Take 20 mEq by mouth 2 (two) times  daily., Disp: , Rfl: 3 .  levothyroxine (SYNTHROID, LEVOTHROID) 100 MCG tablet, Take 100 mcg by mouth daily before breakfast., Disp: , Rfl:  .  oxyCODONE-acetaminophen (PERCOCET/ROXICET) 5-325 MG tablet, Take 1 tablet by mouth every 4 (four) hours as needed for severe pain. (Patient not taking: Reported on 01/19/2020), Disp: 30 tablet, Rfl: 0 .  propranolol (INDERAL) 20 MG tablet, Take 20 mg by mouth daily., Disp: , Rfl:  .  sertraline (ZOLOFT) 100 MG tablet, Take 100 mg by mouth at bedtime. , Disp: , Rfl:  .  simvastatin (ZOCOR) 20 MG tablet, Take 20 mg by mouth at bedtime. , Disp: , Rfl:  .  tiZANidine (ZANAFLEX) 2 MG tablet, Take 1 tablet (2 mg total) by mouth every 6 (six) hours as needed. (Patient not taking: Reported on 01/19/2020), Disp: 60 tablet, Rfl: 0 .  VALTREX 1 g tablet, Take 2,000 mg by mouth 2 (two) times daily., Disp: , Rfl:    Review of Systems  Constitutional: Negative for activity change, appetite change, chills, diaphoresis, fatigue, fever and unexpected weight change.  HENT: Negative for congestion, rhinorrhea, sinus pressure, sneezing, sore throat and trouble swallowing.   Eyes: Negative for photophobia and visual disturbance.  Respiratory: Negative for cough, chest tightness, shortness of breath, wheezing and stridor.   Cardiovascular: Negative for chest pain, palpitations and leg swelling.  Gastrointestinal: Negative for abdominal distention, abdominal pain, anal bleeding, blood in stool, constipation, diarrhea, nausea and vomiting.  Genitourinary: Negative for difficulty urinating, dysuria, flank pain and hematuria.  Musculoskeletal: Negative for arthralgias, back pain, gait problem, joint swelling and myalgias.  Skin: Negative for color change, pallor, rash and wound.  Neurological: Negative for dizziness, tremors, weakness and light-headedness.  Hematological: Negative for adenopathy. Does not bruise/bleed easily.  Psychiatric/Behavioral: Negative for agitation,  behavioral problems, confusion, decreased concentration and dysphoric mood. The patient is not nervous/anxious.        Objective:   Physical Exam Constitutional:      General: She is not in acute distress.    Appearance: Normal appearance. She is well-developed. She is not ill-appearing or diaphoretic.  HENT:     Head: Normocephalic and atraumatic.     Right Ear: Hearing and external ear normal.     Left Ear: Hearing and external ear normal.     Nose: No nasal deformity or rhinorrhea.  Eyes:     General: No scleral icterus.    Conjunctiva/sclera: Conjunctivae normal.     Right eye: Right conjunctiva is not injected.     Left eye: Left conjunctiva is not injected.     Pupils: Pupils are equal,  round, and reactive to light.  Neck:     Vascular: No JVD.  Cardiovascular:     Rate and Rhythm: Normal rate and regular rhythm.     Heart sounds: S1 normal and S2 normal.  Abdominal:     General: There is no distension.     Palpations: Abdomen is soft.     Tenderness: There is no abdominal tenderness.  Musculoskeletal:        General: Normal range of motion.     Right shoulder: Normal.     Left shoulder: Normal.     Cervical back: Normal range of motion and neck supple.     Right hip: Normal.     Left hip: Normal.     Right knee: Normal.     Left knee: Normal.  Lymphadenopathy:     Head:     Right side of head: No submandibular, preauricular or posterior auricular adenopathy.     Left side of head: No submandibular, preauricular or posterior auricular adenopathy.     Cervical: No cervical adenopathy.     Right cervical: No superficial or deep cervical adenopathy.    Left cervical: No superficial or deep cervical adenopathy.  Skin:    General: Skin is warm and dry.     Coloration: Skin is not pale.     Findings: No abrasion, bruising, ecchymosis, erythema, lesion or rash.     Nails: There is no clubbing.  Neurological:     General: No focal deficit present.     Mental Status:  She is alert and oriented to person, place, and time.     Sensory: No sensory deficit.     Coordination: Coordination normal.     Gait: Gait normal.  Psychiatric:        Attention and Perception: Attention normal. She is attentive.        Mood and Affect: Mood normal. Mood is not anxious.        Speech: Speech normal.        Behavior: Behavior normal. Behavior is cooperative.        Thought Content: Thought content normal.        Judgment: Judgment normal.     Prosthetic joint site September 16 2019:     Knee was stable today    Assessment & Plan:  PJI: Check her inflammatory markers yet again and we will plan on observing her off antibiotics and seeing her back in 3 months time.  We have gone over the warning signs of infection in particular recurrence of pain.  She still remembers how painful her knee was when her infection was floridly active.  Hypertension: On meds

## 2020-04-22 LAB — CBC WITH DIFFERENTIAL/PLATELET
Absolute Monocytes: 631 cells/uL (ref 200–950)
Basophils Absolute: 39 cells/uL (ref 0–200)
Basophils Relative: 0.4 %
Eosinophils Absolute: 310 cells/uL (ref 15–500)
Eosinophils Relative: 3.2 %
HCT: 40.5 % (ref 35.0–45.0)
Hemoglobin: 13.1 g/dL (ref 11.7–15.5)
Lymphs Abs: 1940 cells/uL (ref 850–3900)
MCH: 27.7 pg (ref 27.0–33.0)
MCHC: 32.3 g/dL (ref 32.0–36.0)
MCV: 85.6 fL (ref 80.0–100.0)
MPV: 9.1 fL (ref 7.5–12.5)
Monocytes Relative: 6.5 %
Neutro Abs: 6780 cells/uL (ref 1500–7800)
Neutrophils Relative %: 69.9 %
Platelets: 280 10*3/uL (ref 140–400)
RBC: 4.73 10*6/uL (ref 3.80–5.10)
RDW: 14.3 % (ref 11.0–15.0)
Total Lymphocyte: 20 %
WBC: 9.7 10*3/uL (ref 3.8–10.8)

## 2020-04-22 LAB — BASIC METABOLIC PANEL WITH GFR
BUN: 14 mg/dL (ref 7–25)
CO2: 30 mmol/L (ref 20–32)
Calcium: 10.1 mg/dL (ref 8.6–10.4)
Chloride: 104 mmol/L (ref 98–110)
Creat: 0.91 mg/dL (ref 0.50–0.99)
GFR, Est African American: 75 mL/min/{1.73_m2} (ref >=60)
GFR, Est Non African American: 64 mL/min/{1.73_m2} (ref >=60)
Glucose, Bld: 101 mg/dL — ABNORMAL HIGH (ref 65–99)
Potassium: 3.7 mmol/L (ref 3.5–5.3)
Sodium: 145 mmol/L (ref 135–146)

## 2020-04-22 LAB — C-REACTIVE PROTEIN: CRP: 7.2 mg/L (ref <8.0)

## 2020-04-22 LAB — SEDIMENTATION RATE: Sed Rate: 11 mm/h (ref 0–30)

## 2020-07-08 ENCOUNTER — Ambulatory Visit (INDEPENDENT_AMBULATORY_CARE_PROVIDER_SITE_OTHER): Payer: Medicare Other | Admitting: Infectious Disease

## 2020-07-08 ENCOUNTER — Other Ambulatory Visit: Payer: Self-pay

## 2020-07-08 ENCOUNTER — Encounter: Payer: Self-pay | Admitting: Infectious Disease

## 2020-07-08 VITALS — Wt 254.0 lb

## 2020-07-08 DIAGNOSIS — Z96652 Presence of left artificial knee joint: Secondary | ICD-10-CM | POA: Diagnosis not present

## 2020-07-08 DIAGNOSIS — M00262 Other streptococcal arthritis, left knee: Secondary | ICD-10-CM | POA: Diagnosis not present

## 2020-07-08 DIAGNOSIS — T8453XD Infection and inflammatory reaction due to internal right knee prosthesis, subsequent encounter: Secondary | ICD-10-CM

## 2020-07-08 NOTE — Progress Notes (Signed)
Subjective:  Chief complaint follow-up for prosthetic joint infection    Patient ID: Meredith Rose, female    DOB: 02-27-1950, 70 y.o.   MRN: [id number]  HPI   70 y.o. 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  We had her on ceftriaxone initially then changed over to continuous penicillin infusion which she has completed and then switched to amoxicillin.  Her pain had dramatically improved.  Inflammatory markers also trended down.    Last saw her she was through 9 months of antibiotics.  Her inflammatory markers were stable and I felt comfortable taking her off antibiotics.  She has done well off antibiotics with out any return of pain and says there is no pain in her knee at all.  She has been seen by Dr. Justine Null and is going to see her in 6 months time.  We will check inflammatory markers today and if they are reassuring she can come back as needed.    .  Past Medical History:  Diagnosis Date   Anxiety    Arthritis    OA   Congestion of throat    SINUSES, NONPRODUCTIVE COUGH   Hypertension    takes meds daily   Hypothyroidism    takes meds daily   Leg pain, right 07/30/2019   Ovarian cancer ([place]) 2002   Ruptured appendicitis 06/03/11   Squamous cell carcinoma 10/2009   right shin    Past Surgical History:  Procedure Laterality Date   ABDOMINAL HYSTERECTOMY  2002   "radical; for ovarian cancer"   ABDOMINAL WOUND DEHISCENCE  2002-2003   "twice"   APPENDECTOMY  06/05/2011   Procedure: APPENDECTOMY;  Surgeon: Gwenyth Ober, MD;  Location: [facility];  Service: General;;   CARPAL TUNNEL RELEASE Right AFTER 2013   I & D KNEE WITH POLY EXCHANGE Left 07/14/2019   Procedure: LEFT KNEE RADICAL IRRIGATION AND DEBRIDEMENT WITH POLY REVISION LEFT KNEE REPLACEMENT;  Surgeon: Frederik Pear, MD;  Location: [facility];  Service: Orthopedics;  Laterality: Left;   KNEE ARTHROSCOPY  ~ 2007   left   KNEE ARTHROSCOPY  12/2010   right   SQUAMOUS CELL CARCINOMA EXCISION   10/2009   right shin   TOTAL KNEE ARTHROPLASTY  09/22/2011   Procedure: TOTAL KNEE ARTHROPLASTY;  Surgeon: Yvette Rack., MD;  Location: [facility];  Service: Orthopedics;  Laterality: Right;   TOTAL KNEE ARTHROPLASTY Left 11/16/2017   Procedure: LEFT TOTAL KNEE ARTHROPLASTY;  Surgeon: Earlie Server, MD;  Location: [facility];  Service: Orthopedics;  Laterality: Left;    No family history on file.    Social History   Socioeconomic History   Marital status: Married    Spouse name: Not on file   Number of children: Not on file   Years of education: Not on file   Highest education level: Not on file  Occupational History   Not on file  Tobacco Use   Smoking status: Never   Smokeless tobacco: Never  Vaping Use   Vaping Use: Never used  Substance and Sexual Activity   Alcohol use: No    Comment: 06/20/11 "have drank once or twice in my life"   Drug use: No   Sexual activity: Yes  Other Topics Concern   Not on file  Social History Narrative   Not on file   Social Determinants of Health   Financial Resource Strain: Not on file  Food Insecurity: Not on file  Transportation Needs: Not  on file  Physical Activity: Not on file  Stress: Not on file  Social Connections: Not on file    Allergies  Allergen Reactions   Angiotensin Receptor Blockers Other (See Comments)   Tape Itching    BANDAID OR PAPER TAPE IF LEFT ON TOO LONG      Current Outpatient Medications:    amLODipine (NORVASC) 5 MG tablet, Take 5 mg by mouth daily., Disp: , Rfl:    aspirin EC 81 MG tablet, Take 1 tablet (81 mg total) by mouth 2 (two) times daily., Disp: 60 tablet, Rfl: 0   hydrochlorothiazide (HYDRODIURIL) 25 MG tablet, Take 25 mg by mouth daily., Disp: , Rfl:    KLOR-CON M20 20 MEQ tablet, Take 20 mEq by mouth 2 (two) times daily., Disp: , Rfl: 3   levothyroxine (SYNTHROID, LEVOTHROID) 100 MCG tablet, Take 100 mcg by mouth daily before breakfast., Disp: , Rfl:    oxyCODONE-acetaminophen  (PERCOCET/ROXICET) 5-325 MG tablet, Take 1 tablet by mouth every 4 (four) hours as needed for severe pain., Disp: 30 tablet, Rfl: 0   propranolol (INDERAL) 20 MG tablet, Take 20 mg by mouth daily., Disp: , Rfl:    sertraline (ZOLOFT) 100 MG tablet, Take 100 mg by mouth at bedtime. , Disp: , Rfl:    simvastatin (ZOCOR) 20 MG tablet, Take 20 mg by mouth at bedtime. , Disp: , Rfl:    tiZANidine (ZANAFLEX) 2 MG tablet, Take 1 tablet (2 mg total) by mouth every 6 (six) hours as needed., Disp: 60 tablet, Rfl: 0   VALTREX 1 g tablet, Take 2,000 mg by mouth 2 (two) times daily., Disp: , Rfl:    Review of Systems  Constitutional:  Negative for chills, diaphoresis and fever.  HENT:  Negative for congestion, hearing loss, nosebleeds, postnasal drip, rhinorrhea, sinus pressure, sneezing, sore throat and tinnitus.   Eyes:  Negative for photophobia.  Respiratory:  Negative for cough, shortness of breath and wheezing.   Cardiovascular:  Negative for chest pain, palpitations and leg swelling.  Gastrointestinal:  Negative for abdominal pain, blood in stool, constipation, diarrhea, nausea and vomiting.  Genitourinary:  Negative for dysuria, flank pain and hematuria.  Musculoskeletal:  Negative for back pain and myalgias.  Skin:  Negative for rash.  Neurological:  Negative for dizziness, speech difficulty, weakness, numbness and headaches.  Hematological:  Does not bruise/bleed easily.  Psychiatric/Behavioral:  Negative for agitation, behavioral problems, confusion, decreased concentration, dysphoric mood, hallucinations and suicidal ideas. The patient is not nervous/anxious.       Objective:   Physical Exam Constitutional:      General: She is not in acute distress.    Appearance: She is not diaphoretic.  HENT:     Head: Normocephalic and atraumatic.     Right Ear: External ear normal.     Left Ear: External ear normal.     Nose: Nose normal.     Mouth/Throat:     Pharynx: No oropharyngeal exudate.   Eyes:     General: No scleral icterus.       Right eye: No discharge.        Left eye: No discharge.     Extraocular Movements: Extraocular movements intact.     Conjunctiva/sclera: Conjunctivae normal.  Cardiovascular:     Rate and Rhythm: Normal rate and regular rhythm.  Pulmonary:     Effort: Pulmonary effort is normal. No respiratory distress.     Breath sounds: No wheezing.  Abdominal:     General: There  is no distension.     Palpations: Abdomen is soft.     Tenderness: There is no rebound.  Musculoskeletal:        General: No tenderness. Normal range of motion.     Cervical back: Normal range of motion and neck supple.  Lymphadenopathy:     Cervical: No cervical adenopathy.  Skin:    General: Skin is warm and dry.     Coloration: Skin is not jaundiced or pale.     Findings: No erythema, lesion or rash.  Neurological:     General: No focal deficit present.     Mental Status: She is alert and oriented to person, place, and time.     Coordination: Coordination normal.  Psychiatric:        Mood and Affect: Mood normal.        Behavior: Behavior normal.        Thought Content: Thought content normal.        Judgment: Judgment normal.    Prosthetic joint site September 16 2019:    Site today 07/08/2020:      Assessment & Plan:  PJI: Recheck her inflammatory markers today having been off antibiotics for several months.  If they are reassuring she can come back and see me as needed.  I spent more than 30 minutes with the patient including greater than 50% of time in face to face counseling of the patient personally reviewing radiographs, along with pertinent laboratory microbiological data review of medical records and in coordination of her care.

## 2020-07-09 ENCOUNTER — Telehealth: Payer: Self-pay

## 2020-07-09 LAB — CBC WITH DIFFERENTIAL/PLATELET
Absolute Monocytes: 575 cells/uL (ref 200–950)
Basophils Absolute: 43 cells/uL (ref 0–200)
Basophils Relative: 0.6 %
Eosinophils Absolute: 369 cells/uL (ref 15–500)
Eosinophils Relative: 5.2 %
HCT: 38.7 % (ref 35.0–45.0)
Hemoglobin: 12.8 g/dL (ref 11.7–15.5)
Lymphs Abs: 1583 cells/uL (ref 850–3900)
MCH: 27.9 pg (ref 27.0–33.0)
MCHC: 33.1 g/dL (ref 32.0–36.0)
MCV: 84.3 fL (ref 80.0–100.0)
MPV: 8.9 fL (ref 7.5–12.5)
Monocytes Relative: 8.1 %
Neutro Abs: 4530 cells/uL (ref 1500–7800)
Neutrophils Relative %: 63.8 %
Platelets: 234 10*3/uL (ref 140–400)
RBC: 4.59 10*6/uL (ref 3.80–5.10)
RDW: 14.3 % (ref 11.0–15.0)
Total Lymphocyte: 22.3 %
WBC: 7.1 10*3/uL (ref 3.8–10.8)

## 2020-07-09 LAB — BASIC METABOLIC PANEL WITH GFR
BUN: 13 mg/dL (ref 7–25)
CO2: 28 mmol/L (ref 20–32)
Calcium: 10 mg/dL (ref 8.6–10.4)
Chloride: 104 mmol/L (ref 98–110)
Creat: 0.73 mg/dL (ref 0.60–0.93)
GFR, Est African American: 97 mL/min/{1.73_m2} (ref >=60)
GFR, Est Non African American: 83 mL/min/{1.73_m2} (ref >=60)
Glucose, Bld: 91 mg/dL (ref 65–99)
Potassium: 3.3 mmol/L — ABNORMAL LOW (ref 3.5–5.3)
Sodium: 141 mmol/L (ref 135–146)

## 2020-07-09 LAB — C-REACTIVE PROTEIN: CRP: 13.8 mg/L — ABNORMAL HIGH (ref <8.0)

## 2020-07-09 LAB — SEDIMENTATION RATE: Sed Rate: 6 mm/h (ref 0–30)

## 2020-07-09 NOTE — Telephone Encounter (Signed)
-----  Message from Truman Hayward, MD sent at 07/09/2020  8:34 AM EDT ----- Patients ESR is encouraging but her CRP is up. I would continue to monitor off antibiotics but if she would like I think might be a good idea for her to schedule another followup with me in about 2-3 months ----- Message ----- From: Interface, [facility] Lab Results In Sent: 07/08/2020   3:30 PM EDT To: Truman Hayward, MD

## 2020-07-09 NOTE — Telephone Encounter (Signed)
Patient aware of most recent results and agreed to follow up appointment in 2 months.  Meredith Rose

## 2020-09-08 ENCOUNTER — Ambulatory Visit (INDEPENDENT_AMBULATORY_CARE_PROVIDER_SITE_OTHER): Payer: Medicare Other | Admitting: Infectious Disease

## 2020-09-08 ENCOUNTER — Other Ambulatory Visit: Payer: Self-pay

## 2020-09-08 VITALS — BP 137/83 | HR 55 | Temp 97.5°F | Resp 16 | Ht 65.0 in | Wt 243.0 lb

## 2020-09-08 DIAGNOSIS — T8453XD Infection and inflammatory reaction due to internal right knee prosthesis, subsequent encounter: Secondary | ICD-10-CM

## 2020-09-08 DIAGNOSIS — I10 Essential (primary) hypertension: Secondary | ICD-10-CM | POA: Diagnosis not present

## 2020-09-08 DIAGNOSIS — M00262 Other streptococcal arthritis, left knee: Secondary | ICD-10-CM

## 2020-09-08 NOTE — Progress Notes (Signed)
Subjective:  Chief complaint: Follow-up for prosthetic joint infection    Patient ID: Meredith Rose, female    DOB: January 24, 1951, 70 y.o.   MRN: [id number]  HPI   70 y.o. 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  We had her on ceftriaxone initially then changed over to continuous penicillin infusion which she has completed and then switched to amoxicillin.  Her pain had dramatically improved.  Inflammatory markers also trended down.    She got through 9 months of antibiotics.  Her inflammatory markers were stable and I felt comfortable taking her off antibiotics.  She had done well off antibiotics with out any return of pain and says there is no pain in her knee at all.  I rechecked her inflammatory markers at the last visit and her sed rate was completely normal but CRP had gone up.  Because of this I requested her to come back and see me 1 more time after 2 months.  She is continue to feel well and has had no new pain in her knee and no systemic symptoms to suggest infection.  We will recheck the inflammatory markers but I will plan on having him come back to see me only as needed.    .  Past Medical History:  Diagnosis Date   Anxiety    Arthritis    OA   Congestion of throat    SINUSES, NONPRODUCTIVE COUGH   Hypertension    takes meds daily   Hypothyroidism    takes meds daily   Leg pain, right 07/30/2019   Ovarian cancer ([place]) 2002   Ruptured appendicitis 06/03/11   Squamous cell carcinoma 10/2009   right shin    Past Surgical History:  Procedure Laterality Date   ABDOMINAL HYSTERECTOMY  2002   "radical; for ovarian cancer"   ABDOMINAL WOUND DEHISCENCE  2002-2003   "twice"   APPENDECTOMY  06/05/2011   Procedure: APPENDECTOMY;  Surgeon: Gwenyth Ober, MD;  Location: [place];  Service: General;;   CARPAL TUNNEL RELEASE Right AFTER 2013   I & D KNEE WITH POLY EXCHANGE Left 07/14/2019   Procedure: LEFT KNEE RADICAL IRRIGATION AND DEBRIDEMENT WITH  POLY REVISION LEFT KNEE REPLACEMENT;  Surgeon: Frederik Pear, MD;  Location: [facility];  Service: Orthopedics;  Laterality: Left;   KNEE ARTHROSCOPY  ~ 2007   left   KNEE ARTHROSCOPY  12/2010   right   SQUAMOUS CELL CARCINOMA EXCISION  10/2009   right shin   TOTAL KNEE ARTHROPLASTY  09/22/2011   Procedure: TOTAL KNEE ARTHROPLASTY;  Surgeon: Yvette Rack., MD;  Location: [facility];  Service: Orthopedics;  Laterality: Right;   TOTAL KNEE ARTHROPLASTY Left 11/16/2017   Procedure: LEFT TOTAL KNEE ARTHROPLASTY;  Surgeon: Earlie Server, MD;  Location: [facility];  Service: Orthopedics;  Laterality: Left;    No family history on file.    Social History   Socioeconomic History   Marital status: Married    Spouse name: Not on file   Number of children: Not on file   Years of education: Not on file   Highest education level: Not on file  Occupational History   Not on file  Tobacco Use   Smoking status: Never   Smokeless tobacco: Never  Vaping Use   Vaping Use: Never used  Substance and Sexual Activity   Alcohol use: No    Comment: 06/20/11 "have drank once or twice in my life"   Drug use: No  Sexual activity: Yes  Other Topics Concern   Not on file  Social History Narrative   Not on file   Social Determinants of Health   Financial Resource Strain: Not on file  Food Insecurity: Not on file  Transportation Needs: Not on file  Physical Activity: Not on file  Stress: Not on file  Social Connections: Not on file    Allergies  Allergen Reactions   Angiotensin Receptor Blockers Other (See Comments)   Tape Itching    BANDAID OR PAPER TAPE IF LEFT ON TOO LONG      Current Outpatient Medications:    amLODipine (NORVASC) 5 MG tablet, Take 5 mg by mouth daily., Disp: , Rfl:    aspirin EC 81 MG tablet, Take 1 tablet (81 mg total) by mouth 2 (two) times daily., Disp: 60 tablet, Rfl: 0   hydrochlorothiazide (HYDRODIURIL) 25 MG tablet, Take 25 mg by mouth daily., Disp: , Rfl:    KLOR-CON  M20 20 MEQ tablet, Take 20 mEq by mouth 2 (two) times daily., Disp: , Rfl: 3   levothyroxine (SYNTHROID, LEVOTHROID) 100 MCG tablet, Take 100 mcg by mouth daily before breakfast., Disp: , Rfl:    Nasal Dilators (PROVENT SLEEP APNEA THERAPY) MISC, by Does not apply route. Patient using CPAP machine due to Sleep Apnea., Disp: , Rfl:    propranolol (INDERAL) 20 MG tablet, Take 20 mg by mouth daily., Disp: , Rfl:    sertraline (ZOLOFT) 100 MG tablet, Take 100 mg by mouth at bedtime. , Disp: , Rfl:    simvastatin (ZOCOR) 20 MG tablet, Take 20 mg by mouth at bedtime. , Disp: , Rfl:    Review of Systems  Constitutional:  Negative for activity change, appetite change, chills, diaphoresis, fatigue, fever and unexpected weight change.  HENT:  Negative for congestion, rhinorrhea, sinus pressure, sneezing, sore throat and trouble swallowing.   Eyes:  Negative for photophobia and visual disturbance.  Respiratory:  Negative for cough, chest tightness, shortness of breath, wheezing and stridor.   Cardiovascular:  Negative for chest pain, palpitations and leg swelling.  Gastrointestinal:  Negative for abdominal distention, abdominal pain, anal bleeding, blood in stool, constipation, diarrhea, nausea and vomiting.  Genitourinary:  Negative for difficulty urinating, dysuria, flank pain and hematuria.  Musculoskeletal:  Negative for arthralgias, back pain, gait problem, joint swelling and myalgias.  Skin:  Negative for color change, pallor, rash and wound.  Neurological:  Negative for dizziness, tremors, weakness and light-headedness.  Hematological:  Negative for adenopathy. Does not bruise/bleed easily.  Psychiatric/Behavioral:  Negative for agitation, behavioral problems, confusion, decreased concentration, dysphoric mood and sleep disturbance.       Objective:   Physical Exam Constitutional:      General: She is not in acute distress.    Appearance: Normal appearance. She is well-developed. She is not  ill-appearing or diaphoretic.  HENT:     Head: Normocephalic and atraumatic.     Right Ear: Hearing and external ear normal.     Left Ear: Hearing and external ear normal.     Nose: No nasal deformity or rhinorrhea.  Eyes:     General: No scleral icterus.    Conjunctiva/sclera: Conjunctivae normal.     Right eye: Right conjunctiva is not injected.     Left eye: Left conjunctiva is not injected.     Pupils: Pupils are equal, round, and reactive to light.  Neck:     Vascular: No JVD.  Cardiovascular:     Rate and Rhythm:  Normal rate and regular rhythm.     Heart sounds: S1 normal and S2 normal.  Pulmonary:     Effort: No respiratory distress.     Breath sounds: No rales.  Abdominal:     General: There is no distension.     Palpations: Abdomen is soft.     Tenderness: There is no abdominal tenderness.  Musculoskeletal:        General: Normal range of motion.     Right shoulder: Normal.     Left shoulder: Normal.     Cervical back: Normal range of motion and neck supple.     Right hip: Normal.     Left hip: Normal.     Right knee: Normal.     Left knee: Normal.  Lymphadenopathy:     Head:     Right side of head: No submandibular, preauricular or posterior auricular adenopathy.     Left side of head: No submandibular, preauricular or posterior auricular adenopathy.     Cervical: No cervical adenopathy.     Right cervical: No superficial or deep cervical adenopathy.    Left cervical: No superficial or deep cervical adenopathy.  Skin:    General: Skin is warm and dry.     Coloration: Skin is not pale.     Findings: No abrasion, bruising, ecchymosis, erythema, lesion or rash.     Nails: There is no clubbing.  Neurological:     General: No focal deficit present.     Mental Status: She is alert and oriented to person, place, and time.     Sensory: No sensory deficit.     Coordination: Coordination normal.     Gait: Gait normal.  Psychiatric:        Attention and Perception:  She is attentive.        Mood and Affect: Mood normal.        Speech: Speech normal.        Behavior: Behavior normal. Behavior is cooperative.        Thought Content: Thought content normal.        Judgment: Judgment normal.    Prosthetic knee site is clean and not tender      Assessment & Plan:  Prosthetic joint infection: This appears clinically to have been cured I am rechecking her sed rate and CRP and a BMPbut I am not going to insist on her coming back unless she develops symptoms suggestive of infection.  [name] is also now only seeing her back as needed  Hypertension: BP well controlled on inderal and amlodipine  Vitals:   09/08/20 0944  BP: 137/83  Pulse: (!) 55  Resp: 16  Temp: (!) 97.5 F (36.4 C)  SpO2: 98%

## 2020-09-09 LAB — BASIC METABOLIC PANEL WITH GFR
BUN: 13 mg/dL (ref 7–25)
CO2: 28 mmol/L (ref 20–32)
Calcium: 10 mg/dL (ref 8.6–10.4)
Chloride: 106 mmol/L (ref 98–110)
Creat: 0.73 mg/dL (ref 0.60–1.00)
Glucose, Bld: 107 mg/dL — ABNORMAL HIGH (ref 65–99)
Potassium: 3.4 mmol/L — ABNORMAL LOW (ref 3.5–5.3)
Sodium: 143 mmol/L (ref 135–146)
eGFR: 88 mL/min/{1.73_m2} (ref >=60)

## 2020-09-09 LAB — SEDIMENTATION RATE: Sed Rate: 9 mm/h (ref 0–30)

## 2020-09-09 LAB — C-REACTIVE PROTEIN: CRP: 7.3 mg/L (ref <8.0)

## 2020-12-02 ENCOUNTER — Encounter: Payer: Self-pay | Admitting: Neurology

## 2020-12-06 ENCOUNTER — Other Ambulatory Visit: Payer: Self-pay

## 2020-12-06 DIAGNOSIS — R202 Paresthesia of skin: Secondary | ICD-10-CM

## 2020-12-30 ENCOUNTER — Encounter: Payer: Medicare Other | Admitting: Neurology

## 2021-01-06 ENCOUNTER — Ambulatory Visit (INDEPENDENT_AMBULATORY_CARE_PROVIDER_SITE_OTHER): Payer: Medicare Other | Admitting: Neurology

## 2021-01-06 ENCOUNTER — Other Ambulatory Visit: Payer: Self-pay

## 2021-01-06 DIAGNOSIS — R202 Paresthesia of skin: Secondary | ICD-10-CM

## 2021-01-06 DIAGNOSIS — G5602 Carpal tunnel syndrome, left upper limb: Secondary | ICD-10-CM

## 2021-01-06 NOTE — Procedures (Signed)
[facility]  [address]  [place], [place] [address] Tel: [phone] Fax:  [phone] Test Date:  01/06/2021  Patient: Meredith Rose DOB: 1950/09/22 Physician: Narda Amber, DO  Sex: Female Height: 5\' 5"  Ref Phys: Faythe Casa, MD  ID#: [id number]   Technician:    Patient Complaints: This is a 70 year old female referred for evaluation of left hand paresthesias.  NCV & EMG Findings: Extensive electrodiagnostic testing of the left upper extremity shows:  Left median sensory response is absent.  Left ulnar sensory responses within normal limits. Left median motor response is absent, however stimulating at the [facility] generates a motor response recording at the abductor pollicis brevis, consistent with a Martin-Gruber anastomoses, a normal anatomic variant.  Left ulnar motor responses within normal limits. Chronic motor axonal loss changes are seen affecting the left abductor pollicis brevis muscle, without accompanying active denervation.  Impression: Left median neuropathy at or distal to the wrist, consistent with a clinical diagnosis of carpal tunnel syndrome.  Overall, these findings are very severe in degree electrically. Incidentally, there is a left Martin-Gruber anastomoses, a normal anatomic variant.   ___________________________ Narda Amber, DO    Nerve Conduction Studies Anti Sensory Summary Table   Stim Site NR Peak (ms) Norm Peak (ms) P-T Amp (V) Norm P-T Amp  Left Median Anti Sensory (2nd Digit)  33C  Wrist NR  <3.8  >10  Left Ulnar Anti Sensory (5th Digit)  33C  Wrist    2.6 <3.2 29.4 >5   Motor Summary Table   Stim Site NR Onset (ms) Norm Onset (ms) O-P Amp (mV) Norm O-P Amp Site1 Site2 Delta-0 (ms) Dist (cm) Vel (m/s) Norm Vel (m/s)  Left Median Motor (Abd Poll Brev)  33C  Wrist NR  <4.0  >5 Elbow Wrist  0.0  >50  Elbow NR     Ulnar-wrist crossover Elbow  0.0    Ulnar-wrist crossover    4.2  5.4         Left Ulnar  Motor (Abd Dig Minimi)  33C  Wrist    2.3 <3.1 7.8 >7 B Elbow Wrist 3.5 21.0 60 >50  B Elbow    5.8  6.7  A Elbow B Elbow 1.5 10.0 67 >50  A Elbow    7.3  6.4          EMG   Side Muscle Ins Act Fibs Psw Fasc Number Recrt Dur Dur. Amp Amp. Poly Poly. Comment  Left 1stDorInt Nml Nml Nml Nml Nml Nml Nml Nml Nml Nml Nml Nml N/A  Left Abd Poll Brev Nml Nml Nml Nml SMU Rapid All 1+ All 1+ All 1+ ATR  Left PronatorTeres Nml Nml Nml Nml Nml Nml Nml Nml Nml Nml Nml Nml N/A  Left Biceps Nml Nml Nml Nml Nml Nml Nml Nml Nml Nml Nml Nml N/A  Left Triceps Nml Nml Nml Nml Nml Nml Nml Nml Nml Nml Nml Nml N/A  Left Deltoid Nml Nml Nml Nml Nml Nml Nml Nml Nml Nml Nml Nml N/A      Waveforms:

## 2022-04-04 IMAGING — DX DG CHEST 2V
2 series · 2 of 2 positions shown · non-contrast
Comparison: 11/08/2017

CLINICAL DATA: Septic arthritis of left knee.

EXAM:
CHEST - 2 VIEW

[chest pa]
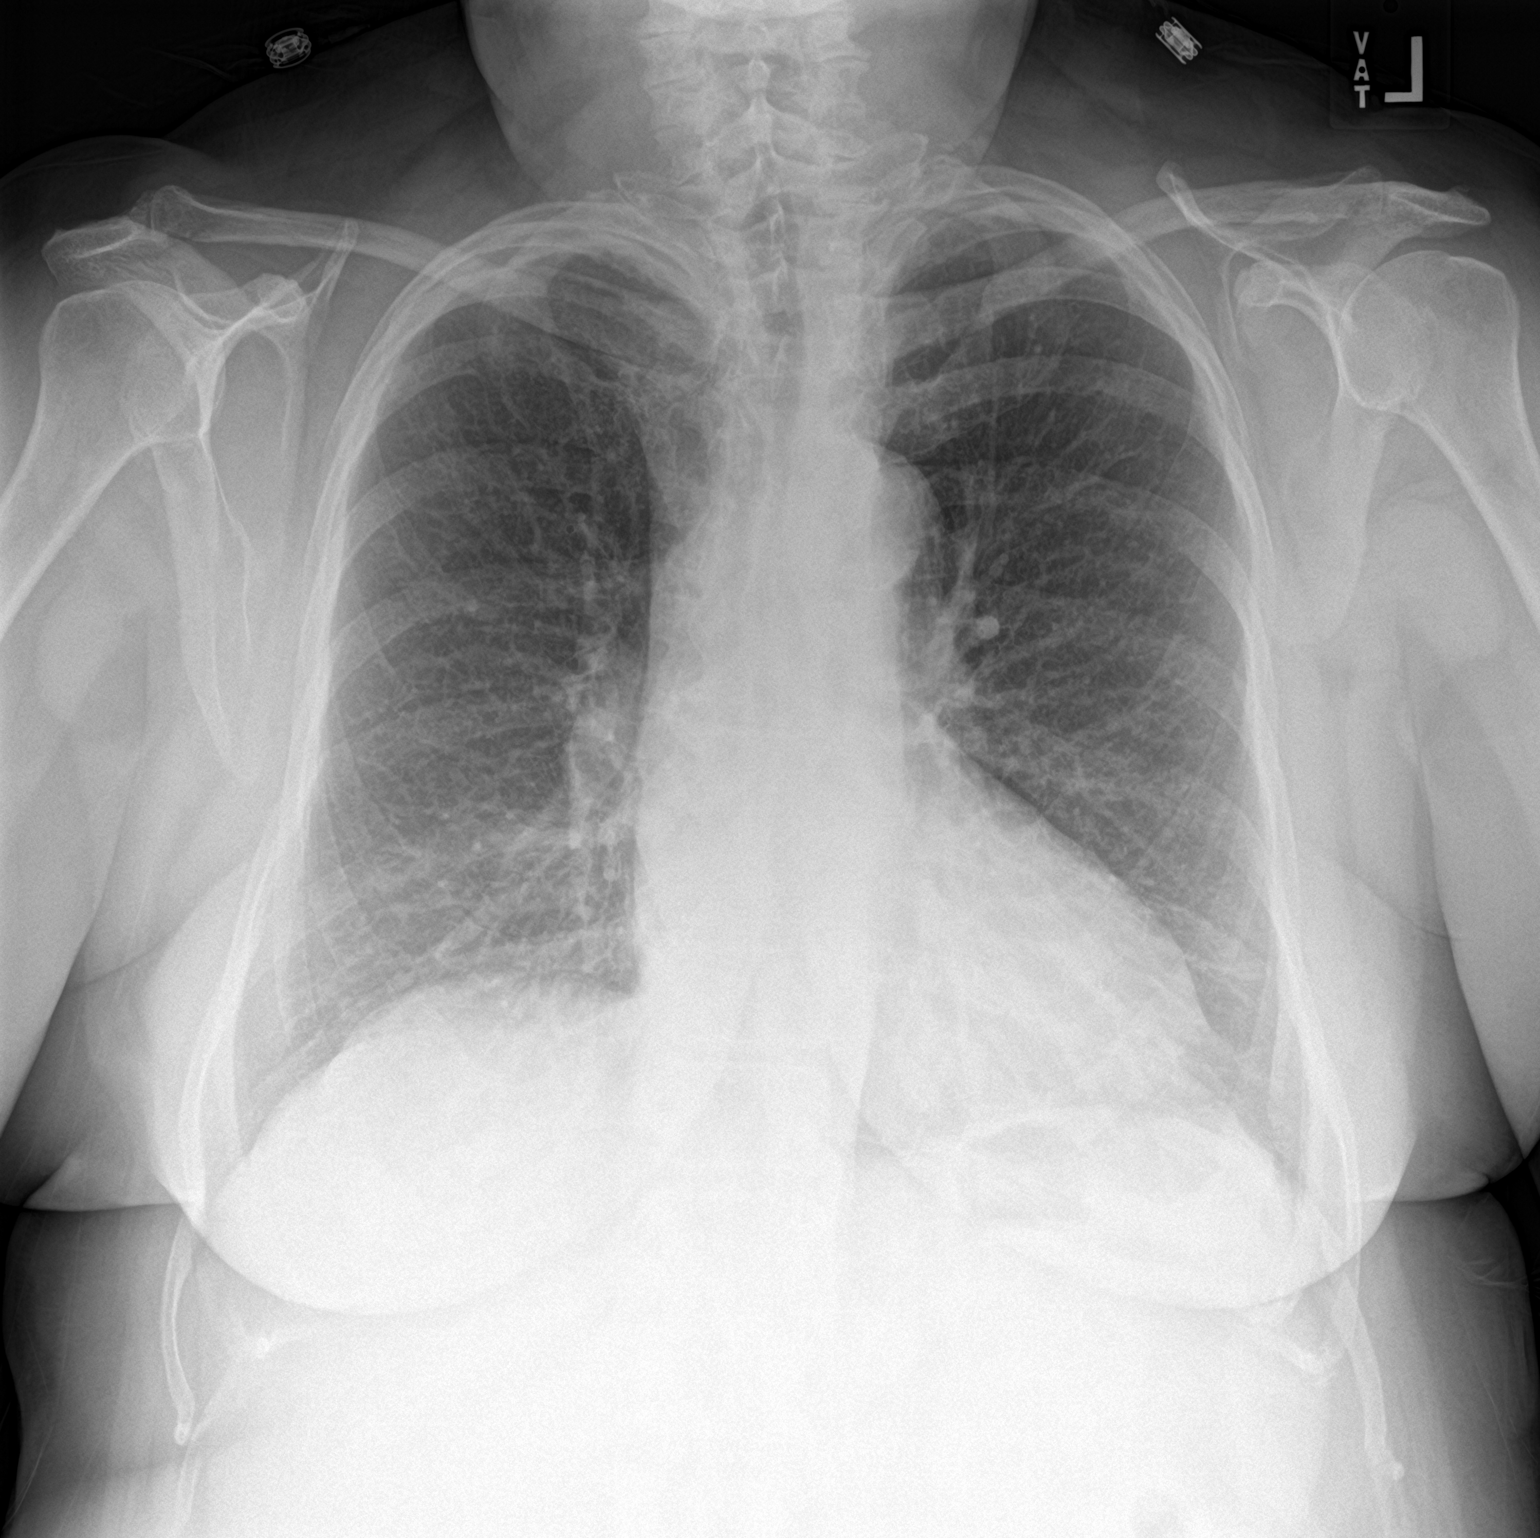

[chest lat]
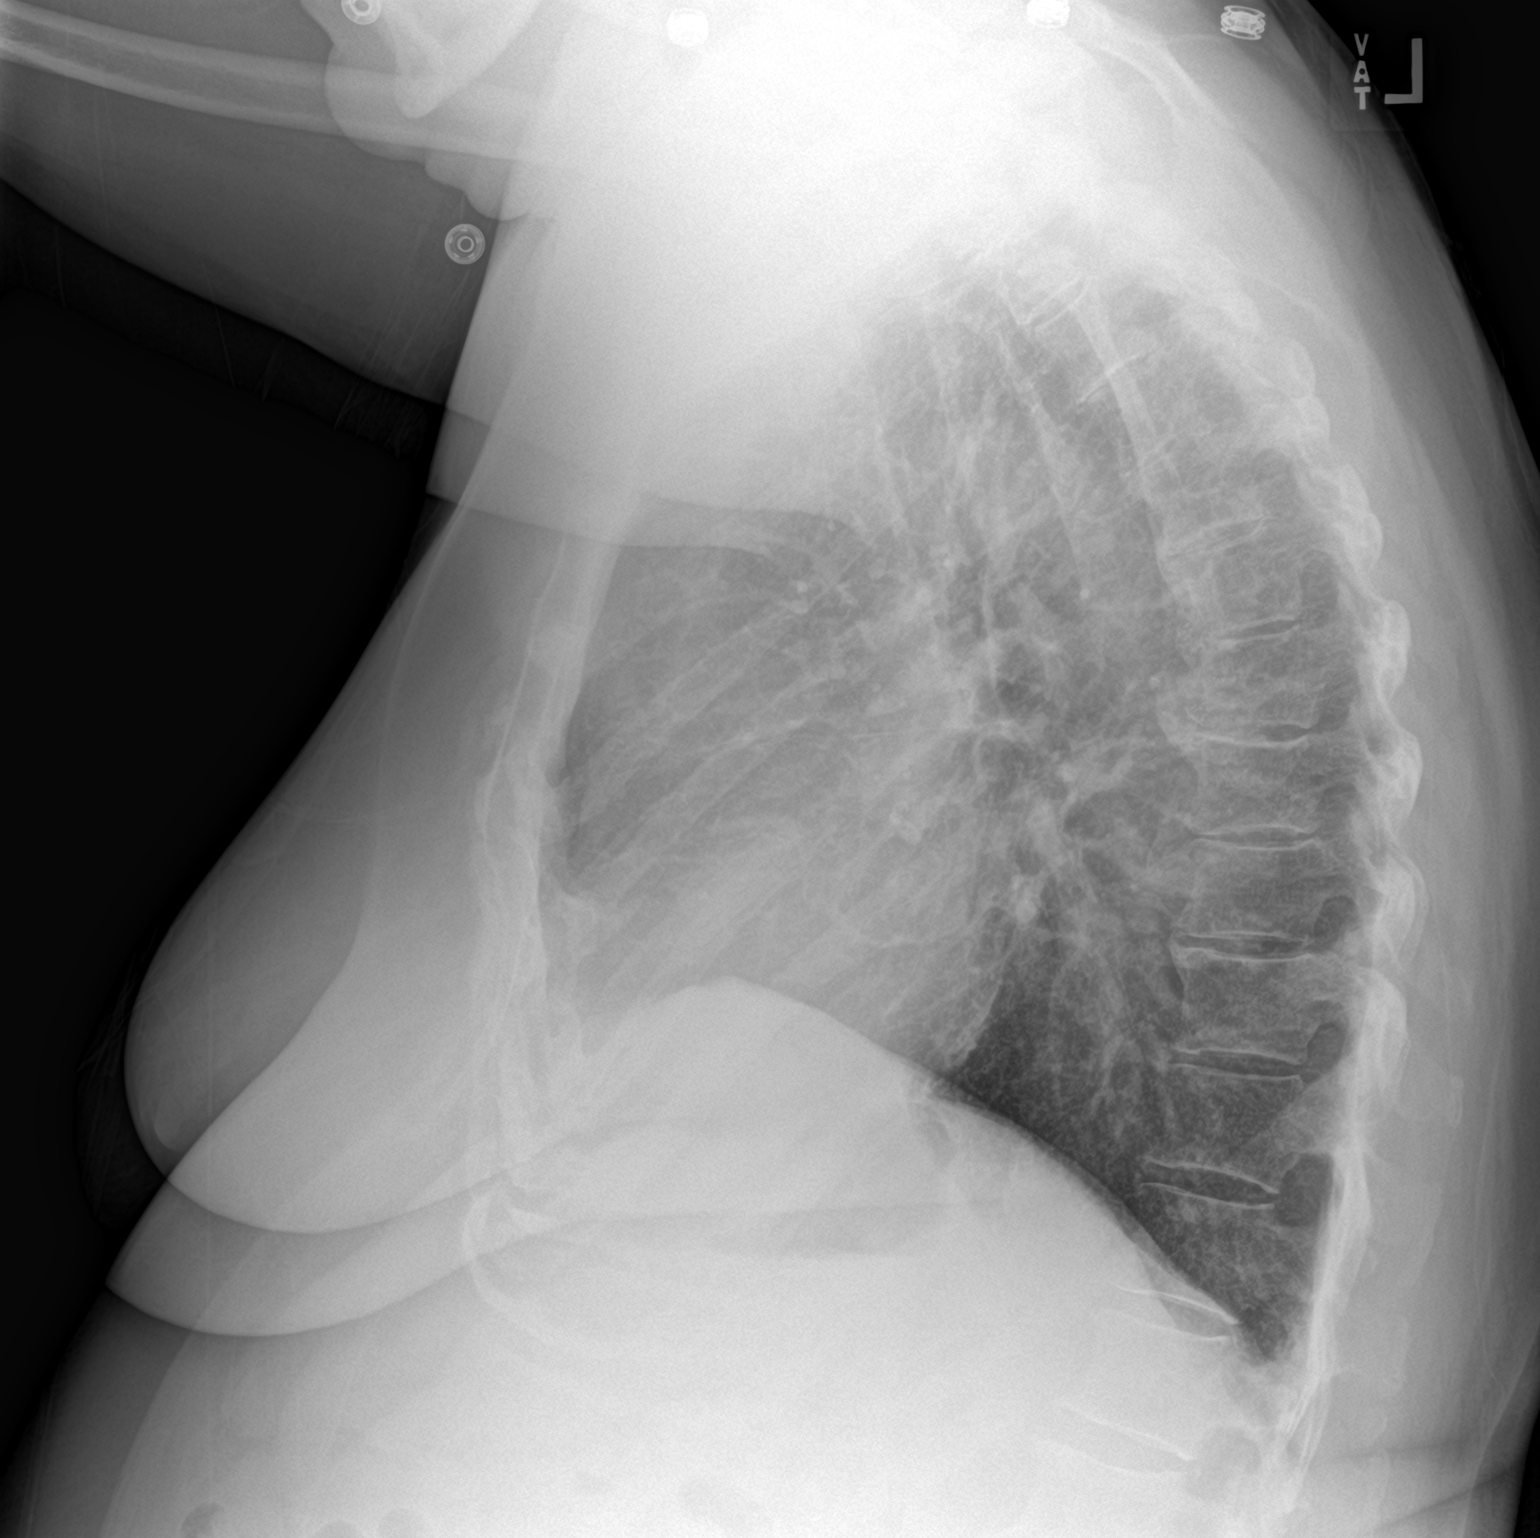

[2 of 2 positions shown; findings below may reference images not displayed]

FINDINGS: The cardiomediastinal contours are normal. Chronic bronchial and
interstitial thickening, unchanged from prior exam. Subsegmental
atelectasis at the left lung base. Mild biapical pleuroparenchymal
scarring. Pulmonary vasculature is normal. No consolidation, pleural
effusion, or pneumothorax. No acute osseous abnormalities are seen.
IMPRESSION: 1. Chronic bronchial and interstitial thickening.
2. Subsegmental atelectasis at the left lung base.

## 2022-08-21 ENCOUNTER — Ambulatory Visit: Payer: Self-pay | Admitting: Surgery

## 2022-08-21 NOTE — H&P (View-Only) (Signed)
 History of Present Illness: Meredith Rose is a 72 y.o. female who is seen today as an office consultation for evaluation of NEW PATIENT ( 17cm hepatic cyst)   Meredith Rose is a 72 yo female who was referred for evaluation of a liver cyst. She says that over the last few months, she has noticed RUQ discomfort. She has a feeling of fullness and pressure on the right side of her abdomen, which seems worse with movement. She has also been having some RUQ pain, which usually occurs at night shortly after eating dinner. Her most recent episode of pain was a few days ago. She was referred for a RUQ [place] on 6/14, which showed gallstones without signs of acute cholecystitis, as well as a 17cm cyst in the right liver. Of note, this cyst was present on a prior CT scan in 2013, but was smaller at that time at approximately 9cm. She was referred to discuss surgical treatment of her cyst.   Prior abdominal surgeries include a laparoscopic converted to open appendectomy in 2013, and a radical hysterectomy in 2002 for cancer. She is not on any blood thinners. 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     Review of Systems: A complete review of systems was obtained from the patient.  I have reviewed this information and discussed as appropriate with the patient.  See HPI as well for other ROS.     Medical History: Past Medical History Past Medical History: Diagnosis Date  Anxiety    Arthritis    Hyperlipidemia    Hypertension    Sleep apnea    Thyroid disease         Problem List There is no problem list on file for this patient.     Past Surgical History Past Surgical History: Procedure Laterality Date  HYSTERECTOMY   2002   ABD  SQUAMOUS CELL CARCINOMA SURGERY    10/2009  CARPAL TUNNEL SURGERY Right 2013  APPENDECTOMY   06/05/2011  KNEE SURGERY Left 11/16/2017   TOTAL      Allergies Allergies Allergen Reactions  Adhesive Itching     BANDAID OR PAPER TAPE IF LEFT ON TOO  LONG      Medications Ordered Prior to Encounter Current Outpatient Medications on File Prior to Visit Medication Sig Dispense Refill  amLODIPine (NORVASC) 5 MG tablet Take 5 mg by mouth once daily      hydroCHLOROthiazide (HYDRODIURIL) 25 MG tablet Take 25 mg by mouth every morning      KLOR-CON M20 20 mEq ER tablet Take 1 tablet by mouth once daily      levothyroxine (SYNTHROID) 100 MCG tablet 1 TABLET IN THE MORNING ON AN EMPTY STOMACH BY MOUTH ONCE A DAY 90 DAYS      propranoloL (INDERAL) 20 MG tablet Take 20 mg by mouth once daily      sertraline (ZOLOFT) 100 MG tablet Take 100 mg by mouth once daily      simvastatin (ZOCOR) 20 MG tablet Take 20 mg by mouth every evening        No current facility-administered medications on file prior to visit.      Family History History reviewed. No pertinent family history.     Tobacco Use History Social History    Tobacco Use Smoking Status Never Smokeless Tobacco Never      Social History Social History    Socioeconomic History  Marital status: Married Tobacco Use  Smoking status: Never  Smokeless tobacco: Never  Substance and Sexual Activity  Alcohol use: Not Currently  Drug use: Never      Objective:   Vitals:   08/21/22 1021 08/21/22 1027 BP: (!) 141/78   Pulse: 55   Temp: 36.7 C (98 F)   SpO2: 98%   Weight: (!) 104.3 kg (230 lb)   Height: 165.1 cm (5\' 5" )   PainSc:   0-No pain PainLoc:   Abdomen   Body mass index is 38.27 kg/m.   Physical Exam Vitals reviewed.  Constitutional:      General: She is not in acute distress.    Appearance: Normal appearance.  HENT:     Head: Normocephalic and atraumatic.  Eyes:     General: No scleral icterus.    Conjunctiva/sclera: Conjunctivae normal.  Pulmonary:     Effort: Pulmonary effort is normal. No respiratory distress.     Breath sounds: Normal breath sounds. No wheezing.  Abdominal:     General: There is no distension.     Palpations: Abdomen is  soft.     Comments: Fullness in the RUQ with a palpable mass, corresponding with the known cyst on imaging. Diastasis rectus. No surgical scars in the upper abdomen.  Neurological:     Mental Status: She is alert.            Assessment and Plan:   Assessment Diagnoses and all orders for this visit:   Hepatic cyst   Symptomatic cholelithiasis     This is a 72 yo female presenting with RUQ pain, fullness and discomfort. She has a large liver cyst which appears to be causing symptoms from mass effect. It was present in 2013 on CT scan and appears to be a benign simple cyst, however it has enlarged significantly. She also has symptoms that are more consistent with biliary colic (postprandial RUQ pain), and given her gallstones, I also recommended a cholecystectomy. She was noted to have significant pelvic adhesions at the time of her appendectomy, but no scars in the upper abdomen thus I anticipate a laparoscopic approach will be successful. I discussed a laparoscopic hepatic cyst fenestration and cholecystectomy. I reviewed the benefits and risks including bleeding, infection, post-cholecystectomy diarrhea, and bile duct injury. She expressed understanding and agrees to proceed with surgery. She will be contacted to schedule an elective surgery date. Will likely be observed overnight postoperatively.   Sophronia Simas, MD [facility], Hepatobiliary and Pancreatic Surgery 08/21/22 2:34 PM

## 2022-08-21 NOTE — H&P (Signed)
History of Present Illness: Meredith Rose is a 72 y.o. female who is seen today as an office consultation for evaluation of NEW PATIENT ( 17cm hepatic cyst)   Meredith Rose is a 72 yo female who was referred for evaluation of a liver cyst. She says that over the last few months, she has noticed RUQ discomfort. She has a feeling of fullness and pressure on the right side of her abdomen, which seems worse with movement. She has also been having some RUQ pain, which usually occurs at night shortly after eating dinner. Her most recent episode of pain was a few days ago. She was referred for a RUQ [place] on 6/14, which showed gallstones without signs of acute cholecystitis, as well as a 17cm cyst in the right liver. Of note, this cyst was present on a prior CT scan in 2013, but was smaller at that time at approximately 9cm. She was referred to discuss surgical treatment of her cyst.   Prior abdominal surgeries include a laparoscopic converted to open appendectomy in 2013, and a radical hysterectomy in 2002 for cancer. She is not on any blood thinners. 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     Review of Systems: A complete review of systems was obtained from the patient.  I have reviewed this information and discussed as appropriate with the patient.  See HPI as well for other ROS.     Medical History: Past Medical History Past Medical History: Diagnosis Date  Anxiety    Arthritis    Hyperlipidemia    Hypertension    Sleep apnea    Thyroid disease         Problem List There is no problem list on file for this patient.     Past Surgical History Past Surgical History: Procedure Laterality Date  HYSTERECTOMY   2002   ABD  SQUAMOUS CELL CARCINOMA SURGERY    10/2009  CARPAL TUNNEL SURGERY Right 2013  APPENDECTOMY   06/05/2011  KNEE SURGERY Left 11/16/2017   TOTAL      Allergies Allergies Allergen Reactions  Adhesive Itching     BANDAID OR PAPER TAPE IF LEFT ON TOO  LONG      Medications Ordered Prior to Encounter Current Outpatient Medications on File Prior to Visit Medication Sig Dispense Refill  amLODIPine (NORVASC) 5 MG tablet Take 5 mg by mouth once daily      hydroCHLOROthiazide (HYDRODIURIL) 25 MG tablet Take 25 mg by mouth every morning      KLOR-CON M20 20 mEq ER tablet Take 1 tablet by mouth once daily      levothyroxine (SYNTHROID) 100 MCG tablet 1 TABLET IN THE MORNING ON AN EMPTY STOMACH BY MOUTH ONCE A DAY 90 DAYS      propranoloL (INDERAL) 20 MG tablet Take 20 mg by mouth once daily      sertraline (ZOLOFT) 100 MG tablet Take 100 mg by mouth once daily      simvastatin (ZOCOR) 20 MG tablet Take 20 mg by mouth every evening        No current facility-administered medications on file prior to visit.      Family History History reviewed. No pertinent family history.     Tobacco Use History Social History    Tobacco Use Smoking Status Never Smokeless Tobacco Never      Social History Social History    Socioeconomic History  Marital status: Married Tobacco Use  Smoking status: Never  Smokeless tobacco: Never  Substance and Sexual Activity  Alcohol use: Not Currently  Drug use: Never      Objective:   Vitals:   08/21/22 1021 08/21/22 1027 BP: (!) 141/78   Pulse: 55   Temp: 36.7 C (98 F)   SpO2: 98%   Weight: (!) 104.3 kg (230 lb)   Height: 165.1 cm (5\' 5" )   PainSc:   0-No pain PainLoc:   Abdomen   Body mass index is 38.27 kg/m.   Physical Exam Vitals reviewed.  Constitutional:      General: She is not in acute distress.    Appearance: Normal appearance.  HENT:     Head: Normocephalic and atraumatic.  Eyes:     General: No scleral icterus.    Conjunctiva/sclera: Conjunctivae normal.  Pulmonary:     Effort: Pulmonary effort is normal. No respiratory distress.     Breath sounds: Normal breath sounds. No wheezing.  Abdominal:     General: There is no distension.     Palpations: Abdomen is  soft.     Comments: Fullness in the RUQ with a palpable mass, corresponding with the known cyst on imaging. Diastasis rectus. No surgical scars in the upper abdomen.  Neurological:     Mental Status: She is alert.            Assessment and Plan:   Assessment Diagnoses and all orders for this visit:   Hepatic cyst   Symptomatic cholelithiasis     This is a 72 yo female presenting with RUQ pain, fullness and discomfort. She has a large liver cyst which appears to be causing symptoms from mass effect. It was present in 2013 on CT scan and appears to be a benign simple cyst, however it has enlarged significantly. She also has symptoms that are more consistent with biliary colic (postprandial RUQ pain), and given her gallstones, I also recommended a cholecystectomy. She was noted to have significant pelvic adhesions at the time of her appendectomy, but no scars in the upper abdomen thus I anticipate a laparoscopic approach will be successful. I discussed a laparoscopic hepatic cyst fenestration and cholecystectomy. I reviewed the benefits and risks including bleeding, infection, post-cholecystectomy diarrhea, and bile duct injury. She expressed understanding and agrees to proceed with surgery. She will be contacted to schedule an elective surgery date. Will likely be observed overnight postoperatively.   Sophronia Simas, MD [facility], Hepatobiliary and Pancreatic Surgery 08/21/22 2:34 PM

## 2022-08-30 NOTE — Progress Notes (Signed)
Surgical Instructions   Your procedure is scheduled on Friday September 08, 2022. Report to [facility] Main Entrance "A" at 9:30 A.M., then check in with the Admitting office. Any questions or running late day of surgery: call [phone]  Questions prior to your surgery date: call [phone], Monday-Friday, 8am-4pm. If you experience any cold or flu symptoms such as cough, fever, chills, shortness of breath, etc. between now and your scheduled surgery, please notify us at the above number.     Remember:  Do not eat after midnight the night before your surgery   You may drink clear liquids until 8:30 the morning of your surgery.   Clear liquids allowed are: Water, Non-Citrus Juices (without pulp), Carbonated Beverages, Clear Tea, Black Coffee Only (NO MILK, CREAM OR POWDERED CREAMER of any kind), and Gatorade.    Take these medicines the morning of surgery with A SIP OF WATER  amLODipine (NORVASC)  levothyroxine (SYNTHROID, LEVOTHROID)  Polyethyl Glycol-Propyl Glycol (SYSTANE OP)  propranolol (INDERAL)   May take these medicines IF NEEDED: acetaminophen (TYLENOL)    One week prior to surgery, STOP taking any Aspirin (unless otherwise instructed by your surgeon) Aleve, Naproxen, Ibuprofen, Motrin, Advil, Goody's, BC's, all herbal medications, fish oil, and non-prescription vitamins.                     Do NOT Smoke (Tobacco/Vaping) for 24 hours prior to your procedure.  If you use a CPAP at night, you may bring your mask/headgear for your overnight stay.   You will be asked to remove any contacts, glasses, piercing's, hearing aid's, dentures/partials prior to surgery. Please bring cases for these items if needed.    Patients discharged the day of surgery will not be allowed to drive home, and someone needs to stay with them for 24 hours.  SURGICAL WAITING ROOM VISITATION Patients may have no more than 2 support people in the waiting area - these visitors may rotate.   Pre-op  nurse will coordinate an appropriate time for 1 ADULT support person, who may not rotate, to accompany patient in pre-op.  Children under the age of 41 must have an adult with them who is not the patient and must remain in the main waiting area with an adult.  If the patient needs to stay at the hospital during part of their recovery, the visitor guidelines for inpatient rooms apply.  Please refer to the [facility] website for the visitor guidelines for any additional information.   If you received a COVID test during your pre-op visit  it is requested that you wear a mask when out in public, stay away from anyone that may not be feeling well and notify your surgeon if you develop symptoms. If you have been in contact with anyone that has tested positive in the last 10 days please notify you surgeon.      Pre-operative CHG Bathing Instructions   You can play a key role in reducing the risk of infection after surgery. Your skin needs to be as free of germs as possible. You can reduce the number of germs on your skin by washing with CHG (chlorhexidine gluconate) soap before surgery. CHG is an antiseptic soap that kills germs and continues to kill germs even after washing.   DO NOT use if you have an allergy to chlorhexidine/CHG or antibacterial soaps. If your skin becomes reddened or irritated, stop using the CHG and notify one of our RNs at [phone].  TAKE A SHOWER THE NIGHT BEFORE SURGERY AND THE DAY OF SURGERY    Please keep in mind the following:  DO NOT shave, including legs and underarms, 48 hours prior to surgery.   You may shave your face before/day of surgery.  Place clean sheets on your bed the night before surgery Use a clean washcloth (not used since being washed) for each shower. DO NOT sleep with pet's night before surgery.  CHG Shower Instructions:  If you choose to wash your hair and private area, wash first with your normal shampoo/soap.  After you use  shampoo/soap, rinse your hair and body thoroughly to remove shampoo/soap residue.  Turn the water OFF and apply half the bottle of CHG soap to a CLEAN washcloth.  Apply CHG soap ONLY FROM YOUR NECK DOWN TO YOUR TOES (washing for 3-5 minutes)  DO NOT use CHG soap on face, private areas, open wounds, or sores.  Pay special attention to the area where your surgery is being performed.  If you are having back surgery, having someone wash your back for you may be helpful. Wait 2 minutes after CHG soap is applied, then you may rinse off the CHG soap.  Pat dry with a clean towel  Put on clean pajamas    Additional instructions for the day of surgery: DO NOT APPLY any lotions, deodorants, or perfumes.   Do not wear jewelry or makeup Do not wear nail polish, gel polish, artificial nails, or any other type of covering on natural nails (fingers and toes) Do not bring valuables to the hospital. [facility] is not responsible for valuables/personal belongings. Put on clean/comfortable clothes.  Please brush your teeth.  Ask your nurse before applying any prescription medications to the skin.

## 2022-08-31 ENCOUNTER — Other Ambulatory Visit: Payer: Self-pay

## 2022-08-31 ENCOUNTER — Encounter (HOSPITAL_COMMUNITY)
Admission: RE | Admit: 2022-08-31 | Discharge: 2022-08-31 | Disposition: A | Discharge status: Home / Self Care | Payer: Medicare Other | Source: Ambulatory Visit | Attending: Surgery | Admitting: Surgery

## 2022-08-31 ENCOUNTER — Encounter (HOSPITAL_COMMUNITY): Payer: Self-pay

## 2022-08-31 VITALS — BP 133/64 | HR 55 | Temp 97.5°F | Resp 18 | Ht 65.0 in | Wt 233.9 lb

## 2022-08-31 DIAGNOSIS — Z01818 Encounter for other preprocedural examination: Secondary | ICD-10-CM

## 2022-08-31 HISTORY — DX: Sleep apnea, unspecified: G47.30

## 2022-08-31 LAB — CBC
HCT: 42.3 % (ref 36.0–46.0)
Hemoglobin: 13.3 g/dL (ref 12.0–15.0)
MCH: 26.9 pg (ref 26.0–34.0)
MCHC: 31.4 g/dL (ref 30.0–36.0)
MCV: 85.6 fL (ref 80.0–100.0)
Platelets: 279 10*3/uL (ref 150–400)
RBC: 4.94 MIL/uL (ref 3.87–5.11)
RDW: 14.8 % (ref 11.5–15.5)
WBC: 10 10*3/uL (ref 4.0–10.5)
nRBC: 0 % (ref 0.0–0.2)

## 2022-08-31 LAB — BASIC METABOLIC PANEL
Anion gap: 7 (ref 5–15)
BUN: 14 mg/dL (ref 8–23)
CO2: 28 mmol/L (ref 22–32)
Calcium: 10.1 mg/dL (ref 8.9–10.3)
Chloride: 104 mmol/L (ref 98–111)
Creatinine, Ser: 0.78 mg/dL (ref 0.44–1.00)
GFR, Estimated: >60 mL/min (ref >60)
Glucose, Bld: 102 mg/dL — ABNORMAL HIGH (ref 70–99)
Potassium: 3.6 mmol/L (ref 3.5–5.1)
Sodium: 139 mmol/L (ref 135–145)

## 2022-08-31 NOTE — Progress Notes (Addendum)
PCP - Dr. Maurice Small Cardiologist - denies  PPM/ICD - denies Device Orders - n/a Rep Notified - n/a  Chest x-ray - n/a EKG - PAT, 08/31/2022 Stress Test -  ECHO -  Cardiac Cath -   Sleep Study - diagnosed with sleep apnea CPAP - wears CPAP nightly  Non-diabetic  Blood Thinner Instructions: denies Aspirin Instructions:denies  ERAS Protcol -Yes, clear fluids until 0830  COVID TEST- n/a   Anesthesia review: Yes.  HTN, sleep apnea with CPAP  Patient denies shortness of breath, fever, cough and chest pain at PAT appointment   All instructions explained to the patient, with a verbal understanding of the material. Patient agrees to go over the instructions while at home for a better understanding. Patient also instructed to self quarantine after being tested for COVID-19. The opportunity to ask questions was provided.

## 2022-09-08 ENCOUNTER — Ambulatory Visit (HOSPITAL_COMMUNITY)
Admission: RE | Admit: 2022-09-08 | Discharge: 2022-09-09 | Disposition: A | Discharge status: Home / Self Care | Payer: Medicare Other | Attending: Surgery | Admitting: Surgery

## 2022-09-08 ENCOUNTER — Encounter (HOSPITAL_COMMUNITY): Payer: Self-pay | Admitting: Surgery

## 2022-09-08 ENCOUNTER — Other Ambulatory Visit: Payer: Self-pay

## 2022-09-08 ENCOUNTER — Ambulatory Visit (HOSPITAL_COMMUNITY): Payer: Medicare Other | Admitting: Physician Assistant

## 2022-09-08 ENCOUNTER — Ambulatory Visit (HOSPITAL_BASED_OUTPATIENT_CLINIC_OR_DEPARTMENT_OTHER): Payer: Medicare Other | Admitting: Certified Registered Nurse Anesthetist

## 2022-09-08 ENCOUNTER — Encounter (HOSPITAL_COMMUNITY)
Admission: RE | Disposition: A | Discharge status: Home / Self Care | Payer: Self-pay | Source: Home / Self Care | Attending: Surgery

## 2022-09-08 DIAGNOSIS — K8010 Calculus of gallbladder with chronic cholecystitis without obstruction: Secondary | ICD-10-CM | POA: Insufficient documentation

## 2022-09-08 DIAGNOSIS — G4730 Sleep apnea, unspecified: Secondary | ICD-10-CM | POA: Insufficient documentation

## 2022-09-08 DIAGNOSIS — K660 Peritoneal adhesions (postprocedural) (postinfection): Secondary | ICD-10-CM | POA: Insufficient documentation

## 2022-09-08 DIAGNOSIS — I10 Essential (primary) hypertension: Secondary | ICD-10-CM | POA: Diagnosis not present

## 2022-09-08 DIAGNOSIS — K7689 Other specified diseases of liver: Secondary | ICD-10-CM | POA: Diagnosis present

## 2022-09-08 DIAGNOSIS — K8020 Calculus of gallbladder without cholecystitis without obstruction: Secondary | ICD-10-CM

## 2022-09-08 HISTORY — PX: CHOLECYSTECTOMY: SHX55

## 2022-09-08 SURGERY — LAPAROSCOPIC CHOLECYSTECTOMY
Anesthesia: General | Site: Abdomen

## 2022-09-08 MED ORDER — SERTRALINE HCL 100 MG PO TABS
100.0000 mg | ORAL_TABLET | Freq: Every day | ORAL | Status: DC
  Administered 2022-09-08: 100 mg via ORAL
  Filled 2022-09-08: qty 1

## 2022-09-08 MED ORDER — BUPIVACAINE-EPINEPHRINE 0.25% -1:200000 IJ SOLN
INTRAMUSCULAR | Status: DC | PRN
  Administered 2022-09-08: 30 mL

## 2022-09-08 MED ORDER — METHOCARBAMOL 500 MG PO TABS
500.0000 mg | ORAL_TABLET | Freq: Four times a day (QID) | ORAL | Status: DC
  Administered 2022-09-08: 500 mg via ORAL
  Filled 2022-09-08 (×2): qty 1

## 2022-09-08 MED ORDER — ACETAMINOPHEN 500 MG PO TABS
1000.0000 mg | ORAL_TABLET | Freq: Three times a day (TID) | ORAL | Status: DC
  Administered 2022-09-08 (×2): 1000 mg via ORAL
  Filled 2022-09-08 (×2): qty 2

## 2022-09-08 MED ORDER — ACETAMINOPHEN 325 MG PO TABS: 325.0000 mg | ORAL_TABLET | ORAL | Status: DC | PRN

## 2022-09-08 MED ORDER — 0.9 % SODIUM CHLORIDE (POUR BTL) OPTIME
TOPICAL | Status: DC | PRN
  Administered 2022-09-08: 1000 mL

## 2022-09-08 MED ORDER — ACETAMINOPHEN 500 MG PO TABS
1000.0000 mg | ORAL_TABLET | ORAL | Status: AC
  Administered 2022-09-08: 1000 mg via ORAL
  Filled 2022-09-08: qty 2

## 2022-09-08 MED ORDER — AMISULPRIDE (ANTIEMETIC) 5 MG/2ML IV SOLN: 10.0000 mg | Freq: Once | INTRAVENOUS | Status: DC | PRN

## 2022-09-08 MED ORDER — DEXAMETHASONE SODIUM PHOSPHATE 10 MG/ML IJ SOLN
INTRAMUSCULAR | Status: AC
  Filled 2022-09-08: qty 1

## 2022-09-08 MED ORDER — BUPIVACAINE-EPINEPHRINE (PF) 0.25% -1:200000 IJ SOLN
INTRAMUSCULAR | Status: AC
  Filled 2022-09-08: qty 30

## 2022-09-08 MED ORDER — LEVOTHYROXINE SODIUM 100 MCG PO TABS
100.0000 ug | ORAL_TABLET | Freq: Every day | ORAL | Status: DC
  Filled 2022-09-08: qty 1

## 2022-09-08 MED ORDER — TRAMADOL HCL 50 MG PO TABS
50.0000 mg | ORAL_TABLET | Freq: Four times a day (QID) | ORAL | Status: DC | PRN
  Administered 2022-09-08: 50 mg via ORAL
  Filled 2022-09-08: qty 1

## 2022-09-08 MED ORDER — ROCURONIUM BROMIDE 10 MG/ML (PF) SYRINGE
PREFILLED_SYRINGE | INTRAVENOUS | Status: DC | PRN
  Administered 2022-09-08: 20 mg via INTRAVENOUS
  Administered 2022-09-08: 60 mg via INTRAVENOUS

## 2022-09-08 MED ORDER — CEFAZOLIN SODIUM-DEXTROSE 2-4 GM/100ML-% IV SOLN
2.0000 g | INTRAVENOUS | Status: AC
  Administered 2022-09-08: 2 g via INTRAVENOUS
  Filled 2022-09-08: qty 100

## 2022-09-08 MED ORDER — PROPRANOLOL HCL 20 MG PO TABS: 20.0000 mg | ORAL_TABLET | Freq: Every day | ORAL | Status: DC

## 2022-09-08 MED ORDER — OXYCODONE HCL 5 MG PO TABS: 5.0000 mg | ORAL_TABLET | Freq: Once | ORAL | Status: DC | PRN

## 2022-09-08 MED ORDER — DOCUSATE SODIUM 100 MG PO CAPS: 100.0000 mg | ORAL_CAPSULE | Freq: Two times a day (BID) | ORAL | Status: AC

## 2022-09-08 MED ORDER — EPHEDRINE SULFATE-NACL 50-0.9 MG/10ML-% IV SOSY
PREFILLED_SYRINGE | INTRAVENOUS | Status: DC | PRN
  Administered 2022-09-08: 5 mg via INTRAVENOUS

## 2022-09-08 MED ORDER — ACETAMINOPHEN 10 MG/ML IV SOLN: 1000.0000 mg | Freq: Once | INTRAVENOUS | Status: DC | PRN

## 2022-09-08 MED ORDER — METHOCARBAMOL 500 MG PO TABS: 500.0000 mg | ORAL_TABLET | Freq: Three times a day (TID) | ORAL | 0 refills | Status: AC | PRN

## 2022-09-08 MED ORDER — CELECOXIB 200 MG PO CAPS
200.0000 mg | ORAL_CAPSULE | ORAL | Status: AC
  Administered 2022-09-08: 200 mg via ORAL
  Filled 2022-09-08: qty 1

## 2022-09-08 MED ORDER — AMLODIPINE BESYLATE 5 MG PO TABS: 5.0000 mg | ORAL_TABLET | Freq: Every day | ORAL | Status: DC

## 2022-09-08 MED ORDER — PROPOFOL 10 MG/ML IV BOLUS
INTRAVENOUS | Status: AC
  Filled 2022-09-08: qty 20

## 2022-09-08 MED ORDER — FENTANYL CITRATE (PF) 250 MCG/5ML IJ SOLN
INTRAMUSCULAR | Status: AC
  Filled 2022-09-08: qty 5

## 2022-09-08 MED ORDER — DEXAMETHASONE SODIUM PHOSPHATE 10 MG/ML IJ SOLN
INTRAMUSCULAR | Status: DC | PRN
  Administered 2022-09-08: 5 mg via INTRAVENOUS

## 2022-09-08 MED ORDER — DOCUSATE SODIUM 100 MG PO CAPS
100.0000 mg | ORAL_CAPSULE | Freq: Two times a day (BID) | ORAL | Status: DC
  Administered 2022-09-08: 100 mg via ORAL
  Filled 2022-09-08: qty 1

## 2022-09-08 MED ORDER — HYDROMORPHONE HCL 1 MG/ML IJ SOLN: 0.5000 mg | INTRAMUSCULAR | Status: DC | PRN

## 2022-09-08 MED ORDER — ONDANSETRON 4 MG PO TBDP: 4.0000 mg | ORAL_TABLET | Freq: Four times a day (QID) | ORAL | Status: DC | PRN

## 2022-09-08 MED ORDER — FENTANYL CITRATE (PF) 100 MCG/2ML IJ SOLN
INTRAMUSCULAR | Status: AC
  Filled 2022-09-08: qty 2

## 2022-09-08 MED ORDER — SODIUM CHLORIDE 0.9 % IR SOLN
Status: DC | PRN
  Administered 2022-09-08: 2

## 2022-09-08 MED ORDER — LACTATED RINGERS IV SOLN: INTRAVENOUS | Status: DC

## 2022-09-08 MED ORDER — ONDANSETRON HCL 4 MG/2ML IJ SOLN
4.0000 mg | Freq: Four times a day (QID) | INTRAMUSCULAR | Status: DC | PRN
  Administered 2022-09-08: 4 mg via INTRAVENOUS
  Filled 2022-09-08: qty 2

## 2022-09-08 MED ORDER — HYDROCHLOROTHIAZIDE 25 MG PO TABS: 25.0000 mg | ORAL_TABLET | Freq: Every day | ORAL | Status: DC

## 2022-09-08 MED ORDER — FENTANYL CITRATE (PF) 100 MCG/2ML IJ SOLN
25.0000 ug | INTRAMUSCULAR | Status: DC | PRN
  Administered 2022-09-08 (×2): 50 ug via INTRAVENOUS

## 2022-09-08 MED ORDER — FENTANYL CITRATE (PF) 250 MCG/5ML IJ SOLN
INTRAMUSCULAR | Status: DC | PRN
  Administered 2022-09-08 (×5): 50 ug via INTRAVENOUS

## 2022-09-08 MED ORDER — ORAL CARE MOUTH RINSE: 15.0000 mL | Freq: Once | OROMUCOSAL | Status: AC

## 2022-09-08 MED ORDER — ONDANSETRON HCL 4 MG/2ML IJ SOLN
INTRAMUSCULAR | Status: AC
  Filled 2022-09-08: qty 2

## 2022-09-08 MED ORDER — SIMVASTATIN 20 MG PO TABS
20.0000 mg | ORAL_TABLET | Freq: Every day | ORAL | Status: DC
  Administered 2022-09-08: 20 mg via ORAL
  Filled 2022-09-08: qty 1

## 2022-09-08 MED ORDER — DIPHENHYDRAMINE HCL 50 MG/ML IJ SOLN: 12.5000 mg | Freq: Four times a day (QID) | INTRAMUSCULAR | Status: DC | PRN

## 2022-09-08 MED ORDER — SUGAMMADEX SODIUM 200 MG/2ML IV SOLN
INTRAVENOUS | Status: DC | PRN
  Administered 2022-09-08: 200 mg via INTRAVENOUS

## 2022-09-08 MED ORDER — TRAMADOL HCL 50 MG PO TABS: 50.0000 mg | ORAL_TABLET | Freq: Four times a day (QID) | ORAL | 0 refills | Status: AC | PRN

## 2022-09-08 MED ORDER — DIPHENHYDRAMINE HCL 12.5 MG/5ML PO ELIX: 12.5000 mg | ORAL_SOLUTION | Freq: Four times a day (QID) | ORAL | Status: DC | PRN

## 2022-09-08 MED ORDER — ACETAMINOPHEN 160 MG/5ML PO SOLN: 325.0000 mg | ORAL | Status: DC | PRN

## 2022-09-08 MED ORDER — PROPOFOL 10 MG/ML IV BOLUS
INTRAVENOUS | Status: DC | PRN
Start: 2022-09-08 — End: 2022-09-08
  Administered 2022-09-08: 140 mg via INTRAVENOUS

## 2022-09-08 MED ORDER — CHLORHEXIDINE GLUCONATE 0.12 % MT SOLN
15.0000 mL | Freq: Once | OROMUCOSAL | Status: AC
  Administered 2022-09-08: 15 mL via OROMUCOSAL
  Filled 2022-09-08: qty 15

## 2022-09-08 MED ORDER — POLYVINYL ALCOHOL 1.4 % OP SOLN
Freq: Every day | OPHTHALMIC | Status: DC
  Filled 2022-09-08: qty 15

## 2022-09-08 MED ORDER — PROMETHAZINE HCL 25 MG/ML IJ SOLN: 6.2500 mg | INTRAMUSCULAR | Status: DC | PRN

## 2022-09-08 MED ORDER — MELATONIN 3 MG PO TABS: 3.0000 mg | ORAL_TABLET | Freq: Every evening | ORAL | Status: DC | PRN

## 2022-09-08 MED ORDER — ENOXAPARIN SODIUM 40 MG/0.4ML IJ SOSY: 40.0000 mg | PREFILLED_SYRINGE | INTRAMUSCULAR | Status: DC

## 2022-09-08 MED ORDER — OXYCODONE HCL 5 MG/5ML PO SOLN: 5.0000 mg | Freq: Once | ORAL | Status: DC | PRN

## 2022-09-08 MED ORDER — ONDANSETRON HCL 4 MG/2ML IJ SOLN
INTRAMUSCULAR | Status: DC | PRN
  Administered 2022-09-08: 4 mg via INTRAVENOUS

## 2022-09-08 MED ORDER — LIDOCAINE 2% (20 MG/ML) 5 ML SYRINGE
INTRAMUSCULAR | Status: DC | PRN
  Administered 2022-09-08: 40 mg via INTRAVENOUS

## 2022-09-08 SURGICAL SUPPLY — 48 items
ADH SKN CLS APL DERMABOND .7 (GAUZE/BANDAGES/DRESSINGS) ×1
APL PRP STRL LF DISP 70% ISPRP (MISCELLANEOUS) ×1
APPLIER CLIP 5 13 M/L LIGAMAX5 (MISCELLANEOUS) ×1
APR CLP MED LRG 5 ANG JAW (MISCELLANEOUS) ×1
BAG COUNTER SPONGE SURGICOUNT (BAG) ×1 IMPLANT
BAG SPEC RTRVL 10 TROC 200 (ENDOMECHANICALS) ×1
BAG SPNG CNTER NS LX DISP (BAG)
BLADE CLIPPER SURG (BLADE) IMPLANT
CANISTER SUCT 3000ML PPV (MISCELLANEOUS) ×1 IMPLANT
CHLORAPREP W/TINT 26 (MISCELLANEOUS) ×1 IMPLANT
CLIP APPLIE 5 13 M/L LIGAMAX5 (MISCELLANEOUS) ×1 IMPLANT
COVER SURGICAL LIGHT HANDLE (MISCELLANEOUS) ×1 IMPLANT
DERMABOND ADVANCED .7 DNX12 (GAUZE/BANDAGES/DRESSINGS) ×1 IMPLANT
ELECT REM PT RETURN 9FT ADLT (ELECTROSURGICAL) ×1
ELECTRODE REM PT RTRN 9FT ADLT (ELECTROSURGICAL) ×1 IMPLANT
GLOVE BIOGEL PI IND STRL 6 (GLOVE) ×1 IMPLANT
GLOVE BIOGEL PI MICRO STRL 5.5 (GLOVE) ×1 IMPLANT
GOWN STRL REUS W/ TWL LRG LVL3 (GOWN DISPOSABLE) ×3 IMPLANT
GOWN STRL REUS W/TWL LRG LVL3 (GOWN DISPOSABLE) ×3
IRRIG SUCT STRYKERFLOW 2 WTIP (MISCELLANEOUS) ×1
IRRIGATION SUCT STRKRFLW 2 WTP (MISCELLANEOUS) ×1 IMPLANT
KIT BASIN OR (CUSTOM PROCEDURE TRAY) ×1 IMPLANT
KIT TURNOVER KIT B (KITS) ×1 IMPLANT
L-HOOK LAP DISP 36CM (ELECTROSURGICAL) ×1
LHOOK LAP DISP 36CM (ELECTROSURGICAL) ×1 IMPLANT
NDL INSUFFLATION 14GA 120MM (NEEDLE) IMPLANT
NEEDLE INSUFFLATION 14GA 120MM (NEEDLE) ×1
NS IRRIG 1000ML POUR BTL (IV SOLUTION) ×1 IMPLANT
PAD ARMBOARD 7.5X6 YLW CONV (MISCELLANEOUS) ×1 IMPLANT
PENCIL BUTTON HOLSTER BLD 10FT (ELECTRODE) ×1 IMPLANT
POUCH RETRIEVAL ECOSAC 10 (ENDOMECHANICALS) IMPLANT
SCISSORS LAP 5X35 DISP (ENDOMECHANICALS) ×1 IMPLANT
SEALER BIPOLAR AQUA END DBS8.7 (INSTRUMENTS) IMPLANT
SET TUBE SMOKE EVAC HIGH FLOW (TUBING) ×1 IMPLANT
SHEARS HARMONIC ACE PLUS 36CM (ENDOMECHANICALS) IMPLANT
SLEEVE Z-THREAD 5X100MM (TROCAR) ×2 IMPLANT
SUT MNCRL AB 4-0 PS2 18 (SUTURE) ×1 IMPLANT
SUT VICRYL 0 AB UR-6 (SUTURE) IMPLANT
SYS BAG RETRIEVAL 10MM (BASKET)
SYSTEM BAG RETRIEVAL 10MM (BASKET) IMPLANT
TOWEL GREEN STERILE (TOWEL DISPOSABLE) ×1 IMPLANT
TOWEL GREEN STERILE FF (TOWEL DISPOSABLE) ×1 IMPLANT
TRAY LAPAROSCOPIC MC (CUSTOM PROCEDURE TRAY) ×1 IMPLANT
TROCAR BALLN 12MMX100 BLUNT (TROCAR) ×1 IMPLANT
TROCAR Z THREAD OPTICAL 12X100 (TROCAR) IMPLANT
TROCAR Z-THREAD OPTICAL 5X100M (TROCAR) ×1 IMPLANT
WARMER LAPAROSCOPE (MISCELLANEOUS) ×1 IMPLANT
WATER STERILE IRR 1000ML POUR (IV SOLUTION) ×1 IMPLANT

## 2022-09-08 NOTE — Plan of Care (Signed)

## 2022-09-08 NOTE — Transfer of Care (Signed)
Immediate Anesthesia Transfer of Care Note  Patient: Meredith Rose  Procedure(s) Performed: LAPAROSCOPIC CHOLECYSTECTOMY; FENESTRATION OF HEPATIC CYST (Abdomen)  Patient Location: PACU  Anesthesia Type:General  Level of Consciousness: drowsy and patient cooperative  Airway & Oxygen Therapy: Patient Spontanous Breathing and Patient connected to face mask oxygen  Post-op Assessment: Report given to RN, Post -op Vital signs reviewed and stable, and Patient moving all extremities X 4  Post vital signs: Reviewed and stable  Last Vitals:  Vitals Value Taken Time  BP 126/59 09/08/22 1250  Temp    Pulse 57 09/08/22 1252  Resp 17 09/08/22 1252  SpO2 92 % 09/08/22 1252  Vitals shown include unfiled device data.  Last Pain:  Vitals:   09/08/22 1250  TempSrc:   PainSc: Asleep         Complications: No notable events documented.

## 2022-09-08 NOTE — Anesthesia Procedure Notes (Signed)
Procedure Name: Intubation Date/Time: 09/08/2022 10:36 AM  Performed by: Waynard Edwards, CRNAPre-anesthesia Checklist: Patient identified, Emergency Drugs available, Suction available and Patient being monitored Patient Re-evaluated:Patient Re-evaluated prior to induction Oxygen Delivery Method: Circle system utilized Preoxygenation: Pre-oxygenation with 100% oxygen Induction Type: IV induction Ventilation: Mask ventilation without difficulty Laryngoscope Size: Miller and 2 Grade View: Grade I Tube type: Oral Tube size: 7.0 mm Number of attempts: 1 Airway Equipment and Method: Stylet Placement Confirmation: ETT inserted through vocal cords under direct vision, positive ETCO2 and breath sounds checked- equal and bilateral Secured at: 22 cm Tube secured with: Tape Dental Injury: Teeth and Oropharynx as per pre-operative assessment

## 2022-09-08 NOTE — Discharge Instructions (Addendum)
[facility] DISCHARGE INSTRUCTIONS  Activity No heavy lifting greater than 15 pounds for 4 weeks after surgery. Ok to shower in 24 hours, but do not bathe or submerge incisions underwater. Do not drive while taking narcotic pain medication. You may drive when you are no longer taking prescription pain medication, you can comfortably wear a seatbelt, and you can safely maneuver your car and apply brakes.  Wound Care Your incisions are covered with skin glue called Dermabond. This will peel off on its own over time. You may shower and allow warm soapy water to run over your incisions. Gently pat dry. Do not submerge your incision underwater until cleared by your surgeon. Monitor your incision for any new redness, tenderness, or drainage. Many patients will experience some swelling and bruising at the incisions.  Ice packs will help.  Swelling and bruising can take several days to resolve.   Medications A  prescription for pain medication may be given to you upon discharge.  Take your pain medication as prescribed, if needed.  If narcotic pain medicine is not needed, then you may take acetaminophen (Tylenol) or ibuprofen (Advil) as needed. It is common to experience some constipation if taking pain medication after surgery.  Increasing fluid intake and taking a stool softener (such as Colace) will usually help or prevent this problem from occurring.  A mild laxative (Milk of Magnesia or Miralax) should be taken according to package directions if there are no bowel movements after 48 hours. Take your usually prescribed medications unless otherwise directed. If you need a refill on your pain medication, please contact your pharmacy.  They will contact our office to request authorization. Prescriptions will not be filled after 5 pm or on weekends.  When to Call us: Fever greater than 100.5 New redness, drainage, or swelling at incision site Severe pain, nausea, or  vomiting Persistent bleeding from incisions Jaundice (yellowing of the whites of the eyes or skin)  Follow-up You have an appointment scheduled with Dr. Freida Busman on September 26, 2022 at 10:50am. This will be at the [facility] office at [address], [address], [place], [place]. Please arrive at least 15 minutes prior to your scheduled appointment time.  IF YOU HAVE DISABILITY OR FAMILY LEAVE FORMS, YOU MUST BRING THEM TO THE OFFICE FOR PROCESSING.   DO NOT GIVE THEM TO YOUR DOCTOR.  The clinic staff is available to answer your questions during regular business hours.  Please don't hesitate to call and ask to speak to one of the nurses for clinical concerns.  If you have a medical emergency, go to the nearest emergency room or call 911.  A surgeon from [facility] is always on call at the hospital  [address], [place], [place]  [address] ?  [address] [address], [place], [place]   [address] [phone] ? Toll Free: [phone] ? FAX [phone] Web site: [email or web address]      Managing Your Pain After Surgery Without Opioids    Thank you for participating in our program to help patients manage their pain after surgery without opioids. This is part of our effort to provide you with the best care possible, without exposing you or your family to the risk that opioids pose.  What pain can I expect after surgery? You can expect to have some pain after surgery. This is normal. The pain is typically worse the day after surgery, and quickly begins to get better. Many studies have  found that many patients are able to manage their pain after surgery with Over-the-Counter (OTC) medications such as Tylenol and Motrin. If you have a condition that does not allow you to take Tylenol or Motrin, notify your surgical team.  How will I manage my pain? The best strategy for controlling your pain after surgery is around the clock pain control with  Tylenol (acetaminophen) and Motrin (ibuprofen or Advil). Alternating these medications with each other allows you to maximize your pain control. In addition to Tylenol and Motrin, you can use heating pads or ice packs on your incisions to help reduce your pain.  How will I alternate your regular strength over-the-counter pain medication? You will take a dose of pain medication every three hours. Start by taking 650 mg of Tylenol (2 pills of 325 mg) 3 hours later take 600 mg of Motrin (3 pills of 200 mg) 3 hours after taking the Motrin take 650 mg of Tylenol 3 hours after that take 600 mg of Motrin.   - 1 -  See example - if your first dose of Tylenol is at 12:00 PM   12:00 PM Tylenol 650 mg (2 pills of 325 mg)  3:00 PM Motrin 600 mg (3 pills of 200 mg)  6:00 PM Tylenol 650 mg (2 pills of 325 mg)  9:00 PM Motrin 600 mg (3 pills of 200 mg)  Continue alternating every 3 hours   We recommend that you follow this schedule around-the-clock for at least 3 days after surgery, or until you feel that it is no longer needed. Use the table on the last page of this handout to keep track of the medications you are taking. Important: Do not take more than 3000mg  of Tylenol or 3200mg  of Motrin in a 24-hour period. Do not take ibuprofen/Motrin if you have a history of bleeding stomach ulcers, severe kidney disease, &/or actively taking a blood thinner  What if I still have pain? If you have pain that is not controlled with the over-the-counter pain medications (Tylenol and Motrin or Advil) you might have what we call "breakthrough" pain. You will receive a prescription for a small amount of an opioid pain medication such as Oxycodone, Tramadol, or Tylenol with Codeine. Use these opioid pills in the first 24 hours after surgery if you have breakthrough pain. Do not take more than 1 pill every 4-6 hours.  If you still have uncontrolled pain after using all opioid pills, don't hesitate to call our staff  using the number provided. We will help make sure you are managing your pain in the best way possible, and if necessary, we can provide a prescription for additional pain medication.   Day 1    Time  Name of Medication Number of pills taken  Amount of Acetaminophen  Pain Level   Comments  AM PM       AM PM       AM PM       AM PM       AM PM       AM PM       AM PM       AM PM       Total Daily amount of Acetaminophen Do not take more than  3,000 mg per day      Day 2    Time  Name of Medication Number of pills taken  Amount of Acetaminophen  Pain Level   Comments  AM PM  AM PM       AM PM       AM PM       AM PM       AM PM       AM PM       AM PM       Total Daily amount of Acetaminophen Do not take more than  3,000 mg per day      Day 3    Time  Name of Medication Number of pills taken  Amount of Acetaminophen  Pain Level   Comments  AM PM       AM PM       AM PM       AM PM         AM PM       AM PM       AM PM       AM PM       Total Daily amount of Acetaminophen Do not take more than  3,000 mg per day      Day 4    Time  Name of Medication Number of pills taken  Amount of Acetaminophen  Pain Level   Comments  AM PM       AM PM       AM PM       AM PM       AM PM       AM PM       AM PM       AM PM       Total Daily amount of Acetaminophen Do not take more than  3,000 mg per day      Day 5    Time  Name of Medication Number of pills taken  Amount of Acetaminophen  Pain Level   Comments  AM PM       AM PM       AM PM       AM PM       AM PM       AM PM       AM PM       AM PM       Total Daily amount of Acetaminophen Do not take more than  3,000 mg per day      Day 6    Time  Name of Medication Number of pills taken  Amount of Acetaminophen  Pain Level  Comments  AM PM       AM PM       AM PM       AM PM       AM PM       AM PM       AM PM       AM PM       Total Daily amount of  Acetaminophen Do not take more than  3,000 mg per day      Day 7    Time  Name of Medication Number of pills taken  Amount of Acetaminophen  Pain Level   Comments  AM PM       AM PM       AM PM       AM PM       AM PM       AM PM       AM PM       AM PM  Total Daily amount of Acetaminophen Do not take more than  3,000 mg per day        For additional information about how and where to safely dispose of unused opioid medications - [email or web address]  Disclaimer: This document contains information and/or instructional materials adapted from [facility] for the typical patient with your condition. It does not replace medical advice from your health care provider because your experience may differ from that of the typical patient. Talk to your health care provider if you have any questions about this document, your condition or your treatment plan. Adapted from [facility]

## 2022-09-08 NOTE — Anesthesia Preprocedure Evaluation (Signed)
Anesthesia Evaluation  Patient identified by MRN, date of birth, ID band Patient awake    Reviewed: Allergy & Precautions, NPO status , Patient's Chart, lab work & pertinent test results  Airway Mallampati: III  TM Distance: >3 FB Neck ROM: Full    Dental  (+) Teeth Intact   Pulmonary sleep apnea and Continuous Positive Airway Pressure Ventilation    breath sounds clear to auscultation       Cardiovascular hypertension, Pt. on medications  Rhythm:Regular Rate:Normal     Neuro/Psych   Anxiety     negative neurological ROS     GI/Hepatic negative GI ROS, Neg liver ROS,,,  Endo/Other  Hypothyroidism    Renal/GU negative Renal ROS     Musculoskeletal  (+) Arthritis ,    Abdominal   Peds  Hematology negative hematology ROS (+)   Anesthesia Other Findings   Reproductive/Obstetrics                             Anesthesia Physical Anesthesia Plan  ASA: 2  Anesthesia Plan: General   Post-op Pain Management: Tylenol PO (pre-op)*   Induction: Intravenous  PONV Risk Score and Plan: 4 or greater and Ondansetron and Dexamethasone  Airway Management Planned: Oral ETT  Additional Equipment: None  Intra-op Plan:   Post-operative Plan: Extubation in OR  Informed Consent: I have reviewed the patients History and Physical, chart, labs and discussed the procedure including the risks, benefits and alternatives for the proposed anesthesia with the patient or authorized representative who has indicated his/her understanding and acceptance.     Dental advisory given  Plan Discussed with: CRNA  Anesthesia Plan Comments:        Anesthesia Quick Evaluation

## 2022-09-08 NOTE — Interval H&P Note (Signed)
History and Physical Interval Note:  09/08/2022 9:50 AM  Meredith Rose  has presented today for surgery, with the diagnosis of LIVER CYST, GALLSTONES.  The various methods of treatment have been discussed with the patient and family. After consideration of risks, benefits and other options for treatment, the patient has consented to  Procedure(s): LAPAROSCOPIC CHOLECYSTECTOMY; FENESTRATION OF HEPATIC CYST (N/A) as a surgical intervention.  The patient's history has been reviewed, patient examined, no change in status, stable for surgery.  I have reviewed the patient's chart and labs.  Questions were answered to the patient's satisfaction. Will likely observe overnight postoperatively.   Fritzi Mandes

## 2022-09-08 NOTE — Anesthesia Postprocedure Evaluation (Signed)
Anesthesia Post Note  Patient: Meredith Rose  Procedure(s) Performed: LAPAROSCOPIC CHOLECYSTECTOMY; FENESTRATION OF HEPATIC CYST (Abdomen)     Patient location during evaluation: PACU Anesthesia Type: General Level of consciousness: awake and alert Pain management: pain level controlled Vital Signs Assessment: post-procedure vital signs reviewed and stable Respiratory status: spontaneous breathing, nonlabored ventilation, respiratory function stable and patient connected to nasal cannula oxygen Cardiovascular status: blood pressure returned to baseline and stable Postop Assessment: no apparent nausea or vomiting Anesthetic complications: no  No notable events documented.  Last Vitals:  Vitals:   09/08/22 1400 09/08/22 1534  BP: 128/69 (!) 112/59  Pulse: (!) 57 62  Resp: 16 16  Temp: 36.4 C 36.4 C  SpO2: 92% 95%    Last Pain:  Vitals:   09/08/22 1545  TempSrc:   PainSc: 2                  Shelton Silvas

## 2022-09-08 NOTE — Op Note (Signed)
Date: 09/08/22  Patient: Meredith Rose MRN: [id number]  Preoperative Diagnosis: Hepatic cyst, cholelithiasis Postoperative Diagnosis: Same  Procedure:  Laparoscopic lysis of adhesions Fenestration of hepatic cyst Cholecystectomy  Surgeon: Sophronia Simas, MD  EBL: 50 mL  Anesthesia: General endotracheal  Specimens:  Gallbladder Liver cyst  Indications: Ms. Crumrine is a 72 yo female who presented with RUQ fullness and pressure. She has also had postprandial RUQ pain in the evenings. She has a known liver cyst since at least 2013, and recent US showed enlargement since that time to 17cm. She also has cholelithiasis. After a discussion of the risks and benefits of surgery, she agreed to proceed with cholecystectomy.  Findings: Extensive omental adhesions to anterior abdominal wall starting at the lower midline and extending into the upper abdomen, requiring adhesiolysis for about 30 minutes. Large simple cyst in the right hepatic lobe containing approximately 2L clear fluid. Cholelithiasis without signs of acute cholecystitis.  Procedure details: Informed consent was obtained in the preoperative area prior to the procedure. The patient was brought to the operating room and placed on the table in the supine position. General anesthesia was induced and appropriate lines and drains were placed for intraoperative monitoring. Perioperative antibiotics were administered per SCIP guidelines. The abdomen was prepped and draped in the usual sterile fashion. A pre-procedure timeout was taken verifying patient identity, surgical site and procedure to be performed.  A small skin incision was made in the left upper quadrant in Palmer's point, the fascia was grasped and elevated, and a Veress needle was inserted.  Intraperitoneal placement was confirmed with a saline drop test and the abdomen was insufflated.  A 5 mm Visiport was placed.  There were extensive omental adhesions to the entire midline,  preventing visualization of the right side of the abdomen.  An additional 5 mm port was placed in the left lower quadrant.  The upper abdominal wall was cleared of adhesions using sharp dissection and gentle blunt dissection.  There was small bowel adherent to the midline below the umbilicus, and this was mostly left in place.  Once the periumbilical area was clear of adhesions, a 5 mm supraumbilical port was placed through a previous incisional hernia.  An additional 5 mm port was placed in the right mid costal margin.  The remainder of the omental adhesions were taken down from the right upper quadrant abdominal wall using sharp and blunt dissection.  The supraumbilical port was then upsized to a 12 mm port, and an additional 5mm port was placed in the right lateral subcostal margin.  The omentum was swept down off the liver to expose a very large cyst emanating from the right lateral liver.  The cyst wall was opened with harmonic shears and approximately 2 L of clear light Hofmann fluid was evacuated, consistent with serous fluid and old blood.  The cyst wall was adherent to the adjacent omentum.  The omentum was bluntly swept down off the cyst wall, using harmonic shears to lyse thicker adhesions to the cyst wall.  The hepatic flexure of the colon was partially adherent to the cyst and was carefully separated using sharp and blunt dissection.  The cyst was then bluntly separated from the retroperitoneal tissues.  Once the cyst wall was mobilized, Harmonic shears were used to unroofed the cyst and excise a large portion of the cyst wall.  Care was taken not to enter the liver parenchyma.  Once the cyst had been completely unroofed, the wall was set aside to be  extracted later with the gallbladder.  Next the fundus of the gallbladder was grasped and retracted cephalad.  There were omental adhesions to the infundibulum of the gallbladder which were taken down bluntly.  The infundibulum of the gallbladder was then  grasped and retracted laterally, and the peritoneum overlying the gallbladder was incised with cautery.  The cystic triangle was dissected out using cautery and blunt dissection, and the critical view of safety was obtained.  The cystic artery was divided with harmonic shears.  The cystic duct was further dissected out circumferentially and ligated with clips.  The cystic duct was then divided sharply, leaving 2 clips behind on the cystic duct stump.  The gallbladder was then taken off the liver using cautery.  The gallbladder and the hepatic cyst wall were then placed in an Ecosac.    Hemostasis was achieved in the gallbladder fossa with cautery.  The remaining lining of the hepatic cyst was ablated using Aquamantys.  The cystic duct and cystic artery stumps were inspected and had no evidence of bile leak or bleeding.  The abdomen was irrigated and appeared hemostatic.  The colon was examined closely and had no signs of injury.  The ports were removed under direct visualization and the abdomen was desufflated.  The specimens were extracted via the umbilical port site and sent for routine pathology.  The umbilical port site fascia was closed with 0 Vicryl figure-of-eight sutures.  The skin at all port sites was closed with subcuticular 4 Monocryl suture.  Dermabond was applied.  The patient tolerated the procedure well with no apparent complications.  All counts were correct x2 at the end of the procedure. The patient was extubated and taken to PACU in stable condition.  Sophronia Simas, MD 09/08/22 12:44 PM

## 2022-09-09 ENCOUNTER — Encounter (HOSPITAL_COMMUNITY): Payer: Self-pay | Admitting: Surgery

## 2022-09-09 DIAGNOSIS — K7689 Other specified diseases of liver: Secondary | ICD-10-CM | POA: Diagnosis not present

## 2022-09-09 NOTE — Progress Notes (Signed)
No acute issues. Hgb stable. Pain adequately controlled, some soreness. Tolerating diet. Discharge home today. Instructions reviewed and all questions answered.

## 2022-09-09 NOTE — Plan of Care (Signed)

## 2023-10-31 ENCOUNTER — Other Ambulatory Visit: Payer: Self-pay | Admitting: Orthopaedic Surgery

## 2023-11-08 NOTE — Patient Instructions (Addendum)
 SURGICAL WAITING ROOM VISITATION  Patients having surgery or a procedure may have no more than 2 support people in the waiting area - these visitors may rotate.    Children under the age of 56 must have an adult with them who is not the patient.  Visitors with respiratory illnesses are discouraged from visiting and should remain at home.  If the patient needs to stay at the hospital during part of their recovery, the visitor guidelines for inpatient rooms apply. Pre-op nurse will coordinate an appropriate time for 1 support person to accompany patient in pre-op.  This support person may not rotate.    Please refer to the [facility] website for the visitor guidelines for Inpatients (after your surgery is over and you are in a regular room).       Your procedure is scheduled on: 11/13/23   Report to [facility] Main Entrance    Report to admitting at 5:15 AM   Call this number if you have problems the morning of surgery [phone]   Do not eat food :After Midnight.   After Midnight you may have the following liquids until 4:25 AM DAY OF SURGERY  Water  Non-Citrus Juices (without pulp, NO RED-Apple, White grape, White cranberry) Black Coffee (NO MILK/CREAM OR CREAMERS, sugar ok)  Clear Tea (NO MILK/CREAM OR CREAMERS, sugar ok) regular and decaf                             Plain Jell-O (NO RED)                                           Fruit ices (not with fruit pulp, NO RED)                                     Popsicles (NO RED)                                                               Sports drinks like Gatorade (NO RED)                   The day of surgery:  Drink ONE (1) Pre-Surgery Clear Ensure at 4:25  AM the morning of surgery. Drink in one sitting. Do not sip.  This drink was given to you during your hospital  pre-op appointment visit. Nothing else to drink after completing the  Pre-Surgery Clear Ensure.       Oral Hygiene is also important to reduce  your risk of infection.                                    Remember - BRUSH YOUR TEETH THE MORNING OF SURGERY WITH YOUR REGULAR TOOTHPASTE  DENTURES WILL BE REMOVED PRIOR TO SURGERY PLEASE DO NOT APPLY Poly grip OR ADHESIVES!!!    Stop all vitamins and herbal supplements 7 days before surgery.   Take these medicines the morning of surgery with A SIP OF  WATER : Tylenol , amlodipine , levothyroxine , propanolol, zoloft , simvastatin   Bring CPAP mask and tubing day of surgery.                              You may not have any metal on your body including hair pins, jewelry, and body piercing             Do not wear make-up, lotions, powders, perfumes/cologne, or deodorant  Do not wear nail polish including gel and S&S, artificial/acrylic nails, or any other type of covering on natural nails including finger and toenails. If you have artificial nails, gel coating, etc. that needs to be removed by a nail salon please have this removed prior to surgery or surgery may need to be canceled/ delayed if the surgeon/ anesthesia feels like they are unable to be safely monitored.   Do not shave  48 hours prior to surgery.               Do not bring valuables to the hospital. [facility] IS NOT             RESPONSIBLE   FOR VALUABLES.   Contacts, glasses, dentures or bridgework may not be worn into surgery.    DO NOT BRING YOUR HOME MEDICATIONS TO THE HOSPITAL. PHARMACY WILL DISPENSE MEDICATIONS LISTED ON YOUR MEDICATION LIST TO YOU DURING YOUR ADMISSION IN THE HOSPITAL!    Patients discharged on the day of surgery will not be allowed to drive home.  Someone NEEDS to stay with you for the first 24 hours after anesthesia.   Special Instructions: Bring a copy of your healthcare power of attorney and living will documents the day of surgery if you haven't scanned them before.              Please read over the following fact sheets you were given: IF YOU HAVE QUESTIONS ABOUT YOUR PRE-OP INSTRUCTIONS  PLEASE CALL [phone] [place]  If you received a COVID test during your pre-op visit  it is requested that you wear a mask when out in public, stay away from anyone that may not be feeling well and notify your surgeon if you develop symptoms. If you test positive for Covid or have been in contact with anyone that has tested positive in the last 10 days please notify you surgeon.    [facility] - Preparing for Surgery Before surgery, you can play an important role.  Because skin is not sterile, your skin needs to be as free of germs as possible.  You can reduce the number of germs on your skin by washing with CHG (chlorahexidine gluconate) soap before surgery.  CHG is an antiseptic cleaner which kills germs and bonds with the skin to continue killing germs even after washing. Please DO NOT use if you have an allergy to CHG or antibacterial soaps.  If your skin becomes reddened/irritated stop using the CHG and inform your nurse when you arrive at Short Stay. Do not shave (including legs and underarms) for at least 48 hours prior to the first CHG shower.  You may shave your face/neck.  Please follow these instructions carefully:  1.  Shower with CHG Soap the night before surgery ONLY (DO NOT USE THE SOAP THE MORNING OF SURGERY).  2.  If you choose to wash your hair, wash your hair first as usual with your normal  shampoo.  3.  After you shampoo, rinse your hair and  body thoroughly to remove the shampoo.                             4.  Use CHG as you would any other liquid soap.  You can apply chg directly to the skin and wash.  Gently with a scrungie or clean washcloth.  5.  Apply the CHG Soap to your body ONLY FROM THE NECK DOWN.   Do   not use on face/ open                           Wound or open sores. Avoid contact with eyes, ears mouth and   genitals (private parts).                       Wash face,  Genitals (private parts) with your normal soap.             6.  Wash thoroughly, paying special  attention to the area where your    surgery  will be performed.  7.  Thoroughly rinse your body with warm water  from the neck down.  8.  DO NOT shower/wash with your normal soap after using and rinsing off the CHG Soap.                9.  Pat yourself dry with a clean towel.            10.  Wear clean pajamas.            11.  Place clean sheets on your bed the night of your first shower and do not  sleep with pets. Day of Surgery : Do not apply any CHG, lotions/deodorants the morning of surgery.  Please wear clean clothes to the hospital/surgery center.  FAILURE TO FOLLOW THESE INSTRUCTIONS MAY RESULT IN THE CANCELLATION OF YOUR SURGERY  PATIENT SIGNATURE_________________________________  NURSE SIGNATURE__________________________________  Incentive Spirometer  An incentive spirometer is a tool that can help keep your lungs clear and active. This tool measures how well you are filling your lungs with each breath. Taking long deep breaths may help reverse or decrease the chance of developing breathing (pulmonary) problems (especially infection) following: A long period of time when you are unable to move or be active. BEFORE THE PROCEDURE  If the spirometer includes an indicator to show your best effort, your nurse or respiratory therapist will set it to a desired goal. If possible, sit up straight or lean slightly forward. Try not to slouch. Hold the incentive spirometer in an upright position. INSTRUCTIONS FOR USE  Sit on the edge of your bed if possible, or sit up as far as you can in bed or on a chair. Hold the incentive spirometer in an upright position. Breathe out normally. Place the mouthpiece in your mouth and seal your lips tightly around it. Breathe in slowly and as deeply as possible, raising the piston or the ball toward the top of the column. Hold your breath for 3-5 seconds or for as long as possible. Allow the piston or ball to fall to the bottom of the column. Remove the  mouthpiece from your mouth and breathe out normally. Rest for a few seconds and repeat Steps 1 through 7 at least 10 times every 1-2 hours when you are awake. Take your time and take a few normal breaths between deep breaths. The spirometer may  include an indicator to show your best effort. Use the indicator as a goal to work toward during each repetition. After each set of 10 deep breaths, practice coughing to be sure your lungs are clear. If you have an incision (the cut made at the time of surgery), support your incision when coughing by placing a pillow or rolled up towels firmly against it. Once you are able to get out of bed, walk around indoors and cough well. You may stop using the incentive spirometer when instructed by your caregiver.  RISKS AND COMPLICATIONS Take your time so you do not get dizzy or light-headed. If you are in pain, you may need to take or ask for pain medication before doing incentive spirometry. It is harder to take a deep breath if you are having pain. AFTER USE Rest and breathe slowly and easily. It can be helpful to keep track of a log of your progress. Your caregiver can provide you with a simple table to help with this. If you are using the spirometer at home, follow these instructions: SEEK MEDICAL CARE IF:  You are having difficultly using the spirometer. You have trouble using the spirometer as often as instructed. Your pain medication is not giving enough relief while using the spirometer. You develop fever of 100.5 F (38.1 C) or higher. SEEK IMMEDIATE MEDICAL CARE IF:  You cough up bloody sputum that had not been present before. You develop fever of 102 F (38.9 C) or greater. You develop worsening pain at or near the incision site. MAKE SURE YOU:  Understand these instructions. Will watch your condition. Will get help right away if you are not doing well or get worse. Document Released: 05/29/2006 Document Revised: 04/10/2011 Document Reviewed:  07/30/2006 [facility] Patient Information 2014 [place], [place].

## 2023-11-08 NOTE — Progress Notes (Signed)
 COVID Vaccine received:  []  No [x]  Yes Date of any COVID positive Test in last 90 days: no PCP - Dr. Vernon @ [facility] Cardiologist - N/A  Chest x-ray -  EKG -   Stress Test -  ECHO -  Cardiac Cath -   Bowel Prep - [x]  No  []   Yes ______  Pacemaker / ICD device [x]  No []  Yes   Spinal Cord Stimulator:[x]  No []  Yes       History of Sleep Apnea? []  No [x]  Yes   CPAP used?- []  No [x]  Yes    Does the patient monitor blood sugar?          [x]  No []  Yes  []  N/A  Patient has: [x]  NO Hx DM   []  Pre-DM                 []  DM1  []   DM2 Does patient have a [employer] or Dexacom? []  No []  Yes   Fasting Blood Sugar Ranges-  Checks Blood Sugar _____ times a day  GLP1 agonist / usual dose -no  GLP1 instructions:  SGLT-2 inhibitors / usual dose - no SGLT-2 instructions:   Blood Thinner / Instructions:no Aspirin  Instructions:no  Comments:   Activity level: Patient is able to climb a flight of stairs without difficulty; [x]  No CP  [x]  No SOB,    Patient canperform ADLs without assistance.   Anesthesia review: HTN, Murmur-no ECHO  Patient denies shortness of breath, fever, cough and chest pain at PAT appointment.  Patient verbalized understanding and agreement to the Pre-Surgical Instructions that were given to them at this PAT appointment. Patient was also educated of the need to review these PAT instructions again prior to his/her surgery.I reviewed the appropriate phone numbers to call if they have any and questions or concerns.

## 2023-11-09 ENCOUNTER — Encounter (HOSPITAL_COMMUNITY)
Admission: RE | Admit: 2023-11-09 | Discharge: 2023-11-09 | Disposition: A | Discharge status: Home / Self Care | Source: Ambulatory Visit | Attending: Orthopaedic Surgery | Admitting: Orthopaedic Surgery

## 2023-11-09 ENCOUNTER — Other Ambulatory Visit: Payer: Self-pay

## 2023-11-09 ENCOUNTER — Encounter (HOSPITAL_COMMUNITY): Payer: Self-pay

## 2023-11-09 VITALS — BP 144/72 | HR 93 | Temp 97.6°F | Resp 20 | Ht 64.5 in | Wt 245.0 lb

## 2023-11-09 DIAGNOSIS — Z01818 Encounter for other preprocedural examination: Secondary | ICD-10-CM | POA: Diagnosis present

## 2023-11-09 DIAGNOSIS — M9262 Juvenile osteochondrosis of tarsus, left ankle: Secondary | ICD-10-CM | POA: Diagnosis not present

## 2023-11-09 DIAGNOSIS — I10 Essential (primary) hypertension: Secondary | ICD-10-CM | POA: Insufficient documentation

## 2023-11-09 DIAGNOSIS — R001 Bradycardia, unspecified: Secondary | ICD-10-CM | POA: Diagnosis not present

## 2023-11-09 DIAGNOSIS — E039 Hypothyroidism, unspecified: Secondary | ICD-10-CM | POA: Insufficient documentation

## 2023-11-09 DIAGNOSIS — G4730 Sleep apnea, unspecified: Secondary | ICD-10-CM | POA: Diagnosis not present

## 2023-11-09 DIAGNOSIS — M62462 Contracture of muscle, left lower leg: Secondary | ICD-10-CM | POA: Insufficient documentation

## 2023-11-09 DIAGNOSIS — M7662 Achilles tendinitis, left leg: Secondary | ICD-10-CM | POA: Diagnosis not present

## 2023-11-09 HISTORY — DX: Cardiac murmur, unspecified: R01.1

## 2023-11-12 NOTE — Progress Notes (Signed)
 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 never smoker with h/o HTN, hypothyroidism, sleep apnea uses CPAP, chronic achilles tendinosis scheduled for above procedure 11/13/2023 with Dr. Lonni Elsa.   Pt seen by PCP 10/26/2023, echo ordered at this visit for evaluation of systolic murmur. This should be completed before elective surgery.  I spoke with nurse at PCP office, they will see if echo can be done before surgery.   Labs from 10/28/2023 in Care Everywhere, wnl.  VS: BP (!) 144/72   Pulse 93   Temp 36.4 C (Oral)   Resp 20   Ht 5' 4.5 (1.638 m)   Wt 111.1 kg   SpO2 99%   BMI 41.40 kg/m   PROVIDERS: Pahwani, Velna SAUNDERS, MD is PCP    LABS: Labs reviewed: Acceptable for surgery. (all labs ordered are listed, but only abnormal results are displayed)  Labs Reviewed - No data to display   IMAGES:   EKG:   CV:  Past Medical History:  Diagnosis Date   Anxiety    Arthritis    OA   Congestion of throat    SINUSES, NONPRODUCTIVE COUGH   Heart murmur    Hypertension    takes meds daily   Hypothyroidism    takes meds daily   Leg pain, right 07/30/2019   Ovarian cancer (HCC) 2002   Ruptured appendicitis 06/03/2011   Sleep apnea    wears CPAP   Squamous cell carcinoma 10/2009   right shin    Past Surgical History:  Procedure Laterality Date   ABDOMINAL HYSTERECTOMY  2002   radical; for ovarian cancer   ABDOMINAL WOUND DEHISCENCE  2002-2003   twice   APPENDECTOMY  06/05/2011    Procedure: APPENDECTOMY;  Surgeon: Lynwood MALVA Pina, MD;  Location: [facility] OR;  Service: General;;   CARPAL TUNNEL RELEASE Right AFTER 2013   CHOLECYSTECTOMY N/A 09/08/2022   Procedure: LAPAROSCOPIC CHOLECYSTECTOMY; FENESTRATION OF HEPATIC CYST;  Surgeon: Dasie Leonor CROME, MD;  Location: [facility] OR;  Service: General;  Laterality: N/A;   I & D KNEE WITH POLY EXCHANGE Left 07/14/2019   Procedure: LEFT KNEE RADICAL IRRIGATION AND DEBRIDEMENT WITH POLY REVISION LEFT KNEE REPLACEMENT;  Surgeon: Liam Lerner, MD;  Location: [facility] ORS;  Service: Orthopedics;  Laterality: Left;   KNEE ARTHROSCOPY  ~ 2007   left   KNEE ARTHROSCOPY  12/2010   right   SQUAMOUS CELL CARCINOMA EXCISION  10/2009   right shin   TOTAL KNEE ARTHROPLASTY  09/22/2011   Procedure: TOTAL KNEE ARTHROPLASTY;  Surgeon: LELON JONETTA Shari Mickey., MD;  Location: [facility] OR;  Service: Orthopedics;  Laterality: Right;   TOTAL KNEE ARTHROPLASTY Left 11/16/2017   Procedure: LEFT TOTAL KNEE ARTHROPLASTY;  Surgeon: Shari Sieving, MD;  Location: [facility] ORS;  Service: Orthopedics;  Laterality: Left;    MEDICATIONS:  acetaminophen  (TYLENOL ) 500 MG tablet   amLODipine  (NORVASC ) 5 MG tablet   docusate sodium  (COLACE) 100 MG capsule   hydrochlorothiazide  (HYDRODIURIL ) 25  MG tablet   KLOR-CON  M20 20 MEQ tablet   levothyroxine  (SYNTHROID , LEVOTHROID) 100 MCG tablet   methocarbamol  (ROBAXIN ) 500 MG tablet   Nasal Dilators (PROVENT SLEEP APNEA THERAPY) MISC   Polyethyl Glycol-Propyl Glycol (SYSTANE OP)   propranolol  (INDERAL ) 20 MG tablet   sertraline  (ZOLOFT ) 100 MG tablet   simvastatin  (ZOCOR ) 20 MG tablet   triamcinolone cream (KENALOG) 0.1 %   No current facility-administered medications for this encounter.    Harlene Hoots Ward, PA-C [facility] Pre-Surgical Testing [phone]

## 2023-11-28 ENCOUNTER — Other Ambulatory Visit: Payer: Self-pay

## 2023-11-28 ENCOUNTER — Encounter (HOSPITAL_COMMUNITY): Payer: Self-pay | Admitting: Orthopaedic Surgery

## 2023-11-28 NOTE — Anesthesia Preprocedure Evaluation (Signed)
 Anesthesia Evaluation  Patient identified by MRN, date of birth, ID band Patient awake    Reviewed: Allergy & Precautions, NPO status , Patient's Chart, lab work & pertinent test results  History of Anesthesia Complications Negative for: history of anesthetic complications  Airway Mallampati: III  TM Distance: <3 FB Neck ROM: Full    Dental  (+) Teeth Intact, Dental Advisory Given   Pulmonary neg shortness of breath, sleep apnea and Continuous Positive Airway Pressure Ventilation , neg COPD, neg recent URI   breath sounds clear to auscultation       Cardiovascular hypertension, Pt. on medications (-) angina (-) Past MI and (-) CHF  Rhythm:Regular     Neuro/Psych  PSYCHIATRIC DISORDERS Anxiety     negative neurological ROS     GI/Hepatic negative GI ROS, Neg liver ROS,,,  Endo/Other  neg diabetesHypothyroidism  Class 3 obesity  Renal/GU      Musculoskeletal  (+) Arthritis ,    Abdominal   Peds  Hematology Lab Results      Component                Value               Date                      WBC                      7.5                 11/29/2023                HGB                      13.4                11/29/2023                HCT                      41.4                11/29/2023                MCV                      88.7                11/29/2023                PLT                      182                 11/29/2023              Anesthesia Other Findings   Reproductive/Obstetrics                              Anesthesia Physical Anesthesia Plan  ASA: 3  Anesthesia Plan: General   Post-op Pain Management: Ofirmev  IV (intra-op)*   Induction: Intravenous  PONV Risk Score and Plan: 4 or greater and Ondansetron  and Dexamethasone   Airway Management Planned: Oral ETT  Additional Equipment: None  Intra-op Plan:   Post-operative Plan: Extubation in OR  Informed Consent: I  have reviewed  the patients History and Physical, chart, labs and discussed the procedure including the risks, benefits and alternatives for the proposed anesthesia with the patient or authorized representative who has indicated his/her understanding and acceptance.     Dental advisory given  Plan Discussed with: CRNA  Anesthesia Plan Comments: (See PAT note 11/09/23 by Harlene Sink, PA-C.  Patient subsequently had echocardiogram 11/23/2023.  Results below.  TTE 11/23/2023: Conclusions: Left ventricle cavity is normal in size. Mild concentric hypertrophy of the left ventricle. Normal global wall motion. Doppler evidence of grade 2 (pseudonormal) diastolic dysfunction. Calculated EF 62%. Left atrial cavity is moderately dilated. Mild (grade 1) mitral regurgitation. Mild mitral valve leaflet thickening. Structurally normal tricuspid valve with mild regurgitation. No evidence of pulmonary hypertension. Structurally normal pulmonic valve with trace regurgitation.  )         Anesthesia Quick Evaluation

## 2023-11-28 NOTE — Progress Notes (Incomplete)
 SDW CALL  Patient was given pre-op instructions over the phone. The opportunity was given for the patient to ask questions. No further questions asked. Patient verbalized understanding of instructions given.   PCP - Vernon Velna SAUNDERS, MD Cardiologist - None  PPM/ICD -  Device Orders -  Rep Notified -   Chest x-ray -  EKG -  Stress Test -  ECHO -  Cardiac Cath -   Sleep Study -  CPAP -   Fasting Blood Sugar -  Checks Blood Sugar _____ times a day  Blood Thinner Instructions: Aspirin  Instructions:  ERAS Protcol -clears until 0430 PRE-SURGERY Ensure or G2-   COVID TEST-    Anesthesia review:   Patient denies shortness of breath, fever, cough and chest pain over the phone call     Oral Hygiene is also important to reduce your risk of infection.  Remember - BRUSH YOUR TEETH THE MORNING OF SURGERY WITH YOUR REGULAR TOOTHPASTE

## 2023-11-29 ENCOUNTER — Ambulatory Visit (HOSPITAL_COMMUNITY)

## 2023-11-29 ENCOUNTER — Ambulatory Visit (HOSPITAL_COMMUNITY)
Admission: RE | Admit: 2023-11-29 | Discharge: 2023-11-29 | Disposition: A | Discharge status: Home / Self Care | Attending: Orthopaedic Surgery | Admitting: Orthopaedic Surgery

## 2023-11-29 ENCOUNTER — Other Ambulatory Visit: Payer: Self-pay

## 2023-11-29 ENCOUNTER — Ambulatory Visit (HOSPITAL_COMMUNITY): Payer: Self-pay | Admitting: Physician Assistant

## 2023-11-29 ENCOUNTER — Other Ambulatory Visit (HOSPITAL_COMMUNITY): Payer: Self-pay

## 2023-11-29 ENCOUNTER — Ambulatory Visit (HOSPITAL_BASED_OUTPATIENT_CLINIC_OR_DEPARTMENT_OTHER): Admitting: Physician Assistant

## 2023-11-29 ENCOUNTER — Encounter (HOSPITAL_COMMUNITY)
Admission: RE | Disposition: A | Discharge status: Home / Self Care | Payer: Self-pay | Source: Home / Self Care | Attending: Orthopaedic Surgery

## 2023-11-29 ENCOUNTER — Encounter (HOSPITAL_COMMUNITY): Payer: Self-pay | Admitting: Orthopaedic Surgery

## 2023-11-29 DIAGNOSIS — M9262 Juvenile osteochondrosis of tarsus, left ankle: Secondary | ICD-10-CM | POA: Diagnosis present

## 2023-11-29 DIAGNOSIS — G4730 Sleep apnea, unspecified: Secondary | ICD-10-CM | POA: Diagnosis not present

## 2023-11-29 DIAGNOSIS — I10 Essential (primary) hypertension: Secondary | ICD-10-CM | POA: Insufficient documentation

## 2023-11-29 DIAGNOSIS — M7662 Achilles tendinitis, left leg: Secondary | ICD-10-CM

## 2023-11-29 DIAGNOSIS — F419 Anxiety disorder, unspecified: Secondary | ICD-10-CM | POA: Diagnosis not present

## 2023-11-29 DIAGNOSIS — E039 Hypothyroidism, unspecified: Secondary | ICD-10-CM | POA: Insufficient documentation

## 2023-11-29 DIAGNOSIS — M62462 Contracture of muscle, left lower leg: Secondary | ICD-10-CM | POA: Insufficient documentation

## 2023-11-29 HISTORY — PX: ACHILLES TENDON SURGERY: SHX542

## 2023-11-29 HISTORY — PX: GASTROCNEMIUS RECESSION: SHX863

## 2023-11-29 LAB — BASIC METABOLIC PANEL WITH GFR
Anion gap: 14 (ref 5–15)
BUN: 12 mg/dL (ref 8–23)
CO2: 23 mmol/L (ref 22–32)
Calcium: 9.6 mg/dL (ref 8.9–10.3)
Chloride: 104 mmol/L (ref 98–111)
Creatinine, Ser: 0.73 mg/dL (ref 0.44–1.00)
GFR, Estimated: >60 mL/min (ref >60)
Glucose, Bld: 92 mg/dL (ref 70–99)
Potassium: 3.8 mmol/L (ref 3.5–5.1)
Sodium: 141 mmol/L (ref 135–145)

## 2023-11-29 LAB — CBC
HCT: 41.4 % (ref 36.0–46.0)
Hemoglobin: 13.4 g/dL (ref 12.0–15.0)
MCH: 28.7 pg (ref 26.0–34.0)
MCHC: 32.4 g/dL (ref 30.0–36.0)
MCV: 88.7 fL (ref 80.0–100.0)
Platelets: 182 K/uL (ref 150–400)
RBC: 4.67 MIL/uL (ref 3.87–5.11)
RDW: 14.2 % (ref 11.5–15.5)
WBC: 7.5 K/uL (ref 4.0–10.5)
nRBC: 0 % (ref 0.0–0.2)

## 2023-11-29 SURGERY — RECONSTRUCTION, TENDON, ACHILLES
Anesthesia: General | Laterality: Left

## 2023-11-29 MED ORDER — POVIDONE-IODINE 10 % EX SWAB
2.0000 | Freq: Once | CUTANEOUS | Status: AC
  Administered 2023-11-29: 2 via TOPICAL

## 2023-11-29 MED ORDER — FENTANYL CITRATE (PF) 250 MCG/5ML IJ SOLN
INTRAMUSCULAR | Status: DC | PRN
  Administered 2023-11-29: 100 ug via INTRAVENOUS
  Administered 2023-11-29 (×2): 50 ug via INTRAVENOUS

## 2023-11-29 MED ORDER — PROPOFOL 10 MG/ML IV BOLUS
INTRAVENOUS | Status: AC
Start: 2023-11-29 — End: 2023-11-29
  Filled 2023-11-29: qty 20

## 2023-11-29 MED ORDER — OXYCODONE HCL 5 MG PO TABS: 5.0000 mg | ORAL_TABLET | Freq: Once | ORAL | Status: DC | PRN

## 2023-11-29 MED ORDER — EPHEDRINE SULFATE-NACL 50-0.9 MG/10ML-% IV SOSY
PREFILLED_SYRINGE | INTRAVENOUS | Status: DC | PRN
  Administered 2023-11-29: 5 mg via INTRAVENOUS

## 2023-11-29 MED ORDER — OXYCODONE HCL 5 MG/5ML PO SOLN: 5.0000 mg | Freq: Once | ORAL | Status: DC | PRN

## 2023-11-29 MED ORDER — FENTANYL CITRATE (PF) 100 MCG/2ML IJ SOLN: 25.0000 ug | INTRAMUSCULAR | Status: DC | PRN

## 2023-11-29 MED ORDER — ORAL CARE MOUTH RINSE: 15.0000 mL | Freq: Once | OROMUCOSAL | Status: AC

## 2023-11-29 MED ORDER — DEXAMETHASONE SOD PHOSPHATE PF 10 MG/ML IJ SOLN
INTRAMUSCULAR | Status: DC | PRN
  Administered 2023-11-29: 5 mg via INTRAVENOUS

## 2023-11-29 MED ORDER — LIDOCAINE 2% (20 MG/ML) 5 ML SYRINGE
INTRAMUSCULAR | Status: DC | PRN
  Administered 2023-11-29: 100 mg via INTRAVENOUS

## 2023-11-29 MED ORDER — FENTANYL CITRATE (PF) 100 MCG/2ML IJ SOLN
INTRAMUSCULAR | Status: AC
  Filled 2023-11-29: qty 2

## 2023-11-29 MED ORDER — CHLORHEXIDINE GLUCONATE 0.12 % MT SOLN
15.0000 mL | Freq: Once | OROMUCOSAL | Status: AC
  Administered 2023-11-29: 15 mL via OROMUCOSAL
  Filled 2023-11-29: qty 15

## 2023-11-29 MED ORDER — ACETAMINOPHEN 10 MG/ML IV SOLN: 1000.0000 mg | Freq: Once | INTRAVENOUS | Status: DC | PRN

## 2023-11-29 MED ORDER — ENOXAPARIN SODIUM 40 MG/0.4ML IJ SOSY
40.0000 mg | PREFILLED_SYRINGE | INTRAMUSCULAR | 0 refills | Status: AC
  Filled 2023-11-29: qty 5.6, 14d supply, fill #0

## 2023-11-29 MED ORDER — LACTATED RINGERS IV SOLN: INTRAVENOUS | Status: DC

## 2023-11-29 MED ORDER — ROCURONIUM BROMIDE 10 MG/ML (PF) SYRINGE
PREFILLED_SYRINGE | INTRAVENOUS | Status: DC | PRN
  Administered 2023-11-29: 60 mg via INTRAVENOUS

## 2023-11-29 MED ORDER — PROPOFOL 10 MG/ML IV BOLUS
INTRAVENOUS | Status: AC
  Filled 2023-11-29: qty 20

## 2023-11-29 MED ORDER — SUGAMMADEX SODIUM 200 MG/2ML IV SOLN
INTRAVENOUS | Status: DC | PRN
  Administered 2023-11-29: 200 mg via INTRAVENOUS

## 2023-11-29 MED ORDER — OXYCODONE HCL 5 MG PO TABS
5.0000 mg | ORAL_TABLET | ORAL | 0 refills | Status: AC | PRN
  Filled 2023-11-29: qty 30, 5d supply, fill #0

## 2023-11-29 MED ORDER — PROPOFOL 10 MG/ML IV BOLUS
INTRAVENOUS | Status: DC | PRN
  Administered 2023-11-29: 150 mg via INTRAVENOUS

## 2023-11-29 MED ORDER — 0.9 % SODIUM CHLORIDE (POUR BTL) OPTIME
TOPICAL | Status: DC | PRN
  Administered 2023-11-29: 1000 mL

## 2023-11-29 MED ORDER — ORAL CARE MOUTH RINSE: 15.0000 mL | Freq: Once | OROMUCOSAL | Status: DC

## 2023-11-29 MED ORDER — ONDANSETRON HCL 4 MG/2ML IJ SOLN
INTRAMUSCULAR | Status: DC | PRN
Start: 2023-11-29 — End: 2023-11-29
  Administered 2023-11-29: 4 mg via INTRAVENOUS

## 2023-11-29 MED ORDER — CHLORHEXIDINE GLUCONATE 0.12 % MT SOLN: 15.0000 mL | Freq: Once | OROMUCOSAL | Status: DC

## 2023-11-29 MED ORDER — CEFAZOLIN SODIUM-DEXTROSE 2-4 GM/100ML-% IV SOLN
2.0000 g | INTRAVENOUS | Status: AC
  Administered 2023-11-29: 2 g via INTRAVENOUS
  Filled 2023-11-29: qty 100

## 2023-11-29 SURGICAL SUPPLY — 62 items
BENZOIN TINCTURE PRP APPL 2/3 (GAUZE/BANDAGES/DRESSINGS) IMPLANT
BLADE AVERAGE 25X9 (BLADE) IMPLANT
BLADE MICRO SAGITTAL (BLADE) ×1 IMPLANT
BLADE SURG 15 STRL LF DISP TIS (BLADE) ×2 IMPLANT
BNDG COMPR ESMARK 6X3 LF (GAUZE/BANDAGES/DRESSINGS) ×1 IMPLANT
BNDG ELASTIC 6INX 5YD STR LF (GAUZE/BANDAGES/DRESSINGS) IMPLANT
CHLORAPREP W/TINT 26 (MISCELLANEOUS) ×2 IMPLANT
COVER BACK TABLE 60X90IN (DRAPES) ×1 IMPLANT
COVER MAYO STAND STRL (DRAPES) IMPLANT
CUFF TRNQT CYL 34X4.125X (TOURNIQUET CUFF) IMPLANT
DRAPE EXTREMITY T 121X128X90 (DISPOSABLE) ×1 IMPLANT
DRAPE IMP U-DRAPE 54X76 (DRAPES) ×1 IMPLANT
DRAPE OEC MINIVIEW 54X84 (DRAPES) ×1 IMPLANT
DRAPE U-SHAPE 47X51 STRL (DRAPES) ×1 IMPLANT
DRSG XEROFORM 1X8 (GAUZE/BANDAGES/DRESSINGS) IMPLANT
ELECTRODE REM PT RTRN 9FT ADLT (ELECTROSURGICAL) ×1 IMPLANT
GAUZE PAD ABD 8X10 STRL (GAUZE/BANDAGES/DRESSINGS) ×1 IMPLANT
GAUZE SPONGE 4X4 12PLY STRL (GAUZE/BANDAGES/DRESSINGS) ×1 IMPLANT
GAUZE XEROFORM 1X8 LF (GAUZE/BANDAGES/DRESSINGS) ×1 IMPLANT
GLOVE BIOGEL M STRL SZ7.5 (GLOVE) ×2 IMPLANT
GLOVE BIOGEL PI IND STRL 8 (GLOVE) ×1 IMPLANT
GOWN STRL REUS W/ TWL LRG LVL3 (GOWN DISPOSABLE) ×1 IMPLANT
GOWN STRL REUS W/ TWL XL LVL3 (GOWN DISPOSABLE) ×1 IMPLANT
IMPL SYS BIOCOMP ACH SPEED (Anchor) IMPLANT
KIT BASIN OR (CUSTOM PROCEDURE TRAY) ×1 IMPLANT
NDL HYPO 22X1.5 SAFETY MO (MISCELLANEOUS) IMPLANT
NDL SUT 6 .5 CRC .975X.05 MAYO (NEEDLE) IMPLANT
NEEDLE HYPO 22X1.5 SAFETY MO (MISCELLANEOUS) IMPLANT
PACK ORTHO EXTREMITY (CUSTOM PROCEDURE TRAY) IMPLANT
PAD ABD 8X10 STRL (GAUZE/BANDAGES/DRESSINGS) IMPLANT
PAD CAST 4YDX4 CTTN HI CHSV (CAST SUPPLIES) ×1 IMPLANT
PADDING CAST ABS COTTON 4X4 ST (CAST SUPPLIES) IMPLANT
PADDING CAST SYNTHETIC 4X4 STR (CAST SUPPLIES) ×4 IMPLANT
PADDING CAST SYNTHETIC 6X4 NS (CAST SUPPLIES) IMPLANT
PENCIL SMOKE EVACUATOR (MISCELLANEOUS) ×1 IMPLANT
SCOTCHCAST PLUS 3X4 WHITE (CAST SUPPLIES) IMPLANT
SCOTCHCAST PLUS 4X4 WHITE (CAST SUPPLIES) ×2 IMPLANT
SHEET MEDIUM DRAPE 40X70 STRL (DRAPES) ×1 IMPLANT
SLEEVE SCD COMPRESS KNEE MED (STOCKING) ×1 IMPLANT
SOLN 0.9% NACL POUR BTL 1000ML (IV SOLUTION) ×1 IMPLANT
SPIKE FLUID TRANSFER (MISCELLANEOUS) IMPLANT
SPLINT FIBERGLASS 4X30 (CAST SUPPLIES) IMPLANT
SPONGE T-LAP 18X18 ~~LOC~~+RFID (SPONGE) ×1 IMPLANT
STOCKINETTE 4X48 STRL (DRAPES) ×1 IMPLANT
STOCKINETTE 6 STRL (DRAPES) ×1 IMPLANT
STRIP CLOSURE SKIN 1/2X4 (GAUZE/BANDAGES/DRESSINGS) IMPLANT
SUCTION TUBE FRAZIER 10FR DISP (SUCTIONS) ×1 IMPLANT
SURGILUBE 2OZ TUBE FLIPTOP (MISCELLANEOUS) IMPLANT
SUT ETHILON 3 0 PS 1 (SUTURE) ×1 IMPLANT
SUT MNCRL AB 3-0 PS2 18 (SUTURE) ×1 IMPLANT
SUT PDS AB 2-0 CT2 27 (SUTURE) ×1 IMPLANT
SUT VIC AB 0 CT1 27XBRD ANBCTR (SUTURE) IMPLANT
SUT VIC AB 2-0 SH 18 (SUTURE) IMPLANT
SUT VIC AB 2-0 SH 27XBRD (SUTURE) IMPLANT
SUT VICRYL 0 SH 27 (SUTURE) IMPLANT
SUTURE FIBERWR 2-0 18 17.9 3/8 (SUTURE) IMPLANT
SUTURE FIBERWR#2 38 REV NDL BL (SUTURE) IMPLANT
SYR BULB EAR ULCER 3OZ GRN STR (SYRINGE) ×1 IMPLANT
SYR CONTROL 10ML LL (SYRINGE) IMPLANT
TOWEL GREEN STERILE FF (TOWEL DISPOSABLE) ×2 IMPLANT
TUBE CONNECTING 20X1/4 (TUBING) ×1 IMPLANT
UNDERPAD 30X36 HEAVY ABSORB (UNDERPADS AND DIAPERS) ×1 IMPLANT

## 2023-11-29 NOTE — H&P (Signed)
 PREOPERATIVE H&P  Chief Complaint: Left posterior ankle pain  HPI: Meredith Rose is a 73 y.o. 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 Symptoms are rated as moderate to severe, and have been worsening.  This is significantly impairing activities of daily living.  She has elected for surgical management.   Past Medical History:  Diagnosis Date   Anxiety    Arthritis    OA   Congestion of throat    SINUSES, NONPRODUCTIVE COUGH   Heart murmur    Hypertension    takes meds daily   Hypothyroidism    takes meds daily   Leg pain, right 07/30/2019   Ovarian cancer (HCC) 2002   Ruptured appendicitis 06/03/2011   Sleep apnea    wears CPAP   Squamous cell carcinoma 10/2009   right shin   Past Surgical History:  Procedure Laterality Date   ABDOMINAL HYSTERECTOMY  2002   radical; for ovarian cancer   ABDOMINAL WOUND DEHISCENCE  2002-2003   twice   APPENDECTOMY  06/05/2011   Procedure: APPENDECTOMY;  Surgeon: Lynwood MALVA Pina, MD;  Location: [facility] OR;  Service: General;;   CARPAL TUNNEL RELEASE Right AFTER 2013   CHOLECYSTECTOMY N/A 09/08/2022   Procedure: LAPAROSCOPIC CHOLECYSTECTOMY; FENESTRATION OF HEPATIC CYST;  Surgeon: Dasie Leonor CROME, MD;  Location: [facility] OR;  Service: General;  Laterality: N/A;   I & D KNEE WITH POLY EXCHANGE Left 07/14/2019   Procedure: LEFT KNEE RADICAL IRRIGATION AND DEBRIDEMENT WITH POLY REVISION LEFT KNEE REPLACEMENT;  Surgeon: Liam Lerner, MD;  Location: [facility] ORS;  Service: Orthopedics;  Laterality: Left;   KNEE ARTHROSCOPY  ~ 2007   left   KNEE ARTHROSCOPY  12/2010   right   SQUAMOUS CELL CARCINOMA EXCISION  10/2009   right shin   TOTAL KNEE ARTHROPLASTY  09/22/2011   Procedure: TOTAL KNEE ARTHROPLASTY;  Surgeon: LELON JONETTA Shari Mickey., MD;  Location: [facility] OR;  Service: Orthopedics;  Laterality: Right;   TOTAL KNEE ARTHROPLASTY Left 11/16/2017    Procedure: LEFT TOTAL KNEE ARTHROPLASTY;  Surgeon: Shari Sieving, MD;  Location: [facility] ORS;  Service: Orthopedics;  Laterality: Left;   Social History   Socioeconomic History   Marital status: Married    Spouse name: Not on file   Number of children: Not on file   Years of education: Not on file   Highest education level: Not on file  Occupational History   Not on file  Tobacco Use   Smoking status: Never   Smokeless tobacco: Never  Vaping Use   Vaping status: Never Used  Substance and Sexual Activity   Alcohol  use: No    Comment: 06/20/11 have drank once or twice in my life   Drug use: No   Sexual activity: Yes  Other Topics Concern   Not on file  Social History Narrative   Not on file   Social Drivers of Health   Financial Resource Strain: Not on file  Food Insecurity: No Food Insecurity (09/08/2022)   Hunger Vital Sign    Worried About Running Out of Food in the Last Year: Never true    Ran Out of Food in the Last Year: Never true  Transportation Needs: No Transportation Needs (09/08/2022)   PRAPARE - Administrator, Civil Service (Medical): No    Lack of Transportation (Non-Medical): No  Physical Activity: Not on file  Stress: Not on file  Social Connections: Not on  file   History reviewed. No pertinent family history. Allergies  Allergen Reactions   Angiotensin Receptor Blockers Cough   Tape Itching    BANDAID OR PAPER TAPE IF LEFT ON TOO LONG    Prior to Admission medications   Medication Sig Start Date End Date Taking? Authorizing Provider  acetaminophen  (TYLENOL ) 500 MG tablet Take 1,000 mg by mouth every 8 (eight) hours as needed for moderate pain (pain score 4-6).   Yes [provider]  amLODipine  (NORVASC ) 5 MG tablet Take 5 mg by mouth daily. 05/12/19  Yes [provider]  hydrochlorothiazide  (HYDRODIURIL ) 25 MG tablet Take 25 mg by mouth daily.   Yes [provider]  KLOR-CON  M20 20 MEQ tablet Take 20 mEq by mouth 2  (two) times daily. 09/22/17  Yes [provider]  levothyroxine  (SYNTHROID , LEVOTHROID) 100 MCG tablet Take 100 mcg by mouth daily before breakfast.   Yes [provider]  Polyethyl Glycol-Propyl Glycol (SYSTANE OP) Place 1 drop into both eyes daily.   Yes [provider]  propranolol  (INDERAL ) 20 MG tablet Take 20 mg by mouth daily.   Yes [provider]  sertraline  (ZOLOFT ) 100 MG tablet Take 100 mg by mouth at bedtime.    Yes [provider]  simvastatin  (ZOCOR ) 20 MG tablet Take 20 mg by mouth at bedtime.    Yes [provider]  docusate sodium  (COLACE) 100 MG capsule Take 1 capsule (100 mg total) by mouth 2 (two) times daily. Patient not taking: Reported on 11/07/2023 09/08/22   Dasie Leonor CROME, MD  methocarbamol  (ROBAXIN ) 500 MG tablet Take 1 tablet (500 mg total) by mouth every 8 (eight) hours as needed for muscle spasms. Patient not taking: Reported on 11/07/2023 09/08/22   Dasie Leonor CROME, MD  Nasal Dilators (PROVENT SLEEP APNEA THERAPY) MISC by Does not apply route. Patient using CPAP machine due to Sleep Apnea.    [provider]  triamcinolone cream (KENALOG) 0.1 % Apply 1 Application topically daily as needed (psoriasis). Patient not taking: Reported on 11/07/2023 04/25/22   [provider]     Positive ROS: All other systems have been reviewed and were otherwise negative with the exception of those mentioned in the HPI and as above.  Physical Exam:  Vitals:   11/29/23 0549  BP: (!) (P) 180/74  Pulse: (!) (P) 58  Resp: (P) 18  Temp: (P) 98.2 F (36.8 C)  SpO2: (P) 97%   General: Alert, no acute distress Cardiovascular: No pedal edema Respiratory: No cyanosis, no use of accessory musculature GI: No organomegaly, abdomen is soft and non-tender Skin: No lesions in the area of chief complaint Neurologic: Sensation intact distally Psychiatric: Patient is competent for consent with normal mood and  affect Lymphatic: No axillary or cervical lymphadenopathy  MUSCULOSKELETAL: Left ankle demonstrates tenderness to palpation at the insertion of the Achilles tendon.  Mild tenderness to palpation with heel squeeze.  No tenderness proximally about the tendon.  Good plantarflexion strength.  Foot is warm and well-perfused with intact sensation distally.  Assessment: Left insertional Achilles tendinitis/tendinopathy with Haglund's deformity enthesophyte formation   Plan: Plan for left Achilles tendon debridement and reconstruction with calcaneal exostectomy.  We discussed the risks, benefits and alternatives of surgery which include but are not limited to wound healing complications, infection, nonunion, malunion, need for further surgery, damage to surrounding structures and continued pain.  They understand there is no guarantees to an acceptable outcome.  After weighing these risks they opted  to proceed with surgery.     Lonni JONELLE Pae, MD    11/29/2023 6:03 AM

## 2023-11-29 NOTE — Transfer of Care (Signed)
 Immediate Anesthesia Transfer of Care Note  Patient: Meredith Rose  Procedure(s) Performed: RECONSTRUCTION, TENDON, ACHILLES (Left) RECESSION, MUSCLE, GASTROCNEMIUS (Left)  Patient Location: PACU  Anesthesia Type:General  Level of Consciousness: awake  Airway & Oxygen Therapy: Patient Spontanous Breathing  Post-op Assessment: Report given to RN and Post -op Vital signs reviewed and stable  Post vital signs: Reviewed and stable  Last Vitals:  Vitals Value Taken Time  BP 131/61 11/29/23 09:45  Temp 36.6 C 11/29/23 09:43  Pulse 55 11/29/23 09:51  Resp 17 11/29/23 09:51  SpO2 93 % 11/29/23 09:51  Vitals shown include unfiled device data.  Last Pain:  Vitals:   11/29/23 0614  TempSrc:   PainSc: 2       Patients Stated Pain Goal: 3 (11/29/23 9385)  Complications: No notable events documented.

## 2023-11-29 NOTE — Anesthesia Procedure Notes (Signed)
 Procedure Name: Intubation Date/Time: 11/29/2023 7:45 AM  Performed by: Virgil Ee, CRNAPre-anesthesia Checklist: Patient identified, Patient being monitored, Timeout performed, Emergency Drugs available and Suction available Patient Re-evaluated:Patient Re-evaluated prior to induction Oxygen Delivery Method: Circle system utilized Preoxygenation: Pre-oxygenation with 100% oxygen Induction Type: IV induction Ventilation: Mask ventilation without difficulty Laryngoscope Size: Mac and 3 Grade View: Grade I Tube type: Oral Tube size: 7.0 mm Number of attempts: 1 Airway Equipment and Method: Stylet Placement Confirmation: ETT inserted through vocal cords under direct vision, positive ETCO2 and breath sounds checked- equal and bilateral Secured at: 22 cm Tube secured with: Tape Dental Injury: Teeth and Oropharynx as per pre-operative assessment

## 2023-11-29 NOTE — Op Note (Signed)
 Meredith Rose female 73 y.o. 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 female had chronic Achilles tendinosis and MRI evidence of significant tendinopathy, Haglund's deformity and bone marrow edema with enthesophyte formation at the insertion of the Achilles.  She failed conservative treatment the form of boot immobilization, anti-inflammatories, activity modifications and home exercise program and was indicated for surgery due to significant pain.   Patient understood the risks, benefits and alternatives to surgery which include but are not limited to wound healing complications, infection, nonunion, malunion, need for further surgery as well as damage to surrounding structures. They also understood the potential for continued pain in that there were no guarantees of acceptable outcome After weighing these risks the patient opted to proceed with surgery.  PROCEDURE: Patient was identified in the preoperative holding area.  Left leg was marked by myself.  She was evaluated by anesthesia and a nerve block was performed.  Consent was signed.  Extremity was marked.  She was taken to the operative suite where general anesthesia was induced without difficulty.  She was then placed in the prone position on the hospital bed using chest rolls and padding's.  All bony prominences were well-padded.   Tourniquet was placed on the left thigh.  Preoperative antibiotics were given.  Left lower extremity was prepped and draped in the usual sterile fashion.  Timeout was performed.  The tourniquet was inflated to 250 mmHg.  Achilles tendon debridement:  We began by making a longitudinal incision along the distal aspect of the Achilles.  He was taken sharply through skin and subcutaneous tissue and the peritenon.  Then the peritenon was elevated from the Achilles tendon.  The Achilles tendon was dissected out.  Then the Achilles tendon was removed from the calcaneus.  There is significant amount of scarring of the distal aspect of the Achilles as well as bony fragmentation within the tendon and enthesophyte formation of the posterior calcaneus.  We then proceeded to debride and remove portions of diseased tendon and calcific portions of the tendon.  Were able to sharply do this with a 15 blade.  The excised and cannulized portion of the tendon were removed.  Then a Cobb elevator was used to perform tenolysis along the course of the tendon up within the sheath more proximally.  It was noted that there was significant amount of tightness of the tendon consistent with gastrocnemius contracture.  Care was taken to preserve as much of the well-appearing tendons possible distally for reconstruction purposes.  Calcaneal exostectomy:  Then using an osteotome the enthesophyte from the posterior aspect of the calcaneus was removed.  Then using the same osteotome the Haglund's deformity was removed in an oblique fashion with care not to violate the subtalar joint.  There was a good bit of cancellous bone.  Using a rondure were able to remove any of the osteophytes and enthesophytes along the lateral medial as well as posterior aspect of the calcaneal tuberosity ensuring that there was no impingement or spikes present.  Fluoroscopy confirmed acceptable bony resection.  Gastrocnemius recession:  Then we tested  the amount of  tendon excursion and its ability to be reattached to the bone.  It was significantly tight and therefore decision for gastrocnemius recession was made.  We turned our attention to the medial leg proximally.  A separate incision was made along the medial leg at the level of the musculotendinous junction and conjoined tendon.  The incision was carried sharply through skin and subcutaneous tissue.  Blunt dissection was used to mobilize skin flaps down to the investing fascia.  The fascia was incised in line with the incision.  In the interval between the gastrocnemius and soleus muscles was identified.  Then once elevated care was taken to transect the gastrocnemius fascia completely.  After transection of the fascia we are able to gain excursion of the distal portion of the tendon down to the calcaneus.  Achilles tendon reconstruction:  We then turned our attention back to the reconstruction.  Speed bridge was used to reconstruct the tendon back to the calcaneus.  This was done through 4 drill tunnels and using suture anchors and suture material.  The tendon was reconstructed back to the calcaneus in with the ankle held in plantarflexion.  There was a good bony and tendon apposition after reconstruction.  Then the ankle was taken through range of motion and there is no gapping at the tendon bone interface.  Then the wounds were irrigated with normal saline.  The peritenon was then closed with a 3-0 Monocryl.  The fascia overlying the gastroc and soleus muscles about the leg were closed with a 2-0 Vicryl suture.  The skin was closed with a 3-0 nylon suture.  Soft dressing was placed.  Tourniquet was released.  Short leg cast was placed.  She was awakened from anesthesia and taken recovery in stable condition.  No complications  POST OPERATIVE INSTRUCTIONS: Patient will be discharged from the PACU Lovenox  for DVT prophylaxis per patient request Nonweightbearing to operative extremity Follow-up in 2 weeks  for cast removal, suture we will if appropriate and placement of a cast No x-rays needed  TOURNIQUET TIME: Less than 2 hours  BLOOD LOSS:  less than 50 mL         DRAINS: none         SPECIMEN: none       COMPLICATIONS:  * No complications entered in OR log *         Disposition: PACU - hemodynamically stable.         Condition: stable

## 2023-11-29 NOTE — Discharge Instructions (Signed)
DR. Susa Simmonds FOOT [facility] ANKLE SURGERY POST-OP INSTRUCTIONS   Pain Management The numbing medicine and your leg will last around 18 hours, take a dose of your pain medicine as soon as you feel it wearing off to avoid rebound pain. Keep your foot elevated above heart level.  Make sure that your heel hangs free ('floats'). Take all prescribed medication as directed. If taking narcotic pain medication you may want to use an over-the-counter stool softener to avoid constipation. You may take over-the-counter NSAIDs (ibuprofen, naproxen, etc.) as well as over-the-counter acetaminophen as directed on the packaging as a supplement for your pain and may also use it to wean away from the prescription medication.  Activity Non-weightbearing Keep cast intact  First Postoperative Visit Your first postop visit will be at least 2 weeks after surgery.  This should be scheduled when you schedule surgery. If you do not have a postoperative visit scheduled please call [phone] to schedule an appointment. At the appointment your incision will be evaluated for suture removal, x-rays will be obtained if necessary.  General Instructions Swelling is very common after foot and ankle surgery.  It often takes 3 months for the foot and ankle to begin to feel comfortable.  Some amount of swelling will persist for 6-12 months. DO NOT change the dressing.  If there is a problem with the dressing (too tight, loose, gets wet, etc.) please contact Dr. Donnie Mesa office. DO NOT get the dressing wet.  For showers you can use an over-the-counter cast cover or wrap a washcloth around the top of your dressing and then cover it with a plastic bag and tape it to your leg. DO NOT soak the incision (no tubs, pools, bath, etc.) until you have approval from Dr. Susa Simmonds.  Contact Dr. Garret Reddish office or go to Emergency Room if: Temperature above 101 F. Increasing pain that is unresponsive to pain medication or elevation Excessive redness or  swelling in your foot Dressing problems - excessive bloody drainage, looseness or tightness, or if dressing gets wet Develop pain, swelling, warmth, or discoloration of your calf

## 2023-11-30 ENCOUNTER — Encounter (HOSPITAL_COMMUNITY): Payer: Self-pay | Admitting: Orthopaedic Surgery

## 2023-12-06 MED ORDER — LIDOCAINE-EPINEPHRINE (PF) 1.5 %-1:200000 IJ SOLN
INTRAMUSCULAR | Status: DC | PRN
  Administered 2023-11-29: 5 mL via PERINEURAL

## 2023-12-06 MED ORDER — BUPIVACAINE-EPINEPHRINE (PF) 0.5% -1:200000 IJ SOLN
INTRAMUSCULAR | Status: DC | PRN
  Administered 2023-11-29: 25 mL via PERINEURAL

## 2023-12-06 NOTE — Anesthesia Procedure Notes (Addendum)
 Anesthesia Regional Block: Popliteal block   Pre-Anesthetic Checklist: , timeout performed,  Correct Patient, Correct Site, Correct Laterality,  Correct Procedure, Correct Position, site marked,  Risks and benefits discussed,  Surgical consent,  Pre-op evaluation,  At surgeon's request and post-op pain management  Laterality: Left and Lower  Prep: chloraprep       Needles:  Injection technique: Single-shot      Needle Length: 9cm  Needle Gauge: 22     Additional Needles: Arrow StimuQuik ECHO Echogenic Stimulating PNB Needle  Procedures:,,,, ultrasound used (permanent image in chart),,    Narrative:  Start time: 11/29/2023 7:21 AM End time: 11/29/2023 7:28 AM Injection made incrementally with aspirations every 5 mL.  Performed by: Personally  Anesthesiologist: Leopoldo Bruckner, MD

## 2023-12-06 NOTE — Anesthesia Postprocedure Evaluation (Signed)
 Anesthesia Post Note  Patient: Meredith Rose  Procedure(s) Performed: RECONSTRUCTION, TENDON, ACHILLES (Left) RECESSION, MUSCLE, GASTROCNEMIUS (Left)     Patient location during evaluation: PACU Anesthesia Type: General and Regional Level of consciousness: awake and alert Pain management: pain level controlled Vital Signs Assessment: post-procedure vital signs reviewed and stable Respiratory status: spontaneous breathing, nonlabored ventilation and respiratory function stable Cardiovascular status: blood pressure returned to baseline and stable Postop Assessment: no apparent nausea or vomiting Anesthetic complications: no   No notable events documented.                Breezie Micucci
